# Patient Record
Sex: Male | Born: 1990 | Race: White | Hispanic: No | Marital: Married | Smoking: Former smoker
Health system: Southern US, Community
[De-identification: ages and names within clinical notes are randomized; demographics above are authoritative.]

## PROBLEM LIST (undated history)

## (undated) ENCOUNTER — Inpatient Hospital Stay: Admission: RE | Payer: No Typology Code available for payment source | Source: Intra-hospital | Admitting: Psychiatry

## (undated) DIAGNOSIS — F1990 Other psychoactive substance use, unspecified, uncomplicated: Secondary | ICD-10-CM

## (undated) DIAGNOSIS — T7840XA Allergy, unspecified, initial encounter: Secondary | ICD-10-CM

## (undated) DIAGNOSIS — F909 Attention-deficit hyperactivity disorder, unspecified type: Secondary | ICD-10-CM

## (undated) DIAGNOSIS — F419 Anxiety disorder, unspecified: Secondary | ICD-10-CM

## (undated) HISTORY — DX: Allergy, unspecified, initial encounter: T78.40XA

---
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
## 2010-07-11 ENCOUNTER — Emergency Department: Payer: Self-pay | Admitting: Emergency Medicine

## 2010-12-22 ENCOUNTER — Emergency Department: Payer: Self-pay

## 2011-12-06 ENCOUNTER — Emergency Department: Payer: Self-pay | Admitting: Emergency Medicine

## 2012-02-28 ENCOUNTER — Emergency Department: Payer: Self-pay | Admitting: Emergency Medicine

## 2012-04-02 ENCOUNTER — Emergency Department (HOSPITAL_COMMUNITY)
Admission: EM | Admit: 2012-04-02 | Discharge: 2012-04-03 | Disposition: A | Discharge status: Home / Self Care | Payer: Self-pay | Attending: Emergency Medicine | Admitting: Emergency Medicine

## 2012-04-02 ENCOUNTER — Encounter (HOSPITAL_COMMUNITY): Payer: Self-pay | Admitting: *Deleted

## 2012-04-02 DIAGNOSIS — F411 Generalized anxiety disorder: Secondary | ICD-10-CM | POA: Insufficient documentation

## 2012-04-02 DIAGNOSIS — F1910 Other psychoactive substance abuse, uncomplicated: Secondary | ICD-10-CM

## 2012-04-02 DIAGNOSIS — F17200 Nicotine dependence, unspecified, uncomplicated: Secondary | ICD-10-CM | POA: Insufficient documentation

## 2012-04-02 DIAGNOSIS — F419 Anxiety disorder, unspecified: Secondary | ICD-10-CM

## 2012-04-02 DIAGNOSIS — F1210 Cannabis abuse, uncomplicated: Secondary | ICD-10-CM | POA: Insufficient documentation

## 2012-04-02 DIAGNOSIS — Z8659 Personal history of other mental and behavioral disorders: Secondary | ICD-10-CM | POA: Insufficient documentation

## 2012-04-02 DIAGNOSIS — F1410 Cocaine abuse, uncomplicated: Secondary | ICD-10-CM | POA: Insufficient documentation

## 2012-04-02 DIAGNOSIS — R443 Hallucinations, unspecified: Secondary | ICD-10-CM | POA: Insufficient documentation

## 2012-04-02 HISTORY — DX: Attention-deficit hyperactivity disorder, unspecified type: F90.9

## 2012-04-02 HISTORY — DX: Anxiety disorder, unspecified: F41.9

## 2012-04-02 LAB — URINALYSIS, ROUTINE W REFLEX MICROSCOPIC
Bilirubin Urine: NEGATIVE
Glucose, UA: 250 mg/dL — AB
Hgb urine dipstick: NEGATIVE
Ketones, ur: NEGATIVE mg/dL
Leukocytes, UA: NEGATIVE
Nitrite: NEGATIVE
Protein, ur: NEGATIVE mg/dL
Specific Gravity, Urine: 1.024 (ref 1.005–1.030)
Urobilinogen, UA: 0.2 mg/dL (ref 0.0–1.0)
pH: 6 (ref 5.0–8.0)

## 2012-04-02 LAB — RAPID URINE DRUG SCREEN, HOSP PERFORMED
Amphetamines: NOT DETECTED
Barbiturates: NOT DETECTED
Benzodiazepines: NOT DETECTED
Cocaine: POSITIVE — AB
Opiates: NOT DETECTED
Tetrahydrocannabinol: POSITIVE — AB

## 2012-04-02 LAB — COMPREHENSIVE METABOLIC PANEL
Albumin: 4.2 g/dL (ref 3.5–5.2)
BUN: 14 mg/dL (ref 6–23)
Calcium: 9.7 mg/dL (ref 8.4–10.5)
GFR calc Af Amer: >90 mL/min (ref >90)
Glucose, Bld: 91 mg/dL (ref 70–99)
Sodium: 132 mEq/L — ABNORMAL LOW (ref 135–145)
Total Protein: 7.5 g/dL (ref 6.0–8.3)

## 2012-04-02 LAB — CBC WITH DIFFERENTIAL/PLATELET
Basophils Absolute: 0.1 K/uL (ref 0.0–0.1)
Basophils Relative: 0 % (ref 0–1)
Eosinophils Absolute: 0.1 10*3/uL (ref 0.0–0.7)
Eosinophils Relative: 1 % (ref 0–5)
HCT: 45.7 % (ref 39.0–52.0)
Hemoglobin: 16.2 g/dL (ref 13.0–17.0)
Lymphocytes Relative: 6 % — ABNORMAL LOW (ref 12–46)
Lymphs Abs: 0.9 10*3/uL (ref 0.7–4.0)
MCH: 32.5 pg (ref 26.0–34.0)
MCHC: 35.4 g/dL (ref 30.0–36.0)
MCV: 91.8 fL (ref 78.0–100.0)
Monocytes Absolute: 0.8 K/uL (ref 0.1–1.0)
Monocytes Relative: 6 % (ref 3–12)
Neutro Abs: 12.4 K/uL — ABNORMAL HIGH (ref 1.7–7.7)
Neutrophils Relative %: 87 % — ABNORMAL HIGH (ref 43–77)
Platelets: 292 10*3/uL (ref 150–400)
RBC: 4.98 MIL/uL (ref 4.22–5.81)
RDW: 12.1 % (ref 11.5–15.5)
WBC: 14.4 K/uL — ABNORMAL HIGH (ref 4.0–10.5)

## 2012-04-02 LAB — COMPREHENSIVE METABOLIC PANEL WITH GFR
ALT: 9 U/L (ref 0–53)
AST: 17 U/L (ref 0–37)
Alkaline Phosphatase: 85 U/L (ref 39–117)
CO2: 28 meq/L (ref 19–32)
Chloride: 96 meq/L (ref 96–112)
Creatinine, Ser: 1.14 mg/dL (ref 0.50–1.35)
GFR calc non Af Amer: >90 mL/min (ref >90)
Potassium: 3.8 meq/L (ref 3.5–5.1)
Total Bilirubin: 0.3 mg/dL (ref 0.3–1.2)

## 2012-04-02 LAB — SALICYLATE LEVEL: Salicylate Lvl: <2 mg/dL — ABNORMAL LOW (ref 2.8–20.0)

## 2012-04-02 LAB — ACETAMINOPHEN LEVEL: Acetaminophen (Tylenol), Serum: <15 ug/mL (ref 10–30)

## 2012-04-02 LAB — ETHANOL: Alcohol, Ethyl (B): <11 mg/dL (ref 0–11)

## 2012-04-02 MED ORDER — NICOTINE 21 MG/24HR TD PT24: 21.0000 mg | MEDICATED_PATCH | Freq: Every day | TRANSDERMAL | Status: DC

## 2012-04-02 MED ORDER — ONDANSETRON HCL 8 MG PO TABS: 4.0000 mg | ORAL_TABLET | Freq: Three times a day (TID) | ORAL | Status: DC | PRN

## 2012-04-02 MED ORDER — LORAZEPAM 1 MG PO TABS
2.0000 mg | ORAL_TABLET | Freq: Once | ORAL | Status: AC
  Administered 2012-04-02: 2 mg via ORAL
  Filled 2012-04-02 (×2): qty 2

## 2012-04-02 MED ORDER — LORAZEPAM 1 MG PO TABS: 1.0000 mg | ORAL_TABLET | Freq: Three times a day (TID) | ORAL | Status: DC | PRN

## 2012-04-02 MED ORDER — IBUPROFEN 200 MG PO TABS: 600.0000 mg | ORAL_TABLET | Freq: Three times a day (TID) | ORAL | Status: DC | PRN

## 2012-04-02 MED ORDER — ALUM & MAG HYDROXIDE-SIMETH 200-200-20 MG/5ML PO SUSP: 30.0000 mL | ORAL | Status: DC | PRN

## 2012-04-02 NOTE — ED Notes (Signed)
Pt. wanded again per security.

## 2012-04-02 NOTE — ED Notes (Signed)
The pt wants to be detoxed from street drugs.  Last use of drugs today he smoked pot.  Last cocaine and others  Xanax vicodin and seroquel 2 days ago. No alcohol

## 2012-04-02 NOTE — ED Notes (Signed)
Pt mother - Faizan Geraci [phone].

## 2012-04-02 NOTE — ED Provider Notes (Addendum)
History     CSN: [id number]  Arrival date & time 04/02/12  1605   First MD Initiated Contact with Patient 04/02/12 1805      Chief Complaint  Patient presents with  . Psychiatric Evaluation    (Consider location/radiation/quality/duration/timing/severity/associated sxs/prior treatment) HPI Comments: Patient with history of bad anxiety as well as ADHD presents with family wishing help from drug abuse. Patient had been binging on cocaine and marijuana. He also smokes cigarettes and drinks occasionally. He reports he is quite high last night and this morning and that he had been having visual hallucinations for the first time over the past couple of days verified by the patient's mother. Patient awoke this morning and reports that he felt like he wished to stop using drugs.  Not having hallucinations now, no SI, HI.  Marland Kitchen  He feels anxious and shaky.  No HA, fevers, chills, coughing.    The history is provided by the patient and a relative.    Past Medical History  Diagnosis Date  . Anxiety   . ADHD (attention deficit hyperactivity disorder)     History reviewed. No pertinent past surgical history.  History reviewed. No pertinent family history.  History  Substance Use Topics  . Smoking status: Current Every Day Smoker  . Smokeless tobacco: Not on file  . Alcohol Use: Yes      Review of Systems  Constitutional: Negative for fever and chills.  HENT: Negative for congestion.   Respiratory: Negative for cough and shortness of breath.   Cardiovascular: Negative for chest pain.  Gastrointestinal: Negative for nausea, vomiting and diarrhea.  Musculoskeletal: Negative for myalgias and back pain.  Psychiatric/Behavioral: Positive for hallucinations. Negative for suicidal ideas. The patient is nervous/anxious.   All other systems reviewed and are negative.    Allergies  Penicillins  Home Medications  No current outpatient prescriptions on file.  BP 115/69  Pulse 92   Temp(Src) 98.4 F (36.9 C) (Oral)  Resp 16  SpO2 99%  Physical Exam  Nursing note and vitals reviewed. Constitutional: He is oriented to person, place, and time. He appears well-developed and well-nourished.  HENT:  Head: Normocephalic and atraumatic.  Eyes: EOM are normal. Pupils are equal, round, and reactive to light.  Neck: Normal range of motion. Neck supple.  Cardiovascular: Normal rate and regular rhythm.   No murmur heard. Pulmonary/Chest: Effort normal. No respiratory distress.  Abdominal: Soft. He exhibits no distension. There is no tenderness.  Musculoskeletal: He exhibits no edema and no tenderness.  Neurological: He is alert and oriented to person, place, and time.  Skin: Skin is warm.  Psychiatric: He has a normal mood and affect.    ED Course  Procedures (including critical care time)  Labs Reviewed  URINALYSIS, ROUTINE W REFLEX MICROSCOPIC - Abnormal; Notable for the following:    Glucose, UA 250 (*)    All other components within normal limits  URINE RAPID DRUG SCREEN (HOSP PERFORMED) - Abnormal; Notable for the following:    Cocaine POSITIVE (*)    Tetrahydrocannabinol POSITIVE (*)    All other components within normal limits  CBC WITH DIFFERENTIAL - Abnormal; Notable for the following:    WBC 14.4 (*)    Neutrophils Relative 87 (*)    Neutro Abs 12.4 (*)    Lymphocytes Relative 6 (*)    All other components within normal limits  COMPREHENSIVE METABOLIC PANEL - Abnormal; Notable for the following:    Sodium 132 (*)    All other  components within normal limits  SALICYLATE LEVEL - Abnormal; Notable for the following:    Salicylate Lvl <2.0 (*)    All other components within normal limits  ETHANOL  ACETAMINOPHEN LEVEL   No results found.   1. Polysubstance abuse   2. Anxiety     Room air saturation is 98% and I interpret this to be normal.  Impression:  polysubstance abuse. Anxiety   MDM   Patient is not suicidal, no longer actively  hallucinating and I do not feel that there is an emergent psychiatric need for admission. His minor withdrawal symptoms I do not feel are a significant medical danger. Initially he was hypertensive and tachycardic I think was more do to his cocaine use and anxiety. I suspect his vital signs were normalized. I will offer him some by mouth Ativan here in emergency department. I spoken to [place] with the ACT team to see if the patient may qualify for inpatient programs versus being discharged for outpatient.  Pt is otherwise medically cleared.          Gavin Pound. Oletta Lamas, MD 04/03/12 Burna Mortimer  Gavin Pound. Oletta Lamas, MD 04/03/12 1610

## 2012-04-02 NOTE — ED Notes (Signed)
Patient must be laying down in order to draw blood.  Draw was attempted but due to patient present with shock, was stopped.  Patient prefers an IV blood draw if an IV is needed.

## 2012-04-02 NOTE — ED Notes (Addendum)
Pt request something for anxiety that is not ativan.  Pt refused to take ativan at this time.

## 2012-04-02 NOTE — ED Notes (Signed)
The pt reports that he was tripping earlier today

## 2012-04-03 NOTE — ED Notes (Signed)
Pt states understanding of discharge instructions 

## 2012-04-03 NOTE — BH Assessment (Signed)
Assessment Note   Manuel Richards is an 22 y.o. male.  Patient reports that he wanted detox from "everything."  Patient has been abusing ETOH, pain pills (xanax, vicodin, percocet, soma), marijuana, cocaine.  Patient says that amounts of the pain pills can vary according to money and availability.  Patient reports that on Thursday, 02/13 he used a lot of cocaine and to help him come off that high he took pain medications.  Patient reports that he does not remember much of the next two days after that.  He said that this was his sign that he needed to get help.  Patient has been using a variety of substances for many years.  He does not qualify at the present time for detox because he has been right at three days from using anything (with exception of ETOH) and he reports no withdrawal symptoms.  Patient has no HI, SI or A/V hallucinations.  Patient does want to apply for a rehabilitation bed at [facility] or [facility].  It was explained to him that those places only do rehab bed applications during business hours on weekdays.  Patient said that he would follow up on referral information given to him.  Patient care was discussed with Dr. Patria Mane and he agreed patient could be discharged. Axis I: Anxiety Disorder NOS and 304.80 Polysubstance dependence Axis II: Deferred Axis III:  Past Medical History  Diagnosis Date  . Anxiety   . ADHD (attention deficit hyperactivity disorder)    Axis IV: economic problems, occupational problems, problems related to legal system/crime and problems related to social environment Axis V: 41-50 serious symptoms  Past Medical History:  Past Medical History  Diagnosis Date  . Anxiety   . ADHD (attention deficit hyperactivity disorder)     History reviewed. No pertinent past surgical history.  Family History: History reviewed. No pertinent family history.  Social History:  reports that he has been smoking.  He does not have any smokeless tobacco history on file. He reports that   drinks alcohol. He reports that he uses illicit drugs (Cocaine and Marijuana).  Additional Social History:  Alcohol / Drug Use Pain Medications: Pt abusing pain medications such as vicodin, percocets, soma, xanax. Prescriptions: Pt is not currently prescribed anything. Over the Counter: Patient denies. History of alcohol / drug use?: Yes Longest period of sobriety (when/how long): When in jail for a few months. Negative Consequences of Use: Personal relationships Substance #1 Name of Substance 1: ETOH 1 - Age of First Use: 22 years of age 51 - Amount (size/oz): One 40 oz per day 1 - Frequency: Daily use 1 - Duration: Last two months at that rate. 1 - Last Use / Amount: 02/15 drank one beer Substance #2 Name of Substance 2: Pain pills which include: soma, vicodine, xanax, percocets 2 - Age of First Use: 22 years old 2 - Amount (size/oz): Amount varies according to funds and availability 2 - Frequency: Will use daily if available 2 - Duration: Last 5 years 2 - Last Use / Amount: 02/13 Substance #3 Name of Substance 3: Cocaine 3 - Age of First Use: 22 years of age 7 - Amount (size/oz): One half to one gram every 1-2 days 3 - Frequency: Every 1-2 days 3 - Duration: Last two months 3 - Last Use / Amount: 02/13 Substance #4 Name of Substance 4: Marijuana 4 - Age of First Use: 22 years of age 70 - Amount (size/oz): 2 grams per day 4 - Frequency: Daily use 4 -  Duration: 3 years 4 - Last Use / Amount: 02/13  CIWA: CIWA-Ar BP: 115/69 mmHg Pulse Rate: 92 COWS:    Allergies:  Allergies  Allergen Reactions  . Penicillins Other (See Comments)    Unknown- reaction many years ago    Home Medications:  (Not in a hospital admission)  OB/GYN Status:  No LMP for male patient.  General Assessment Data Location of Assessment: [facility] ED ACT Assessment: Yes Living Arrangements: Parent (Lives with mother) Can pt return to current living arrangement?: Yes Admission Status: Voluntary Is  patient capable of signing voluntary admission?: Yes Transfer from: Acute Hospital Referral Source: Self/Family/Friend     Risk to self Suicidal Ideation: No Suicidal Intent: No Is patient at risk for suicide?: No Suicidal Plan?: No Access to Means: No What has been your use of drugs/alcohol within the last 12 months?: Daily use of substances Previous Attempts/Gestures: No How many times?: 0 Other Self Harm Risks: SA issues Triggers for Past Attempts: None known Intentional Self Injurious Behavior: None Family Suicide History: No Recent stressful life event(s): Other (Comment) (Pt could not identify a single stressor) Persecutory voices/beliefs?: Yes Depression: Yes Depression Symptoms: Despondent;Insomnia;Isolating;Loss of interest in usual pleasures;Feeling worthless/self pity Substance abuse history and/or treatment for substance abuse?: Yes Suicide prevention information given to non-admitted patients: Not applicable  Risk to Others Homicidal Ideation: No Thoughts of Harm to Others: No Current Homicidal Intent: No Current Homicidal Plan: No Access to Homicidal Means: No Identified Victim: No one History of harm to others?: No Assessment of Violence: None Noted Violent Behavior Description: None Does patient have access to weapons?: No Criminal Charges Pending?: Yes Describe Pending Criminal Charges:  Possession of THC Does patient have a court date: Yes Court Date: 04/07/12  Psychosis Hallucinations: None noted Delusions: None noted  Mental Status Report Appear/Hygiene: Disheveled Eye Contact: Fair Motor Activity: Freedom of movement;Restlessness Speech: Logical/coherent Level of Consciousness: Quiet/awake Mood: Anxious;Sad Affect: Anxious;Depressed Anxiety Level: Severe Thought Processes: Coherent;Relevant Judgement: Impaired Orientation: Person;Place;Time;Situation Obsessive Compulsive Thoughts/Behaviors: Minimal  Cognitive Functioning Concentration:  Decreased Memory: Recent Impaired;Remote Intact IQ: Average Insight: Fair Impulse Control: Poor Appetite: Poor Weight Loss: 0 Weight Gain: 0 Sleep: Decreased Total Hours of Sleep:  (<4H/D) Vegetative Symptoms: None  ADLScreening [facility] Assessment Services) Patient's cognitive ability adequate to safely complete daily activities?: Yes Patient able to express need for assistance with ADLs?: Yes Independently performs ADLs?: Yes (appropriate for developmental age)  Abuse/Neglect [facility]) Physical Abuse: Denies Verbal Abuse: Denies Sexual Abuse: Denies  Prior Inpatient Therapy Prior Inpatient Therapy: No Prior Therapy Dates: None Prior Therapy Facilty/Provider(s): None Reason for Treatment: None  Prior Outpatient Therapy Prior Outpatient Therapy: No Prior Therapy Dates: N/A Prior Therapy Facilty/Provider(s): N/A Reason for Treatment: N/A  ADL Screening (condition at time of admission) Patient's cognitive ability adequate to safely complete daily activities?: Yes Patient able to express need for assistance with ADLs?: Yes Independently performs ADLs?: Yes (appropriate for developmental age) Weakness of Legs: None Weakness of Arms/Hands: None  Home Assistive Devices/Equipment Home Assistive Devices/Equipment: None    Abuse/Neglect Assessment (Assessment to be complete while patient is alone) Physical Abuse: Denies Verbal Abuse: Denies Sexual Abuse: Denies Exploitation of patient/patient's resources: Denies Self-Neglect: Denies     Merchant navy officer (For Healthcare) Advance Directive: Patient does not have advance directive;Patient would not like information    Additional Information 1:1 In Past 12 Months?: No CIRT Risk: No Elopement Risk: No Does patient have medical clearance?: Yes     Disposition:  Disposition Disposition of Patient:  Outpatient treatment;Referred to Type of outpatient treatment: Adult Patient referred to: [facility] (Pt to contact on Monday  for rehab bed availability.)  On Site Evaluation by:   Reviewed with Physician:  Dr. Stanford Breed, Berna Spare Ray 04/03/2012 4:48 AM

## 2012-06-28 ENCOUNTER — Emergency Department: Payer: Self-pay | Admitting: Emergency Medicine

## 2012-09-29 ENCOUNTER — Emergency Department: Payer: Self-pay | Admitting: Emergency Medicine

## 2013-01-06 ENCOUNTER — Emergency Department: Payer: Self-pay | Admitting: Emergency Medicine

## 2013-01-06 LAB — CBC WITH DIFFERENTIAL/PLATELET
Eosinophil #: 0.1 10*3/uL (ref 0.0–0.7)
Eosinophil %: 0.7 %
HCT: 44.5 % (ref 40.0–52.0)
Lymphocyte %: 8.3 %
Monocyte #: 1.4 x10 3/mm — ABNORMAL HIGH (ref 0.2–1.0)
Monocyte %: 8.1 %
Neutrophil #: 14.2 10*3/uL — ABNORMAL HIGH (ref 1.4–6.5)
RBC: 4.84 10*6/uL (ref 4.40–5.90)
RDW: 12.9 % (ref 11.5–14.5)
WBC: 17.3 10*3/uL — ABNORMAL HIGH (ref 3.8–10.6)

## 2013-01-06 LAB — URINALYSIS, COMPLETE
Bacteria: NONE SEEN
Bilirubin,UR: NEGATIVE
Blood: NEGATIVE
Glucose,UR: NEGATIVE mg/dL (ref 0–75)
Ketone: NEGATIVE
Nitrite: NEGATIVE
Ph: 6 (ref 4.5–8.0)

## 2013-01-06 LAB — COMPREHENSIVE METABOLIC PANEL
Alkaline Phosphatase: 103 U/L
Anion Gap: 8 (ref 7–16)
Chloride: 104 mmol/L (ref 98–107)
Co2: 27 mmol/L (ref 21–32)
Creatinine: 0.95 mg/dL (ref 0.60–1.30)
EGFR (Non-African Amer.): >60
Glucose: 101 mg/dL — ABNORMAL HIGH (ref 65–99)
SGOT(AST): 18 U/L (ref 15–37)
SGPT (ALT): 22 U/L (ref 12–78)

## 2013-01-06 LAB — DRUG SCREEN, URINE
Amphetamines, Ur Screen: NEGATIVE (ref ?–1000)
Benzodiazepine, Ur Scrn: NEGATIVE (ref ?–200)
Cannabinoid 50 Ng, Ur ~~LOC~~: POSITIVE (ref ?–50)
Cocaine Metabolite,Ur ~~LOC~~: NEGATIVE (ref ?–300)
Methadone, Ur Screen: NEGATIVE (ref ?–300)
Opiate, Ur Screen: NEGATIVE (ref ?–300)

## 2013-01-06 LAB — MONONUCLEOSIS SCREEN: Mono Test: NEGATIVE

## 2013-01-09 LAB — BETA STREP CULTURE(ARMC)

## 2013-02-19 ENCOUNTER — Emergency Department: Payer: Self-pay | Admitting: Internal Medicine

## 2013-02-19 LAB — RAPID INFLUENZA A&B ANTIGENS (ARMC ONLY)

## 2013-03-16 ENCOUNTER — Emergency Department: Payer: Self-pay | Admitting: Internal Medicine

## 2013-04-30 ENCOUNTER — Emergency Department: Payer: Self-pay | Admitting: Emergency Medicine

## 2013-05-01 ENCOUNTER — Emergency Department: Payer: Self-pay | Admitting: Emergency Medicine

## 2014-01-29 ENCOUNTER — Emergency Department: Payer: Self-pay | Admitting: Emergency Medicine

## 2014-12-18 ENCOUNTER — Emergency Department
Admission: EM | Admit: 2014-12-18 | Discharge: 2014-12-19 | Disposition: A | Discharge status: Other Acute Inpatient Hospital | Payer: No Typology Code available for payment source | Attending: Emergency Medicine | Admitting: Emergency Medicine

## 2014-12-18 ENCOUNTER — Emergency Department: Payer: Self-pay

## 2014-12-18 ENCOUNTER — Encounter: Payer: Self-pay | Admitting: Emergency Medicine

## 2014-12-18 DIAGNOSIS — F1110 Opioid abuse, uncomplicated: Secondary | ICD-10-CM | POA: Insufficient documentation

## 2014-12-18 DIAGNOSIS — F1210 Cannabis abuse, uncomplicated: Secondary | ICD-10-CM | POA: Insufficient documentation

## 2014-12-18 DIAGNOSIS — F1220 Cannabis dependence, uncomplicated: Secondary | ICD-10-CM

## 2014-12-18 DIAGNOSIS — F1120 Opioid dependence, uncomplicated: Secondary | ICD-10-CM

## 2014-12-18 DIAGNOSIS — R4182 Altered mental status, unspecified: Secondary | ICD-10-CM | POA: Insufficient documentation

## 2014-12-18 DIAGNOSIS — F19951 Other psychoactive substance use, unspecified with psychoactive substance-induced psychotic disorder with hallucinations: Secondary | ICD-10-CM

## 2014-12-18 DIAGNOSIS — R61 Generalized hyperhidrosis: Secondary | ICD-10-CM | POA: Insufficient documentation

## 2014-12-18 DIAGNOSIS — Z880 Allergy status to penicillin: Secondary | ICD-10-CM | POA: Insufficient documentation

## 2014-12-18 DIAGNOSIS — Z720 Tobacco use: Secondary | ICD-10-CM | POA: Insufficient documentation

## 2014-12-18 DIAGNOSIS — F1910 Other psychoactive substance abuse, uncomplicated: Secondary | ICD-10-CM

## 2014-12-18 DIAGNOSIS — F29 Unspecified psychosis not due to a substance or known physiological condition: Secondary | ICD-10-CM | POA: Insufficient documentation

## 2014-12-18 DIAGNOSIS — F23 Brief psychotic disorder: Secondary | ICD-10-CM

## 2014-12-18 DIAGNOSIS — F1121 Opioid dependence, in remission: Secondary | ICD-10-CM

## 2014-12-18 DIAGNOSIS — R000 Tachycardia, unspecified: Secondary | ICD-10-CM | POA: Insufficient documentation

## 2014-12-18 LAB — URINALYSIS COMPLETE WITH MICROSCOPIC (ARMC ONLY)
Bacteria, UA: NONE SEEN
Bilirubin Urine: NEGATIVE
GLUCOSE, UA: NEGATIVE mg/dL
Leukocytes, UA: NEGATIVE
Nitrite: NEGATIVE
PROTEIN: 100 mg/dL — AB
Specific Gravity, Urine: 1.03 (ref 1.005–1.030)
pH: 6 (ref 5.0–8.0)

## 2014-12-18 LAB — CBC
HCT: 48.1 % (ref 40.0–52.0)
Hemoglobin: 16.3 g/dL (ref 13.0–18.0)
MCH: 31.1 pg (ref 26.0–34.0)
MCHC: 34 g/dL (ref 32.0–36.0)
MCV: 91.5 fL (ref 80.0–100.0)
PLATELETS: 382 10*3/uL (ref 150–440)
RBC: 5.25 MIL/uL (ref 4.40–5.90)
RDW: 12.6 % (ref 11.5–14.5)
WBC: 13.4 10*3/uL — ABNORMAL HIGH (ref 3.8–10.6)

## 2014-12-18 LAB — ACETAMINOPHEN LEVEL

## 2014-12-18 LAB — URINE DRUG SCREEN, QUALITATIVE (ARMC ONLY)
AMPHETAMINES, UR SCREEN: NOT DETECTED
BENZODIAZEPINE, UR SCRN: NOT DETECTED
Barbiturates, Ur Screen: NOT DETECTED
CANNABINOID 50 NG, UR ~~LOC~~: POSITIVE — AB
Cocaine Metabolite,Ur ~~LOC~~: NOT DETECTED
MDMA (ECSTASY) UR SCREEN: NOT DETECTED
Methadone Scn, Ur: NOT DETECTED
Opiate, Ur Screen: POSITIVE — AB
PHENCYCLIDINE (PCP) UR S: NOT DETECTED
TRICYCLIC, UR SCREEN: NOT DETECTED

## 2014-12-18 LAB — COMPREHENSIVE METABOLIC PANEL
ALBUMIN: 5.3 g/dL — AB (ref 3.5–5.0)
ALK PHOS: 71 U/L (ref 38–126)
ALT: 17 U/L (ref 17–63)
AST: 22 U/L (ref 15–41)
Anion gap: 11 (ref 5–15)
BUN: 21 mg/dL — ABNORMAL HIGH (ref 6–20)
CALCIUM: 9.7 mg/dL (ref 8.9–10.3)
CO2: 24 mmol/L (ref 22–32)
CREATININE: 1 mg/dL (ref 0.61–1.24)
Chloride: 102 mmol/L (ref 101–111)
GFR calc Af Amer: >60 mL/min (ref >60)
GFR calc non Af Amer: >60 mL/min (ref >60)
GLUCOSE: 147 mg/dL — AB (ref 65–99)
Potassium: 3.4 mmol/L — ABNORMAL LOW (ref 3.5–5.1)
SODIUM: 137 mmol/L (ref 135–145)
Total Bilirubin: 1 mg/dL (ref 0.3–1.2)
Total Protein: 8.4 g/dL — ABNORMAL HIGH (ref 6.5–8.1)

## 2014-12-18 LAB — SALICYLATE LEVEL

## 2014-12-18 LAB — ETHANOL

## 2014-12-18 MED ORDER — DIPHENHYDRAMINE HCL 50 MG/ML IJ SOLN
INTRAMUSCULAR | Status: AC
  Administered 2014-12-18: 50 mg
  Filled 2014-12-18: qty 1

## 2014-12-18 MED ORDER — SODIUM CHLORIDE 0.9 % IV SOLN
Freq: Once | INTRAVENOUS | Status: DC
Start: 2014-12-18 — End: 2014-12-18

## 2014-12-18 MED ORDER — LORAZEPAM 2 MG/ML IJ SOLN
INTRAMUSCULAR | Status: AC
  Administered 2014-12-18: 2 mg
  Filled 2014-12-18: qty 1

## 2014-12-18 MED ORDER — SODIUM CHLORIDE 0.9 % IV SOLN
1000.0000 mL | Freq: Once | INTRAVENOUS | Status: AC
  Administered 2014-12-18: 1000 mL via INTRAVENOUS

## 2014-12-18 MED ORDER — HALOPERIDOL LACTATE 5 MG/ML IJ SOLN
INTRAMUSCULAR | Status: AC
  Administered 2014-12-18: 5 mg
  Filled 2014-12-18: qty 1

## 2014-12-18 NOTE — ED Provider Notes (Signed)
[facility] Emergency Department Provider Note     Time seen: ----------------------------------------- 6:00 PM on 12/18/2014 -----------------------------------------  L5 caveat: Review of systems and history is difficult to obtain due to altered mental status  I have reviewed the triage vital signs and the nursing notes.   HISTORY  Chief Complaint Altered Mental Status    HPI Manuel Richards is a 24 y.o. male who presents ER for altered mental status. According to the girlfriend he's been acting where for days, reports he slept all day Friday and Saturday he woke up not making much sense. Girlfriend reports she's been getting worse every day, he is nonverbal on arrival here with twitching.   Past Medical History  Diagnosis Date  . Anxiety   . ADHD (attention deficit hyperactivity disorder)     There are no active problems to display for this patient.   History reviewed. No pertinent past surgical history.  Allergies Penicillins  Social History Social History  Substance Use Topics  . Smoking status: Current Every Day Smoker  . Smokeless tobacco: None  . Alcohol Use: Yes    Review of Systems Unknown at this time   ____________________________________________   PHYSICAL EXAM:  VITAL SIGNS: ED Triage Vitals  Enc Vitals Group     BP 12/18/14 1739 161/112 mmHg     Pulse Rate 12/18/14 1739 109     Resp 12/18/14 1739 16     Temp 12/18/14 1739 98.1 F (36.7 C)     Temp Source 12/18/14 1739 Oral     SpO2 12/18/14 1739 97 %     Weight 12/18/14 1739 120 lb (54.432 kg)     Height 12/18/14 1739 5\' 8"  (1.727 m)     Head Cir --      Peak Flow --      Pain Score --      Pain Loc --      Pain Edu? --      Excl. in GC? --     Constitutional: Alert but disoriented. Patient is nonverbal Eyes: Conjunctivae are normal. PERRL. Normal extraocular movements. ENT   Head: Normocephalic and atraumatic.   Nose: No  congestion/rhinnorhea.   Mouth/Throat: Mucous membranes are moist. Generalized poor dentition   Neck: No stridor. Cardiovascular: Rapid rate, regular rhythm. Normal and symmetric distal pulses are present in all extremities. No murmurs, rubs, or gallops. Respiratory: Normal respiratory effort without tachypnea nor retractions. Breath sounds are clear and equal bilaterally. No wheezes/rales/rhonchi. Gastrointestinal: Soft and nontender. No distention. No abdominal bruits.  Musculoskeletal: Nontender with normal range of motion in all extremities. No joint effusions.  No lower extremity tenderness nor edema. Neurologic:  Patient follows commands, but has resting tremor, eye fluttering, is nonverbal at this time. Skin:  Skin is warm, diaphoresis is noted. Psychiatric: Mood and behavior are abnormal. Patient remains nonverbal.  ____________________________________________  ED COURSE:  Pertinent labs & imaging results that were available during my care of the patient were reviewed by me and considered in my medical decision making (see chart for details). Altered mental status of unclear etiology. This likely drug-induced. ____________________________________________    LABS (pertinent positives/negatives)  Labs Reviewed  COMPREHENSIVE METABOLIC PANEL - Abnormal; Notable for the following:    Potassium 3.4 (*)    Glucose, Bld 147 (*)    BUN 21 (*)    Total Protein 8.4 (*)    Albumin 5.3 (*)    All other components within normal limits  CBC - Abnormal; Notable for  the following:    WBC 13.4 (*)    All other components within normal limits  URINALYSIS COMPLETEWITH MICROSCOPIC ([facility] ONLY) - Abnormal; Notable for the following:    Color, Urine YELLOW (*)    APPearance CLOUDY (*)    Ketones, ur 1+ (*)    Hgb urine dipstick 1+ (*)    Protein, ur 100 (*)    Squamous Epithelial / LPF 0-5 (*)    All other components within normal limits  URINE DRUG SCREEN, QUALITATIVE ([facility] ONLY) -  Abnormal; Notable for the following:    Opiate, Ur Screen POSITIVE (*)    Cannabinoid 50 Ng, Ur [place] POSITIVE (*)    All other components within normal limits  ACETAMINOPHEN LEVEL - Abnormal; Notable for the following:    Acetaminophen (Tylenol), Serum <10 (*)    All other components within normal limits  ETHANOL  SALICYLATE LEVEL    RADIOLOGY Images were viewed by me  CT head IMPRESSION: Diffuse mastoid opacification on the left. Mastoids on the right are clear. There is no intracranial mass, hemorrhage, or focal gray -white compartment lesions/acute appearing infarct. ____________________________________________  FINAL ASSESSMENT AND PLAN  Altered mental status, acute psychosis, polysubstance abuse  Plan: Patient with labs and imaging as dictated above. Patient appears to be acutely psychotic, patient thinks she is pregnant currently. He has not gotten back to his baseline according to his girlfriend. He also uses synthetic marijuana which may be the cause of his symptoms. He has been involuntarily committed, psychiatry consult was pending.   Emily Filbert, MD   Emily Filbert, MD 12/18/14 [phone]

## 2014-12-18 NOTE — ED Notes (Signed)
Patient assigned to appropriate care area. Patient oriented to unit/care area: Informed that, for their safety, care areas are designed for safety and monitored by security cameras at all times; and visiting hours explained to patient. Patient verbalizes understanding, and verbal contract for safety obtained, however the Pt is very psychotic and RN believes that he is unable to understand.

## 2014-12-18 NOTE — ED Notes (Signed)
Pt's girlfriend reports pt has been acting weird for a few days, reports pt slept all day Friday and then Saturday woke up not making much sense. Reports pt has been getting worse every day. Pt nonverbal in triage, eventually responds to pain. Pt with eyes twitching fast, quick hand movements. Girlfriend denies any drug use besides marijuana, denies alcohol, denies any psych hx.

## 2014-12-18 NOTE — ED Notes (Signed)
Pt transported to the BHU without any difficulty.  

## 2014-12-18 NOTE — ED Notes (Signed)
Mike at bedside to change pt out to Marketing executivepsych attire.

## 2014-12-18 NOTE — ED Notes (Signed)
Pt had a psychotic episode where he was speaking in incomplete sentences, word salad and flight of ideas. Pt was getting agitated therefore Roberts GaudyHenry RN called ED MD to request medication to prevent the Pt from hurting himself. Medication was administered and Roberts GaudyHenry RN stayed with the Pt in his room until the Pt started to calm down.

## 2014-12-19 ENCOUNTER — Inpatient Hospital Stay
Admission: EM | Admit: 2014-12-19 | Discharge: 2014-12-24 | DRG: 897 | Disposition: A | Discharge status: Home / Self Care | Payer: No Typology Code available for payment source | Source: Intra-hospital | Attending: Psychiatry | Admitting: Psychiatry

## 2014-12-19 DIAGNOSIS — F19951 Other psychoactive substance use, unspecified with psychoactive substance-induced psychotic disorder with hallucinations: Secondary | ICD-10-CM | POA: Diagnosis present

## 2014-12-19 DIAGNOSIS — F17201 Nicotine dependence, unspecified, in remission: Secondary | ICD-10-CM | POA: Diagnosis present

## 2014-12-19 DIAGNOSIS — F1121 Opioid dependence, in remission: Secondary | ICD-10-CM | POA: Diagnosis present

## 2014-12-19 DIAGNOSIS — F19959 Other psychoactive substance use, unspecified with psychoactive substance-induced psychotic disorder, unspecified: Principal | ICD-10-CM | POA: Diagnosis present

## 2014-12-19 DIAGNOSIS — F909 Attention-deficit hyperactivity disorder, unspecified type: Secondary | ICD-10-CM | POA: Diagnosis present

## 2014-12-19 DIAGNOSIS — N390 Urinary tract infection, site not specified: Secondary | ICD-10-CM | POA: Diagnosis present

## 2014-12-19 DIAGNOSIS — F1120 Opioid dependence, uncomplicated: Secondary | ICD-10-CM | POA: Diagnosis not present

## 2014-12-19 DIAGNOSIS — R410 Disorientation, unspecified: Secondary | ICD-10-CM | POA: Diagnosis present

## 2014-12-19 DIAGNOSIS — Z7289 Other problems related to lifestyle: Secondary | ICD-10-CM | POA: Diagnosis not present

## 2014-12-19 DIAGNOSIS — F17200 Nicotine dependence, unspecified, uncomplicated: Secondary | ICD-10-CM | POA: Diagnosis present

## 2014-12-19 DIAGNOSIS — Z880 Allergy status to penicillin: Secondary | ICD-10-CM

## 2014-12-19 DIAGNOSIS — R441 Visual hallucinations: Secondary | ICD-10-CM | POA: Diagnosis present

## 2014-12-19 DIAGNOSIS — F1220 Cannabis dependence, uncomplicated: Secondary | ICD-10-CM | POA: Diagnosis present

## 2014-12-19 DIAGNOSIS — F202 Catatonic schizophrenia: Secondary | ICD-10-CM | POA: Diagnosis present

## 2014-12-19 DIAGNOSIS — G4700 Insomnia, unspecified: Secondary | ICD-10-CM | POA: Diagnosis present

## 2014-12-19 LAB — LIPID PANEL
CHOLESTEROL: 182 mg/dL (ref 0–200)
HDL: 41 mg/dL (ref >40)
LDL CALC: 129 mg/dL — AB (ref 0–99)
TRIGLYCERIDES: 62 mg/dL (ref <150)
Total CHOL/HDL Ratio: 4.4 RATIO
VLDL: 12 mg/dL (ref 0–40)

## 2014-12-19 LAB — TSH: TSH: 0.922 u[IU]/mL (ref 0.350–4.500)

## 2014-12-19 MED ORDER — LORAZEPAM 2 MG/ML IJ SOLN
2.0000 mg | INTRAMUSCULAR | Status: DC | PRN
  Filled 2014-12-19: qty 1

## 2014-12-19 MED ORDER — ALUM & MAG HYDROXIDE-SIMETH 200-200-20 MG/5ML PO SUSP: 30.0000 mL | ORAL | Status: DC | PRN

## 2014-12-19 MED ORDER — HALOPERIDOL 0.5 MG PO TABS
1.0000 mg | ORAL_TABLET | Freq: Four times a day (QID) | ORAL | Status: DC
  Administered 2014-12-19: 1 mg via ORAL
  Filled 2014-12-19: qty 2

## 2014-12-19 MED ORDER — DIPHENHYDRAMINE HCL 50 MG/ML IJ SOLN
INTRAMUSCULAR | Status: AC
  Administered 2014-12-19: 50 mg
  Filled 2014-12-19: qty 1

## 2014-12-19 MED ORDER — LOPERAMIDE HCL 2 MG PO CAPS: 2.0000 mg | ORAL_CAPSULE | ORAL | Status: DC | PRN

## 2014-12-19 MED ORDER — LOPERAMIDE HCL 2 MG PO CAPS
ORAL_CAPSULE | ORAL | Status: AC
  Administered 2014-12-19: 2 mg via ORAL
  Filled 2014-12-19: qty 1

## 2014-12-19 MED ORDER — LORAZEPAM 2 MG/ML IJ SOLN: 2.0000 mg | INTRAMUSCULAR | Status: DC | PRN

## 2014-12-19 MED ORDER — LOPERAMIDE HCL 2 MG PO CAPS
2.0000 mg | ORAL_CAPSULE | Freq: Once | ORAL | Status: AC
  Administered 2014-12-19: 2 mg via ORAL
  Filled 2014-12-19: qty 1

## 2014-12-19 MED ORDER — LOPERAMIDE HCL 2 MG PO CAPS
2.0000 mg | ORAL_CAPSULE | Freq: Once | ORAL | Status: DC
Start: 2014-12-19 — End: 2014-12-19
  Administered 2014-12-19: 2 mg via ORAL

## 2014-12-19 MED ORDER — ACETAMINOPHEN 325 MG PO TABS: 650.0000 mg | ORAL_TABLET | Freq: Four times a day (QID) | ORAL | Status: DC | PRN

## 2014-12-19 MED ORDER — ZIPRASIDONE MESYLATE 20 MG IM SOLR
INTRAMUSCULAR | Status: AC
  Filled 2014-12-19: qty 20

## 2014-12-19 MED ORDER — HALOPERIDOL 0.5 MG PO TABS
1.0000 mg | ORAL_TABLET | Freq: Four times a day (QID) | ORAL | Status: DC
  Administered 2014-12-19 – 2014-12-20 (×3): 1 mg via ORAL
  Filled 2014-12-19 (×4): qty 2

## 2014-12-19 MED ORDER — ZIPRASIDONE MESYLATE 20 MG IM SOLR
20.0000 mg | Freq: Once | INTRAMUSCULAR | Status: AC
  Administered 2014-12-19: 20 mg via INTRAMUSCULAR

## 2014-12-19 MED ORDER — LORAZEPAM 1 MG PO TABS
1.0000 mg | ORAL_TABLET | Freq: Four times a day (QID) | ORAL | Status: DC
  Administered 2014-12-19 – 2014-12-20 (×3): 1 mg via ORAL
  Filled 2014-12-19 (×3): qty 1

## 2014-12-19 MED ORDER — DIPHENHYDRAMINE HCL 50 MG/ML IJ SOLN
50.0000 mg | INTRAMUSCULAR | Status: DC | PRN
  Administered 2014-12-20 (×2): 50 mg via INTRAMUSCULAR
  Filled 2014-12-19 (×2): qty 1

## 2014-12-19 MED ORDER — LORAZEPAM 1 MG PO TABS
1.0000 mg | ORAL_TABLET | Freq: Four times a day (QID) | ORAL | Status: DC
  Administered 2014-12-19: 1 mg via ORAL
  Filled 2014-12-19: qty 1

## 2014-12-19 MED ORDER — MAGNESIUM HYDROXIDE 400 MG/5ML PO SUSP: 30.0000 mL | Freq: Every day | ORAL | Status: DC | PRN

## 2014-12-19 NOTE — ED Notes (Signed)
ED BHU PLACEMENT JUSTIFICATION Is the patient under IVC or is there intent for IVC: Yes.   Is the patient medically cleared: Yes.   Is there vacancy in the ED BHU: Yes.   Is the population mix appropriate for patient: Yes.   Is the patient awaiting placement in inpatient or outpatient setting: No. Has the patient had a psychiatric consult: No. Survey of unit performed for contraband, proper placement and condition of furniture, tampering with fixtures in bathroom, shower, and each patient room: Yes.  ; Findings:  APPEARANCE/BEHAVIOR Pt presents as actively psychotic with A/V hallucinations. Pt makes odd gestures to no one in particular. Pt noted to be talking to himself. Pt ambulates around his room and ventures into other patient's rooms. Pt intermittently can answer staff with questions with both appropriate and inappropriate answers.  NEURO ASSESSMENT Orientation: Pt unable to answer questions, states that is is 2012.  Hallucinations: Yes.  Auditory Hallucinations, Visual Hallucinations and Tactile Hallucinations Speech: Normal Gait: normal RESPIRATORY ASSESSMENT Normal expansion.  Clear to auscultation.  No rales, rhonchi, or wheezing. CARDIOVASCULAR ASSESSMENT regular rate and rhythm, S1, S2 normal, no murmur, click, rub or gallop GASTROINTESTINAL ASSESSMENT soft, nontender, BS WNL, no r/g EXTREMITIES normal strength, tone, and muscle mass PLAN OF CARE Provide calm/safe environment. Vital signs assessed twice daily. ED BHU Assessment once each 12-hour shift. Collaborate with intake RN daily or as condition indicates. Assure the ED provider has rounded once each shift. Provide and encourage hygiene. Provide redirection as needed. Assess for escalating behavior; address immediately and inform ED provider.  Assess family dynamic and appropriateness for visitation as needed: Yes.  ; If necessary, describe findings:  Educate the patient/family about BHU procedures/visitation: Yes.  ; If  necessary, describe findings:

## 2014-12-19 NOTE — ED Notes (Signed)
Patient assigned to appropriate care area. Patient oriented to unit/care area: Informed that, for their safety, care areas are designed for safety and monitored by security cameras at all times; and visiting hours explained to patient. Patient verbalizes understanding, and verbal contract for safety obtained. 

## 2014-12-19 NOTE — Progress Notes (Signed)
Dystonia, Rigid, akathisia, stiffness of muscle, shaking and tense in muscles; assisted up to sitting position, Diphenhydramine 50 mg IM, Left gluteal given for EPS. Will continue to monitor responsiveness to medication.

## 2014-12-19 NOTE — ED Notes (Signed)
NAD Noted at this time. Pt pacing around his room. Pt cooperative and easily re-directed by staff. Pt continues to hallucinate. Will continue with 15 min safety checks.

## 2014-12-19 NOTE — Progress Notes (Addendum)
Admission note:  D; patient admitted to the unit for schizophreniform catatonia.  Patient was brought in by girlfriend.  Patient is having visual and auditory hallucinations at this time.  Patient is unable to express any information needed for the admission.  Patient has brief moments of clarity.  Patient has a hx of substance abuse.  Patient has a hx of smoking.  Patient is disheveled and presenting with a flat affect.  Patient has no history of SI or HI.  Patient is compliant with medications prescribed at this time. Patient wondering the milieu.  Patient takes several times to redirect.  Patient in no distress at this time. A: skin and belongings search completed with no contraband found.  Patient shown around the unit.  Medications given as prescribed.  q 15 min checks done.  R: patient no receptive of information and shows no evidence of learning.

## 2014-12-19 NOTE — ED Notes (Signed)
Pt urinated on a conner of the room. Sherilyn CooterHenry RN and ED tech cleaned the room and changed the Pt's clothing. Pt was place on bed.

## 2014-12-19 NOTE — ED Notes (Signed)
NO physical distress noted at this time. Pt sitting up on side of bed, continues to have conversations with himself. Will continue to monitor with 14 min safety rounds.

## 2014-12-19 NOTE — ED Notes (Signed)
Pt currently sitting on side of bed and talking to himself. A/V hallucinations noted at this time. Will continue to monitor with 15 minute safety checks.

## 2014-12-19 NOTE — ED Notes (Signed)
Pt noted to be sitting on edge of bed at this time. Visualized in no physical distress. Pt noted to be rubbing his pants as if to get something off, sometimes able to answer staff questions and sometimes unable to. Pt calm and at this time.

## 2014-12-19 NOTE — Progress Notes (Signed)
Untwined now, ambulating freely around in his room, looking at himself in the mirror and spitting at his own image, talking to himself, nonsensical, disorganized, confused and disoriented; Clinical research associatewriter with patient for safety.

## 2014-12-19 NOTE — BH Assessment (Signed)
Assessment Note  Manuel Richards is an 24 y.o. male. who presented voluntarily to [facility] ED for bizarre behavior for the past couple of days . Pt appears to be responding to internal stimuli both auditory and visual.  Pt stated his recent for admission to the hospital, "I thought my wife was going to have these babies, but now I have to have them. They are in the front and the back."  When asked if he was married, Pt stated, "No, I've got a girlfriend."  During the assessment, the Pt appeared to be looking for something under the covers and on his hands.  Pt would not respond to several of the questions, he would just give a blank stare.  Pt made several comments that were unable to be understood by the writer, his voice was low and the words were not recognizable.   Pt denies any mental health history. Pt denies suicidal thoughts.  Pt denies any self injurious behaviors. Pt denies homicidal ideation or history of violence. Pt denies any history of auditory or visual hallucinations, however it  Appears he is responding to A and V hallucinations. Pt denies any current substance use and/or abuse, however his labs showed positive for Cannabinoids and Opiates.   Pt is dressed in hospital scrubs, not oriented to self, place, time or situation. Pt thought the year was 2012.  Pt's exhibited tremors in his eye lids and hands during assessment and was restless. Eye contact is poor, Pt would just blankly stare past the Clinical research associate. Pt's mood is unable to be assessed and anxious and affect preoccupied. Thought process is incoherent and irrelevant.   Diagnosis: Psychosis   Past Medical History:  Past Medical History  Diagnosis Date  . Anxiety   . ADHD (attention deficit hyperactivity disorder)     History reviewed. No pertinent past surgical history.  Family History: No family history on file.  Social History:  reports that he has been smoking.  He does not have any smokeless tobacco history on file.  He reports that he drinks alcohol. He reports that he uses illicit drugs (Cocaine and Marijuana).  Additional Social History:  Alcohol / Drug Use Pain Medications: Unable to Assess Prescriptions: Unable to Assess Over the Counter: Unable to Assess Longest period of sobriety (when/how long): Unable to Assess  CIWA: CIWA-Ar BP: 139/85 mmHg Pulse Rate: 96 COWS:    Allergies:  Allergies  Allergen Reactions  . Penicillins Other (See Comments)    Reaction:  Unknown; childhood reaction     Home Medications:  (Not in a hospital admission)  OB/GYN Status:  No LMP for male patient.  General Assessment Data Location of Assessment: [facility] ED TTS Assessment: In system Is this a Tele or Face-to-Face Assessment?: Face-to-Face Is this an Initial Assessment or a Re-assessment for this encounter?: Initial Assessment Marital status: Single Maiden name: N/a Is patient pregnant?: No Pregnancy Status: No Living Arrangements: Spouse/significant other Can pt return to current living arrangement?: Yes Admission Status: Voluntary Referral Source: Self/Family/Friend Insurance type: None     Crisis Care Plan Living Arrangements: Spouse/significant other Name of Psychiatrist: None reported Name of Therapist: None reported  Education Status Is patient currently in school?: No Current Grade: N/a Highest grade of school patient has completed: 102 Name of school: N/a  Risk to self with the past 6 months Suicidal Ideation: No Has patient been a risk to self within the past 6 months prior to admission? : No Suicidal Intent: No Has patient had  any suicidal intent within the past 6 months prior to admission? : No Is patient at risk for suicide?: No Suicidal Plan?: No Has patient had any suicidal plan within the past 6 months prior to admission? : No Access to Means: No What has been your use of drugs/alcohol within the last 12 months?: Pt would not respond Previous Attempts/Gestures: No How  many times?:  (None reported) Other Self Harm Risks: None reported Triggers for Past Attempts: Unknown Intentional Self Injurious Behavior: None Family Suicide History: Unknown Recent stressful life event(s): Other (Comment) (Unknown) Persecutory voices/beliefs?: No Depression: No Substance abuse history and/or treatment for substance abuse?: No (Unable to assess ) Suicide prevention information given to non-admitted patients: Not applicable  Risk to Others within the past 6 months Homicidal Ideation: No Does patient have any lifetime risk of violence toward others beyond the six months prior to admission? : Unknown Thoughts of Harm to Others: No Current Homicidal Intent: No Current Homicidal Plan: No Access to Homicidal Means: No Identified Victim: None reported History of harm to others?: No Assessment of Violence: None Noted Violent Behavior Description: None reported Does patient have access to weapons?: No Criminal Charges Pending?: No Does patient have a court date: No Is patient on probation?: No  Psychosis Hallucinations: Auditory, Visual Delusions: Somatic  Mental Status Report Appearance/Hygiene: Bizarre, Disheveled Eye Contact: Poor Motor Activity: Tremors, Restlessness Speech: Word salad Level of Consciousness: Restless, Quiet/awake Mood: Other (Comment) (Unable to assess) Affect: Preoccupied Anxiety Level: None Thought Processes: Flight of Ideas Judgement: Impaired Orientation: Not oriented Obsessive Compulsive Thoughts/Behaviors: Unable to Assess  Cognitive Functioning Concentration: Poor Memory: Unable to Assess IQ: Average Insight: Poor Impulse Control: Unable to Assess Appetite: Poor Weight Loss: 0 Weight Gain: 0 Sleep: Unable to Assess Total Hours of Sleep: 0 Vegetative Symptoms: None  ADLScreening [facility] Assessment Services) Patient's cognitive ability adequate to safely complete daily activities?: No Patient able to express need for  assistance with ADLs?: No Independently performs ADLs?: Yes (appropriate for developmental age)  Prior Inpatient Therapy Prior Inpatient Therapy: No  Prior Outpatient Therapy Prior Outpatient Therapy: No Does patient have an ACCT team?: Unknown Does patient have Intensive In-House Services?  : Unknown Does patient have [facility] services? : Unknown Does patient have P4CC services?: Unknown  ADL Screening (condition at time of admission) Patient's cognitive ability adequate to safely complete daily activities?: No Patient able to express need for assistance with ADLs?: No Independently performs ADLs?: Yes (appropriate for developmental age)       Abuse/Neglect Assessment (Assessment to be complete while patient is alone) Physical Abuse:  (Unable to Assess) Verbal Abuse:  (Unable to Assess) Sexual Abuse:  (Unable to Assess) Exploitation of patient/patient's resources:  (Unable to Assess) Self-Neglect:  (Unable to Assess) Possible abuse reported to::  (Unable to Assess) Values / Beliefs Cultural Requests During Hospitalization: None Spiritual Requests During Hospitalization: None Consults Spiritual Care Consult Needed: No Social Work Consult Needed: No Merchant navy officerAdvance Directives (For Healthcare) Does patient have an advance directive?: No Would patient like information on creating an advanced directive?: No - patient declined information    Additional Information 1:1 In Past 12 Months?: No CIRT Risk: No Elopement Risk: No Does patient have medical clearance?: Yes     Disposition:  Disposition Initial Assessment Completed for this Encounter: Yes Disposition of Patient: Referred to Patient referred to: Other (Comment) (Psych MD Consult)  On Site Evaluation by:   Reviewed with Physician:    Ramon DredgeHannah S Amit Leece 12/19/2014 9:23 AM

## 2014-12-19 NOTE — Consult Note (Signed)
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 Marijuana abuse [F12.10] 12/19/2014    Total Time spent with patient: 45 minutes  Subjective:   Manuel Richards is a 24 y.o. male patient admitted with patient himself is really unable to give any meaningful information at this time.Marland Kitchen  HPI:  Information from the patient to a limited degree, from the chart including the old chart and from speaking with his mother who has observed the current clinical situation. Patient has had behavioral health evaluations in years past and those were reviewed. Mother tells me that patient has been in his current clinical condition for about 3 days. Prior to that reportedly he was in a normal state of health and mental health. Reportedly he just stopped speaking and started acting bizarrely about 3 days ago. Mother and girlfriend both report this. Girlfriend who lives with him claims that he has not been abusing drugs as far she knows. There is no reported information so far about any kind of trauma or illness. The patient himself really doesn't give any reliable information. He seemed to indicate that he was not using drugs but I couldn't really be sure. Reportedly he has told staff here that he is having hallucinations and hearing voices. He sort of vaguely knotted about it when I ask him but couldn't give me any more data.  Social history: Patient lives with his girlfriend. Apparently he does have a job and usually works regularly. His mother is his closest  biological relatives as far as we can tell and also has seen him in the last couple days and been in to visit him here.  Medical history: Patient has no known medical problems no history known of high blood pressure diabetes seizures.  Substance abuse history: About 2 years ago he was evaluated for "detox from everything". The mother and girlfriend report that they don't think that he's been abusing any drugs recently. His drug screen is positive for opiates and marijuana. Patient isn't able to give any kind of information. He does indicate that he has not been drinking.  Current medications: None  Past Psychiatric History: No known past psychiatric admissions or psychiatric hospitalization. He had been treated for ADHD in the past but that doesn't appear to be an active treatment problem. He was evaluated in the past by counselors for what he called detox from everything. According to the family he has not to their knowledge been using substances recently. Mother reports that she does not know of him ever having an episode similar to what he is having now.  Risk to Self: Suicidal Ideation: No Suicidal Intent: No Is patient at risk for suicide?: No Suicidal Plan?: No Access to Means: No What has been your use of drugs/alcohol within the last 12 months?: Pt would not respond How many times?:  (None reported) Other Self Harm Risks: None reported Triggers for Past Attempts: Unknown Intentional Self Injurious Behavior: None Risk to Others: Homicidal Ideation: No Thoughts of Harm to Others: No Current Homicidal Intent: No Current Homicidal Plan: No Access to Homicidal Means: No  Identified Victim: None reported History of harm to others?: No Assessment of Violence: None Noted Violent Behavior Description: None reported Does patient have access to weapons?: No Criminal Charges Pending?: No Does patient have a court date: No Prior Inpatient Therapy: Prior Inpatient Therapy: No Prior  Outpatient Therapy: Prior Outpatient Therapy: No Does patient have an ACCT team?: Unknown Does patient have Intensive In-House Services?  : Unknown Does patient have [facility] services? : Unknown Does patient have P4CC services?: Unknown  Past Medical History:  Past Medical History  Diagnosis Date  . Anxiety   . ADHD (attention deficit hyperactivity disorder)    History reviewed. No pertinent past surgical history. Family History: No family history on file. Family Psychiatric  History: Mother tells me that there is a family history of bipolar disorder but denies any other family history including substance abuse Social History:  History  Alcohol Use  . Yes     History  Drug Use  . Yes  . Special: Cocaine, Marijuana    Social History   Social History  . Marital Status: Single    Spouse Name: N/A  . Number of Children: N/A  . Years of Education: N/A   Social History Main Topics  . Smoking status: Current Every Day Smoker  . Smokeless tobacco: None  . Alcohol Use: Yes  . Drug Use: Yes    Special: Cocaine, Marijuana  . Sexual Activity: Not Asked   Other Topics Concern  . None   Social History Narrative   Additional Social History:    Pain Medications: Unable to Assess Prescriptions: Unable to Assess Over the Counter: Unable to Assess Longest period of sobriety (when/how long): Unable to Assess                     Allergies:   Allergies  Allergen Reactions  . Penicillins Other (See Comments)    Reaction:  Unknown; childhood reaction     Labs:  Results for orders placed or performed during the hospital encounter of 12/18/14 (from the past 48 hour(s))  Comprehensive metabolic panel     Status: Abnormal   Collection Time: 12/18/14  5:41 PM  Result Value Ref Range   Sodium 137 135 - 145 mmol/L   Potassium 3.4 (L) 3.5 - 5.1 mmol/L   Chloride 102 101 - 111 mmol/L   CO2 24 22 - 32 mmol/L   Glucose, Bld 147 (H) 65 - 99 mg/dL   BUN 21 (H) 6 - 20 mg/dL    Creatinine, Ser 1.00 0.61 - 1.24 mg/dL   Calcium 9.7 8.9 - 10.3 mg/dL   Total Protein 8.4 (H) 6.5 - 8.1 g/dL   Albumin 5.3 (H) 3.5 - 5.0 g/dL   AST 22 15 - 41 U/L   ALT 17 17 - 63 U/L   Alkaline Phosphatase 71 38 - 126 U/L   Total Bilirubin 1.0 0.3 - 1.2 mg/dL   GFR calc non Af Amer >60 >60 mL/min   GFR calc Af Amer >60 >60 mL/min    Comment: (NOTE) The eGFR has been calculated using the CKD EPI equation. This calculation has not been validated in all clinical situations. eGFR's persistently <60 mL/min signify possible Chronic Kidney Disease.    Anion gap 11 5 - 15  CBC     Status: Abnormal   Collection Time: 12/18/14  5:41 PM  Result Value Ref Range   WBC 13.4 (H) 3.8 - 10.6 K/uL   RBC 5.25 4.40 - 5.90 MIL/uL  Hemoglobin 16.3 13.0 - 18.0 g/dL   HCT 48.1 40.0 - 52.0 %   MCV 91.5 80.0 - 100.0 fL   MCH 31.1 26.0 - 34.0 pg   MCHC 34.0 32.0 - 36.0 g/dL   RDW 12.6 11.5 - 14.5 %   Platelets 382 150 - 440 K/uL  Ethanol     Status: None   Collection Time: 12/18/14  5:41 PM  Result Value Ref Range   Alcohol, Ethyl (B) <5 <5 mg/dL    Comment:        LOWEST DETECTABLE LIMIT FOR SERUM ALCOHOL IS 5 mg/dL FOR MEDICAL PURPOSES ONLY   Acetaminophen level     Status: Abnormal   Collection Time: 12/18/14  5:41 PM  Result Value Ref Range   Acetaminophen (Tylenol), Serum <10 (L) 10 - 30 ug/mL    Comment:        THERAPEUTIC CONCENTRATIONS VARY SIGNIFICANTLY. A RANGE OF 10-30 ug/mL MAY BE AN EFFECTIVE CONCENTRATION FOR MANY PATIENTS. HOWEVER, SOME ARE BEST TREATED AT CONCENTRATIONS OUTSIDE THIS RANGE. ACETAMINOPHEN CONCENTRATIONS >150 ug/mL AT 4 HOURS AFTER INGESTION AND >50 ug/mL AT 12 HOURS AFTER INGESTION ARE OFTEN ASSOCIATED WITH TOXIC REACTIONS.   Salicylate level     Status: None   Collection Time: 12/18/14  5:41 PM  Result Value Ref Range   Salicylate Lvl <4.6 2.8 - 30.0 mg/dL  Urinalysis complete, with microscopic     Status: Abnormal   Collection Time: 12/18/14  7:33  PM  Result Value Ref Range   Color, Urine YELLOW (A) YELLOW   APPearance CLOUDY (A) CLEAR   Glucose, UA NEGATIVE NEGATIVE mg/dL   Bilirubin Urine NEGATIVE NEGATIVE   Ketones, ur 1+ (A) NEGATIVE mg/dL   Specific Gravity, Urine 1.030 1.005 - 1.030   Hgb urine dipstick 1+ (A) NEGATIVE   pH 6.0 5.0 - 8.0   Protein, ur 100 (A) NEGATIVE mg/dL   Nitrite NEGATIVE NEGATIVE   Leukocytes, UA NEGATIVE NEGATIVE   RBC / HPF 6-30 0 - 5 RBC/hpf   WBC, UA 6-30 0 - 5 WBC/hpf   Bacteria, UA NONE SEEN NONE SEEN   Squamous Epithelial / LPF 0-5 (A) NONE SEEN   Mucous PRESENT   Urine Drug Screen, Qualitative ([facility] only)     Status: Abnormal   Collection Time: 12/18/14  7:33 PM  Result Value Ref Range   Tricyclic, Ur Screen NONE DETECTED NONE DETECTED   Amphetamines, Ur Screen NONE DETECTED NONE DETECTED   MDMA (Ecstasy)Ur Screen NONE DETECTED NONE DETECTED   Cocaine Metabolite,Ur [place] NONE DETECTED NONE DETECTED   Opiate, Ur Screen POSITIVE (A) NONE DETECTED   Phencyclidine (PCP) Ur S NONE DETECTED NONE DETECTED   Cannabinoid 50 Ng, Ur [place] POSITIVE (A) NONE DETECTED   Barbiturates, Ur Screen NONE DETECTED NONE DETECTED   Benzodiazepine, Ur Scrn NONE DETECTED NONE DETECTED   Methadone Scn, Ur NONE DETECTED NONE DETECTED    Comment: (NOTE) 803  Tricyclics, urine               Cutoff 1000 ng/mL 200  Amphetamines, urine             Cutoff 1000 ng/mL 300  MDMA (Ecstasy), urine           Cutoff 500 ng/mL 400  Cocaine Metabolite, urine       Cutoff 300 ng/mL 500  Opiate, urine                   Cutoff 300 ng/mL 600  Phencyclidine (PCP), urine      Cutoff 25 ng/mL 700  Cannabinoid, urine              Cutoff 50 ng/mL 800  Barbiturates, urine             Cutoff 200 ng/mL 900  Benzodiazepine, urine           Cutoff 200 ng/mL 1000 Methadone, urine                Cutoff 300 ng/mL 1100 1200 The urine drug screen provides only a preliminary, unconfirmed 1300 analytical test result and should not be used for  non-medical 1400 purposes. Clinical consideration and professional judgment should 1500 be applied to any positive drug screen result due to possible 1600 interfering substances. A more specific alternate chemical method 1700 must be used in order to obtain a confirmed analytical result.  1800 Gas chromato graphy / mass spectrometry (GC/MS) is the preferred 1900 confirmatory method.     Current Facility-Administered Medications  Medication Dose Route Frequency Provider Last Rate Last Dose  . haloperidol (HALDOL) tablet 1 mg  1 mg Oral Q6H John T Clapacs, MD      . LORazepam (ATIVAN) tablet 1 mg  1 mg Oral Q6H Gonzella Lex, MD       No current outpatient prescriptions on file.    Musculoskeletal: Strength & Muscle Tone: within normal limits Gait & Station: shuffle Patient leans: N/A  Psychiatric Specialty Exam: Review of Systems  Unable to perform ROS   Blood pressure 125/89, pulse 108, temperature 98.7 F (37.1 C), temperature source Oral, resp. rate 18, height '5\' 8"'  (1.727 m), weight 54.432 kg (120 lb), SpO2 98 %.Body mass index is 18.25 kg/(m^2).  General Appearance: Disheveled  Eye Contact::  Minimal  Speech:  Garbled and Slow  Volume:  Decreased  Mood:  Unable to state  Affect:  Flat  Thought Process:  Very disorganized and almost absent during my admission.  Orientation:  Negative  Thought Content:  Hallucinations: Auditory  Suicidal Thoughts:  No  Homicidal Thoughts:  For both suicidal and homicidal ideation the patient seemed to shake his head to indicate no but again I don't know that this is really reliable. We don't have any evidence of any suicidal or homicidal behavior  Memory:  Negative Could not do any testing  Judgement:  Negative  Insight:  Negative  Psychomotor Activity:  Restlessness  Concentration:  Poor  Recall:  Poor  Fund of Knowledge:Poor  Language: Poor  Akathisia:  It's possible. He is a little bit restless and he did get some Haldol  earlier but I don't think we can clearly say this is akathisia  Handed:  Right  AIMS (if indicated):     Assets:  Physical Health Social Support  ADL's:  Intact  Cognition: Impaired,  Mild  Sleep:      Treatment Plan Summary: Daily contact with patient to assess and evaluate symptoms and progress in treatment, Medication management and Plan 24 year old man presents with what appears to be a slightly agitated catatonia. He is not communicative and appears to be very blank in his thought and behavior. A lot of his movements show repeated mannerisms. Apparently this is been going on only for a few days and there is no reported prior history of mental illness except for substance abuse. Drug screen positive for marijuana and opiates. Patient not able to give any more history. 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 Could be substance abuse could be early schizophrenia could be mood disorder. No evidence of medical problem. He will be admitted to the psychiatry ward downstairs. 15 minute checks in place. Check labs including hemoglobin A1c lipid panel and TSH. I am starting him on standing modest doses of Haldol and Ativan for his current condition. Team downstairs can work on further treatment. I spoke with his mother on the phone and advised her that he was likely to be admitted to the hospital.  Disposition: Recommend psychiatric Inpatient admission when medically cleared.  John Clapacs 12/19/2014 2:53 PM

## 2014-12-19 NOTE — Plan of Care (Signed)
Problem: Ineffective individual coping Goal: STG: Patient will remain free from self harm Outcome: Not Progressing Medications administered as ordered by the physician, x1 IM 50 mg Benadryl given for EPS, patient responded well to medication, no PRN given, unable to understand education provided regarding medications Therapeutic Effects, SEs and Adverse effects as discussed, questions encouraged, responses nonsensical and irrelevant due to Psychosis; 1:1 observations maintained for safety; clinical and moral support provided, patient encouraged to continue to express feelings and demonstrate safe care. Patient remain free from harm, will continue to monitor.

## 2014-12-19 NOTE — ED Notes (Signed)
Pt had to be redirected and oriented to his room on multiple ocasions.

## 2014-12-19 NOTE — BH Specialist Note (Signed)
TTS unable to complete assessment. Patient is agitated and responding to internal stimuli.  He is very confused and is having a hard time acclimating himself to his surroundings.

## 2014-12-19 NOTE — Progress Notes (Signed)
Intrusive, difficult to redirect confused, Responding to I/S, Vivid Auditory Hallucinating, Wandering to other patient's room, risk of harm from others imminent. Medication given now per nurses' clinical judgement. Patient has not had any medication except for imodium. Room closer to the nurses' station, will monitor closely to prevent this patient from Harm/Injury. VSS, medicated, will monitor responsiveness to medication.

## 2014-12-19 NOTE — [facility] Counselor (Signed)
Writer spoke to Bank of New York CompanyPt's nurse, Aundra MilletMegan, RN about assessment. Pt is not in a place to talk at this moment. TTS will attempt to assess later on today.  Anne ShutterHannah Alyn Riedinger, LPCA Therapeutic Triage Specialist

## 2014-12-19 NOTE — ED Notes (Signed)
Despite being heavily medicated, Pt still up with visual and auditory hallucinations. Pt has intervals of 3-15 min of sleep then wakes up.

## 2014-12-19 NOTE — Tx Team (Signed)
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  The patient and family have been given the opportunity to ask questions and make suggestions.  Crissie ReeseHeather L Cornell Bourbon 12/19/2014, 6:01 PM

## 2014-12-19 NOTE — ED Notes (Signed)
Pt continues to lay in bed with active A/V hallucinations at this time. Respirations are noted to be even and unlabored at this time. Pt is calm with staff. Will continue with 15 min safety checks.

## 2014-12-19 NOTE — ED Notes (Signed)
Pt presents alert and oriented at this time. Pt able to answer questions correctly and appropriately with staff. Pt states the month and the year and that he is at a hospital. Pt able to tell staff that he is no longer having visual hallucinations but states that he is having auditory hallucinations with upwards of 15 voices in his head that are "just talking" but are not commanding him to do anything.

## 2014-12-19 NOTE — ED Notes (Signed)
NAD noted at this time. Pt sitting up on side of bed talking to the wall. Respirations even and unlabored at this time. Will continue with 15 min safety rounds.

## 2014-12-19 NOTE — ED Notes (Signed)
Pt resting in bed at this time. Pt continues to have A/V hallucinations at this time. Pt calm and cooperative with staff. Will continue 15 min safety rounds.

## 2014-12-19 NOTE — ED Notes (Signed)

## 2014-12-19 NOTE — Progress Notes (Signed)
Pulling down window blinds in his room and attempting to climb the window, Clinical research associatewriter with this patient now to prevent injury!

## 2014-12-20 DIAGNOSIS — F202 Catatonic schizophrenia: Secondary | ICD-10-CM

## 2014-12-20 LAB — HEMOGLOBIN A1C: Hgb A1c MFr Bld: 5.6 % (ref 4.0–6.0)

## 2014-12-20 MED ORDER — HALOPERIDOL LACTATE 2 MG/ML PO CONC
1.0000 mg | Freq: Four times a day (QID) | ORAL | Status: DC
  Administered 2014-12-20: 1 mg via ORAL
  Filled 2014-12-20 (×8): qty 0.5

## 2014-12-20 MED ORDER — LORAZEPAM 2 MG/ML IJ SOLN
2.0000 mg | INTRAMUSCULAR | Status: DC | PRN
  Administered 2014-12-20: 2 mg via INTRAMUSCULAR
  Filled 2014-12-20: qty 1

## 2014-12-20 MED ORDER — LORAZEPAM 2 MG/ML IJ SOLN
2.0000 mg | INTRAMUSCULAR | Status: DC | PRN
  Administered 2014-12-20 (×2): 2 mg via INTRAMUSCULAR
  Filled 2014-12-20: qty 1

## 2014-12-20 MED ORDER — LORAZEPAM 2 MG PO TABS: 2.0000 mg | ORAL_TABLET | ORAL | Status: DC | PRN

## 2014-12-20 NOTE — Progress Notes (Signed)
AV/H continues, roaming around freely in room, pretending to be smoking, pretending to be talking on the phone, talking to self, laughs in appropriately, he has not slept.

## 2014-12-20 NOTE — Progress Notes (Signed)
Recreation Therapy Notes  Date: 11.03.16 Time: 3:00 pm Location: Craft Room  Group Topic: Leisure Education  Goal Area(s) Addresses:  Patient will identify things they are grateful for. Patient will identify how being grateful can influence decision making.  Behavioral Response: Did not attend  Intervention: Grateful Wheel  Activity: Patients were given an I am Grateful For worksheet and instructed to think of 2-3 things they were grateful for under each category.  Education: LRT educated patients on why it is important to be grateful.  Education Outcome: Patient did not attend group.   Clinical Observations/Feedback: Patient did not attend group.  Jacquelynn CreeGreene,Maisee Vollman M, LRT/CTRS 12/20/2014 3:52 PM

## 2014-12-20 NOTE — Progress Notes (Signed)
Patient's mother, Manuel Richards: [phone], called; password on the account stated correctly; she asked how Manuel Richards is doing, patient care and current condition with limited information provided based on "a need to know." Discussion focused back on collateral information and aftercare/community re-entry. "I was the one who told his girlfriend that Manuel Richards needs to go to the hospital now, he lives in the [address] with his GF.", Manuel Richards said. Disposition: question asked of her, "Will Manuel Richards be going back to the same environment that exposed him to illicit drugs or will he be going to a more structured environment?" Manuel Richards will work with the SW to determine placement in alternative living arrangements.

## 2014-12-20 NOTE — [facility] Group Notes (Signed)
[facility] Group Notes:  (Nursing/MHT/Case Management/Adjunct)  Date:  12/20/2014  Time:  6:28 AM  Type of Therapy:  Group Therapy  Participation Level:  Did Not Attend   Summary of Progress/Problems:  Manuel Richards 12/20/2014, 6:28 AM

## 2014-12-20 NOTE — Progress Notes (Signed)
Recreation Therapy Notes  At approximately 8:45 am, LRT spoke with patient's nurse regarding patient's assessment. Patient's nurse reported patient was not appropriate for assessment at this time.  Jacquelynn CreeGreene,Angeleah Labrake M, LRT/CTRS 12/20/2014 4:24 PM

## 2014-12-20 NOTE — H&P (Signed)
Psychiatric Admission Assessment Adult  Patient Identification: DAVIS AMBROSINI MRN:  [id number] Date of Evaluation:  12/20/2014 Chief Complaint:  schizophremia form catatonia Principal Diagnosis: Schizophreniform catatonia (HCC) Diagnosis:   Patient Active Problem List   Diagnosis Date Noted  . 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 Opioid use disorder, moderate, dependence (HCC) [F11.20] 12/19/2014  . Cannabis use disorder, moderate, dependence (HCC) [F12.20] 12/19/2014  . Tobacco use disorder [F17.200] 12/19/2014   History of Present Illness:  Identifying data. Mr. Fails is a 24 year old male with history of substance use.  Chief complaint. The patient is unable to state.  History of present illness. Information is obtained exclusively from the chart as the patient is unable to participate in the interview. Reportedly Mr. Milke has no psychiatrist past except for substance use. He was brought to the hospital by his girlfriend for bizarre behavior for the past 2 days. The girlfriend denies any recent substance use by the patient his mother however is convinced that he's been using acid and mushrooms. His urine tox screen on admission is positive for cannabinoids and opiates. The patient was slightly agitated in the emergency room. By the time he was admitted to psychiatry ward he was no longer agitated behavior strangely, hiding behind curtains on the window seal, trying to escape, but mostly confused. He was not able to answer simple questions. She is unable to swallow medication food or water. He is catatonic. He answers no questions, he is restless, he started posturing in the corner of the hallway. He had to be transferred to the "" back hall", he has security guard for safety.   Past psychiatric history. He reportedly was evaluated for substance use once. There were no prior hospitalizations for mental illness or substance use. There were no suicide attempts.   Family  psychiatric history none reported.   Social history. He lives with a girlfriend. His mother is involved. He is employed. The family has never seen him acting like that before.  Associated Signs/Symptoms: Total Time spent with patient: 1 hour  Past Psychiatric History: History of substance abuse. Risk to Self: Is patient at risk for suicide?: No Risk to Others:   Prior Inpatient Therapy:   Prior Outpatient Therapy:    Alcohol Screening: Patient refused Alcohol Screening Tool: Yes 1. How often do you have a drink containing alcohol?: Never (unable to assess ) 9. Have you or someone else been injured as a result of your drinking?: No (unable to assess) 10. Has a relative or friend or a doctor or another health worker been concerned about your drinking or suggested you cut down?: No (unable to assess) Alcohol Use Disorder Identification Test Final Score (AUDIT): 0 Brief Intervention: Patient declined brief intervention Substance Abuse History in the last 12 months:  Yes.   Consequences of Substance Abuse: Negative Previous Psychotropic Medications: No  Psychological Evaluations: No  Past Medical History:  Past Medical History  Diagnosis Date  . Anxiety   . ADHD (attention deficit hyperactivity disorder)    History reviewed. No pertinent past surgical history. Family History: History reviewed. No pertinent family history. Family Psychiatric  History: None reported.  Social History:  History  Alcohol Use  . Yes     History  Drug Use  . Yes  . Special: Cocaine, Marijuana    Social History   Social History  . Marital Status: Single    Spouse Name: N/A  . Number of Children: N/A  . Years of Education: N/A  Social History Main Topics  . Smoking status: Current Every Day Smoker  . Smokeless tobacco: None  . Alcohol Use: Yes  . Drug Use: Yes    Special: Cocaine, Marijuana  . Sexual Activity: Not Asked   Other Topics Concern  . None   Social History Narrative    Additional Social History:                         Allergies:   Allergies  Allergen Reactions  . Penicillins Other (See Comments)    Reaction:  Unknown; childhood reaction    Lab Results:  Results for orders placed or performed during the hospital encounter of 12/19/14 (from the past 48 hour(s))  Lipid panel, fasting     Status: Abnormal   Collection Time: 12/19/14  6:50 PM  Result Value Ref Range   Cholesterol 182 0 - 200 mg/dL   Triglycerides 62 <161 mg/dL   HDL 41 >09 mg/dL   Total CHOL/HDL Ratio 4.4 RATIO   VLDL 12 0 - 40 mg/dL   LDL Cholesterol 604 (H) 0 - 99 mg/dL    Comment:        Total Cholesterol/HDL:CHD Risk Coronary Heart Disease Risk Table                     Men   Women  1/2 Average Risk   3.4   3.3  Average Risk       5.0   4.4  2 X Average Risk   9.6   7.1  3 X Average Risk  23.4   11.0        Use the calculated Patient Ratio above and the CHD Risk Table to determine the patient's CHD Risk.        ATP III CLASSIFICATION (LDL):  <100     mg/dL   Optimal  540-981  mg/dL   Near or Above                    Optimal  130-159  mg/dL   Borderline  191-478  mg/dL   High  >295     mg/dL   Very High   TSH     Status: None   Collection Time: 12/19/14  6:50 PM  Result Value Ref Range   TSH 0.922 0.350 - 4.500 uIU/mL    Metabolic Disorder Labs:  No results found for: HGBA1C, MPG No results found for: PROLACTIN Lab Results  Component Value Date   CHOL 182 12/19/2014   TRIG 62 12/19/2014   HDL 41 12/19/2014   CHOLHDL 4.4 12/19/2014   VLDL 12 12/19/2014   LDLCALC 129* 12/19/2014    Current Medications: Current Facility-Administered Medications  Medication Dose Route Frequency Provider Last Rate Last Dose  . acetaminophen (TYLENOL) tablet 650 mg  650 mg Oral Q6H PRN Audery Amel, MD      . alum & mag hydroxide-simeth (MAALOX/MYLANTA) 200-200-20 MG/5ML suspension 30 mL  30 mL Oral Q4H PRN Audery Amel, MD      . diphenhydrAMINE  (BENADRYL) injection 50 mg  50 mg Intramuscular Q2H PRN Audery Amel, MD   50 mg at 12/20/14 1041  . haloperidol (HALDOL) tablet 1 mg  1 mg Oral Q6H Audery Amel, MD   1 mg at 12/20/14 0429  . loperamide (IMODIUM) capsule 2 mg  2 mg Oral PRN Audery Amel, MD      . LORazepam (  ATIVAN) injection 2 mg  2 mg Intramuscular Q4H PRN Shari ProwsJolanta B Rayvon Dakin, MD   2 mg at 12/20/14 1041  . LORazepam (ATIVAN) tablet 1 mg  1 mg Oral Q6H Audery AmelJohn T Clapacs, MD   1 mg at 12/20/14 1008  . magnesium hydroxide (MILK OF MAGNESIA) suspension 30 mL  30 mL Oral Daily PRN Audery AmelJohn T Clapacs, MD       PTA Medications: No prescriptions prior to admission    Musculoskeletal: Strength & Muscle Tone: spastic Gait & Station: unsteady Patient leans: N/A  Psychiatric Specialty Exam: Physical Exam  Nursing note and vitals reviewed.   Review of Systems  Unable to perform ROS: acuity of condition    Blood pressure 117/80, pulse 123, temperature 98.8 F (37.1 C), temperature source Oral, resp. rate 20, height 5\' 8"  (1.727 m), weight 46.267 kg (102 lb), SpO2 98 %.Body mass index is 15.51 kg/(m^2).  See SRA.                                                  Sleep:        Treatment Plan Summary: Daily contact with patient to assess and evaluate symptoms and progress in treatment and Medication management   [name] is a 24 year old male with no past psychiatric history except for substance use was admitted for bizarre, psychotic, catatonic behavior most likely in the context of substance use. He was positive for cannabis and opiates on admission but his mother believes that he was using acid and mushrooms.  1. Catatonia. We'll continue injections of Ativan every 4 hours. The patient is unable to swallow and has to be given medications by injection.  2. Poor oral oral intake the patient is unable to swallow medicines food or water. He may need to be transferred to medical floor.  3. Substance  use. He's positive for cannabinoids and opiates. There are no symptoms of opioid withdrawal so far. We'll continue to monitor.  4. Smoking. Will continue nicotine patch.   5. Disposition. He will likely return to home with his girlfriend. He will follow up with [facility].   Observation Level/Precautions:  Continuous Observation Fall Seizure  Laboratory:  CBC Chemistry Profile HCG UDS  Psychotherapy:    Medications:    Consultations:    Discharge Concerns:    Estimated LOS:  Other:     I certify that inpatient services furnished can reasonably be expected to improve the patient's condition.   Kendrea Cerritos 11/3/201610:46 AM

## 2014-12-20 NOTE — Tx Team (Signed)
Interdisciplinary Treatment Plan Update (Adult)  Date:  12/20/2014 Time Reviewed:  3:42 PM  Progress in Treatment: Attending groups: No. Participating in groups:  No. Taking medication as prescribed:  Yes. Tolerating medication:  Yes. Family/Significant othe contact made:  No, will contact:  Mother  Patient understands diagnosis:  No. and As evidenced by:  Limited insight  Discussing patient identified problems/goals with staff:  Yes. Medical problems stabilized or resolved:  Yes. Denies suicidal/homicidal ideation: Yes. Issues/concerns per patient self-inventory:  Yes. Other:  New problem(s) identified: No, Describe:  NA  Discharge Plan or Barriers: Pt plans to return home and follow up with outpatient.    Reason for Continuation of Hospitalization: Delusions  Hallucinations Medication stabilization Withdrawal symptoms  Comments:[name] has no psychiatrist past except for substance use. He was brought to the hospital by his girlfriend for bizarre behavior for the past 2 days. The girlfriend denies any recent substance use by the patient his mother however is convinced that he's been using acid and mushrooms. His urine tox screen on admission is positive for cannabinoids and opiates. The patient was slightly agitated in the emergency room. By the time he was admitted to psychiatry ward he was no longer agitated behavior strangely, hiding behind curtains on the window seal, trying to escape, but mostly confused. He was not able to answer simple questions. She is unable to swallow medication food or water. He is catatonic. He answers no questions, he is restless, he started posturing in the corner of the hallway. He had to be transferred to the "" back hall", he has security guard for safety.    Estimated length of stay: 7 days   New goal(s):  Review of initial/current patient goals per problem list:   1.  Goal(s): Patient will participate in aftercare plan * Met:  * Target  date: at discharge * As evidenced by: Patient will participate within aftercare plan AEB aftercare provider and housing plan at discharge being identified.   2.  Goal(s): Patient will demonstrate decreased signs of withdrawal due to substance abuse * Met:  * Target date: at discharge * As evidenced by: Patient will produce a CIWA/COWS score of 0, have stable vitals signs, and no symptoms of withdrawal.  3.  Goal (s): Patient will demonstrate decreased symptoms of psychosis. * Met: No  *  Target date: at discharge * As evidenced by: Patient will not endorse signs of psychosis or be deemed stable for discharge by MD.   Attendees: Patient:  Manuel Richards 11/3/20163:42 PM  Family:   11/3/20163:42 PM  Physician:   Dr. Bary Leriche  11/3/20163:42 PM  Nursing:   Meredith Mody, RN  11/3/20163:42 PM  Case Manager:   11/3/20163:42 PM  Counselor:   11/3/20163:42 PM  Other:  Wray Kearns, Lyons Switch  11/3/20163:42 PM  Other:  Everitt Amber, [place]  11/3/20163:42 PM  Other:   11/3/20163:42 PM  Other:  11/3/20163:42 PM  Other:  11/3/20163:42 PM  Other:  11/3/20163:42 PM  Other:  11/3/20163:42 PM  Other:  11/3/20163:42 PM  Other:  11/3/20163:42 PM  Other:   11/3/20163:42 PM   Scribe for Treatment Team:   Wray Kearns, MSW, LCSWA  12/20/2014, 3:42 PM

## 2014-12-20 NOTE — Progress Notes (Signed)
Patient is not responding to Haldol & Ativan (???? A Rebounding Effects), he is more restless, wandering about, AV/H more intense

## 2014-12-20 NOTE — Progress Notes (Signed)
Ranting, talkative, hyper verbal, thoughts are disconnected, disorganized and nonsensical, spitting, allowed to roam free in his room while he is closely monitored, redirected from walking on his bed, he talked back to his reflection from the window actively, re-arranged empty garbage containers, these behaviors are not consider to be unsafe, he is not processing information correctly at this time. Will continue to monitor for safety.

## 2014-12-20 NOTE — [facility] Suicide Risk Assessment (Signed)
[facility]   Nursing information obtained from:    Demographic factors:    Current Mental Status:    Loss Factors:    Historical Factors:    Risk Reduction Factors:    Total Time spent with patient: 1 hour Principal Problem: Schizophreniform catatonia (HCC) Diagnosis:   Patient Active Problem List   Diagnosis Date Noted  . 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 Opioid use disorder, moderate, dependence (HCC) [F11.20] 12/19/2014  . Cannabis use disorder, moderate, dependence (HCC) [F12.20] 12/19/2014  . Tobacco use disorder [F17.200] 12/19/2014     Continued Clinical Symptoms:  Alcohol Use Disorder Identification Test Final Score (AUDIT): 0 The "Alcohol Use Disorders Identification Test", Guidelines for Use in Primary Care, Second Edition.  World Science writerHealth Organization [facility](WHO). Score between 0-7:  no or low risk or alcohol related problems. Score between 8-15:  moderate risk of alcohol related problems. Score between 16-19:  high risk of alcohol related problems. Score 20 or above:  warrants further diagnostic evaluation for alcohol dependence and treatment.   CLINICAL FACTORS:   Alcohol/Substance Abuse/Dependencies Currently Psychotic   Musculoskeletal: Strength & Muscle Tone: spastic Gait & Station: unsteady Patient leans: N/A  Psychiatric Specialty Exam: Physical Exam  Nursing note and vitals reviewed. Constitutional: He appears well-developed and well-nourished.  HENT:  Head: Normocephalic and atraumatic.  Eyes: Conjunctivae and EOM are normal. Pupils are equal, round, and reactive to light.  Neck: Normal range of motion. Neck supple.  Cardiovascular: Normal rate, regular rhythm and normal heart sounds.   Respiratory: Effort normal and breath sounds normal.  GI: Soft. Bowel sounds are normal.  Musculoskeletal: Normal range of motion.  Neurological: He is alert.  Skin: Skin is warm and dry.    Review of Systems  Unable to  perform ROS: acuity of condition    Blood pressure 117/80, pulse 123, temperature 98.8 F (37.1 C), temperature source Oral, resp. rate 20, height 5\' 8"  (1.727 m), weight 46.267 kg (102 lb), SpO2 98 %.Body mass index is 15.51 kg/(m^2).  General Appearance: Bizarre  Eye Contact::  Absent  Speech:  Blocked  Volume:  Decreased  Mood:  Unable to assess  Affect:  Constricted  Thought Process:  Unable to assess  Orientation:  Other:  Unable to assess  Thought Content:  Delusions, Hallucinations: Auditory Visual and Paranoid Ideation  Suicidal Thoughts:  Unable to assess  Homicidal Thoughts:  Unable to assess  Memory:  Unable to assess  Judgement:  Poor  Insight:  Lacking  Psychomotor Activity:  Catatonia.  Concentration:  Poor  Recall:  Poor  Fund of Knowledge:Poor  Language: Poor  Akathisia:  No  Handed:  Right  AIMS (if indicated):     Assets:  Physical Health  Sleep:     Cognition: Impaired,  Severe  ADL's:  Impaired     COGNITIVE FEATURES THAT CONTRIBUTE TO RISK:  None    SUICIDE RISK:   Moderate:  Frequent suicidal ideation with limited intensity, and duration, some specificity in terms of plans, no associated intent, good self-control, limited dysphoria/symptomatology, some risk factors present, and identifiable protective factors, including available and accessible social support.  PLAN OF CARE: Hospital admission, medication management, substance abuse counseling, discharge planning.   Medical Decision Making:  New problem, with additional work up planned, Review of Psycho-Social Stressors (1), Review or order clinical lab tests (1), Review of Medication Regimen & Side Effects (2) and Review of New Medication or Change in Dosage (2)   Mr.  Richards is a 24 year old male with no past psychiatric history except for substance use was admitted for bizarre, psychotic, catatonic behavior most likely in the context of substance use. He was positive for cannabis and opiates on  admission but his mother believes that he was using acid and mushrooms.  1. Catatonia. We'll continue injections of Ativan every 4 hours. The patient is unable to swallow and has to be given medications by injection.  2. Poor oral oral intake the patient is unable to swallow medicines food or water. He may need to be transferred to medical floor.  3. Substance use. He's positive for cannabinoids and opiates. There are no symptoms of opioid withdrawal so far. We'll continue to monitor.  4. Smoking. Will continue nicotine patch.   5. Disposition. He will likely return to home with his girlfriend. He will follow up with [facility].  I certify that inpatient services furnished can reasonably be expected to improve the patient's condition.   Wilma Michaelson 12/20/2014, 10:37 AM

## 2014-12-20 NOTE — [facility] Group Notes (Signed)
[facility] Group Notes:  (Nursing/MHT/Case Management/Adjunct)  Date:  12/20/2014  Time:  2:23 PM  Type of Therapy:  Group Therapy  Participation Level:  Did Not Attend  Manuel Richards Manuel Richards 12/20/2014, 2:23 PM

## 2014-12-20 NOTE — Progress Notes (Signed)
D: Patient remains in scrubs , normal gait  Non participatory with unit programing . Unaware of surrounding . Auditory and visual hallucinations . Patient observed talking on the phone  And smoking pot frequently  As though those's items were present mocking the actions . Patient served finger foods but did not eat. Constantly pacing floor  A: Encourage patient to come to staff for any concerns or issues needing to be addressed . Instructions given on medication , unable to verbalize understanding. Writer addressing issues.  Patient remains to have 1:1 present. R: Voice no other concerns , staff continue tor monitor .

## 2014-12-21 MED ORDER — LORAZEPAM 2 MG/ML IJ SOLN: 1.0000 mg | INTRAMUSCULAR | Status: DC | PRN

## 2014-12-21 MED ORDER — RISPERIDONE 0.5 MG PO TBDP: 0.5000 mg | ORAL_TABLET | Freq: Two times a day (BID) | ORAL | Status: DC

## 2014-12-21 MED ORDER — LORAZEPAM 1 MG PO TABS
1.0000 mg | ORAL_TABLET | Freq: Four times a day (QID) | ORAL | Status: DC | PRN
  Administered 2014-12-21 (×2): 1 mg via ORAL
  Filled 2014-12-21 (×2): qty 1

## 2014-12-21 MED ORDER — TRAZODONE HCL 100 MG PO TABS
100.0000 mg | ORAL_TABLET | Freq: Every day | ORAL | Status: DC
  Administered 2014-12-21 – 2014-12-23 (×3): 100 mg via ORAL
  Filled 2014-12-21 (×3): qty 1

## 2014-12-21 MED ORDER — RISPERIDONE 0.5 MG PO TBDP
0.5000 mg | ORAL_TABLET | Freq: Two times a day (BID) | ORAL | Status: DC | PRN
  Administered 2014-12-23: 0.5 mg via ORAL
  Filled 2014-12-21: qty 1

## 2014-12-21 MED ORDER — LEVOFLOXACIN 500 MG PO TABS
500.0000 mg | ORAL_TABLET | Freq: Every day | ORAL | Status: DC
  Administered 2014-12-21 – 2014-12-24 (×4): 500 mg via ORAL
  Filled 2014-12-21 (×4): qty 1

## 2014-12-21 NOTE — [facility] Group Notes (Signed)
[facility] Group Notes:  (Nursing/MHT/Case Management/Adjunct)  Date:  12/21/2014  Time:  12:41 AM  Type of Therapy:  Psychoeducational Skills  Participation Level:  Did Not Attend  Summary of Progress/Problems:  [name] 12/21/2014, 12:41 AM

## 2014-12-21 NOTE — [facility] Group Notes (Signed)
[facility] Group Notes:  (Nursing/MHT/Case Management/Adjunct)  Date:  12/21/2014  Time:  11:56 AM  Type of Therapy:  Group Therapy  Participation Level:  Did Not Attend  Sherry Rogus De'Chelle Darryl Willner 12/21/2014, 11:56 AM

## 2014-12-21 NOTE — Plan of Care (Signed)
Problem: Ineffective individual coping Goal: LTG: Patient will report a decrease in negative feelings Outcome: Progressing Patient notes understanding of being in a "program". States he feels good but relates this to his belief that he is actively smoking marijuana.  Goal: STG: Pt will be able to identify effective and ineffective STG: Pt will be able to identify effective and ineffective coping patterns  Outcome: Not Progressing Patient continues to be disoriented. Acting out the process of smoking.  Goal: STG: Patient will remain free from self harm Outcome: Progressing No self harm.

## 2014-12-21 NOTE — Progress Notes (Addendum)
[facility] MD Progress Note  12/21/2014 11:25 AM Manuel Richards  MRN:  [id number]  Subjective:  Manuel Richards is a 24 year old male with history of substance use admitted for a psychotic break with catatonia and altered mental status in the context of substance use.  Yesterday the patient was delirious. Sometimes he was not oriented even to person. At other times having comprehensive conversation. His mother believes that he was using acid and mushrooms. He was positive for opiates and cannabis. I spoke with her mother extensively. She would like substance abuse treatment for the patient. The patient had periods of agitation. He has auditory and visual hallucinations peaking stuff from the floor pretending to take pills or snorting cocaine. He has been unkind to our staff. He is a racist.  Today Manuel Richards is asleep. He wakes up briefly and seems better oriented to day. He has been given injections of Ativan for catatonia. He has not been able to swallow medications due to altered mental status.  12/21/2014 12:42 The patient is awake and well. He is fully oriented. He denies any symptoms of depression, anxiety, or psychosis. He does not understand how he ended up in the hospital and does not remember much. He does admit to using cannabis and narcotic painkillers along with other substances. It is not impossible, he says, that he ingested substances to make him psychotic. He ate his lunch with no problems. His fluid intake is adequate now. He is interested in residential substance abuse treatment program I would discontinue labs. We will offer Ativan and Risperdal as needed only for catatonia or psychosis.  Principal Problem: Schizophreniform catatonia (HCC) Diagnosis:   Patient Active Problem List   Diagnosis Date Noted  . 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 Opioid use disorder, moderate, dependence (HCC) [F11.20] 12/19/2014  . Cannabis use disorder, moderate, dependence (HCC) [F12.20]  12/19/2014  . Tobacco use disorder [F17.200] 12/19/2014   Total Time spent with patient: 20 minutes  Past Psychiatric History: Substance use.  Past Medical History:  Past Medical History  Diagnosis Date  . Anxiety   . ADHD (attention deficit hyperactivity disorder)    History reviewed. No pertinent past surgical history. Family History: History reviewed. No pertinent family history. Family Psychiatric  History: None reported. Social History:  History  Alcohol Use  . Yes     History  Drug Use  . Yes  . Special: Cocaine, Marijuana    Social History   Social History  . Marital Status: Single    Spouse Name: N/A  . Number of Children: N/A  . Years of Education: N/A   Social History Main Topics  . Smoking status: Current Every Day Smoker  . Smokeless tobacco: None  . Alcohol Use: Yes  . Drug Use: Yes    Special: Cocaine, Marijuana  . Sexual Activity: Not Asked   Other Topics Concern  . None   Social History Narrative   Additional Social History:                         Sleep: Poor  Appetite:  Poor  Current Medications: Current Facility-Administered Medications  Medication Dose Route Frequency Provider Last Rate Last Dose  . acetaminophen (TYLENOL) tablet 650 mg  650 mg Oral Q6H PRN Audery Amel, MD      . alum & mag hydroxide-simeth (MAALOX/MYLANTA) 200-200-20 MG/5ML suspension 30 mL  30 mL Oral Q4H PRN Audery Amel, MD      .  diphenhydrAMINE (BENADRYL) injection 50 mg  50 mg Intramuscular Q2H PRN Audery Amel, MD   50 mg at 12/20/14 2356  . haloperidol (HALDOL) 2 MG/ML solution 1 mg  1 mg Oral Q6H Kerin Salen, MD   1 mg at 12/20/14 2355  . loperamide (IMODIUM) capsule 2 mg  2 mg Oral PRN Audery Amel, MD      . LORazepam (ATIVAN) tablet 2 mg  2 mg Oral Q4H PRN Kerin Salen, MD       Or  . LORazepam (ATIVAN) injection 2 mg  2 mg Intramuscular Q4H PRN Kerin Salen, MD   2 mg at 12/20/14 2356  . LORazepam (ATIVAN) tablet 1 mg  1  mg Oral Q6H Audery Amel, MD   1 mg at 12/20/14 1008  . magnesium hydroxide (MILK OF MAGNESIA) suspension 30 mL  30 mL Oral Daily PRN Audery Amel, MD        Lab Results:  Results for orders placed or performed during the hospital encounter of 12/19/14 (from the past 48 hour(s))  Hemoglobin A1c     Status: None   Collection Time: 12/19/14  6:50 PM  Result Value Ref Range   Hgb A1c MFr Bld 5.6 4.0 - 6.0 %  Lipid panel, fasting     Status: Abnormal   Collection Time: 12/19/14  6:50 PM  Result Value Ref Range   Cholesterol 182 0 - 200 mg/dL   Triglycerides 62 <161 mg/dL   HDL 41 >09 mg/dL   Total CHOL/HDL Ratio 4.4 RATIO   VLDL 12 0 - 40 mg/dL   LDL Cholesterol 604 (H) 0 - 99 mg/dL    Comment:        Total Cholesterol/HDL:CHD Risk Coronary Heart Disease Risk Table                     Men   Women  1/2 Average Risk   3.4   3.3  Average Risk       5.0   4.4  2 X Average Risk   9.6   7.1  3 X Average Risk  23.4   11.0        Use the calculated Patient Ratio above and the CHD Risk Table to determine the patient's CHD Risk.        ATP III CLASSIFICATION (LDL):  <100     mg/dL   Optimal  540-981  mg/dL   Near or Above                    Optimal  130-159  mg/dL   Borderline  191-478  mg/dL   High  >295     mg/dL   Very High   TSH     Status: None   Collection Time: 12/19/14  6:50 PM  Result Value Ref Range   TSH 0.922 0.350 - 4.500 uIU/mL    Physical Findings: AIMS: Facial and Oral Movements Muscles of Facial Expression: None, normal Lips and Perioral Area: None, normal Jaw: None, normal Tongue: None, normal,Extremity Movements Upper (arms, wrists, hands, fingers): None, normal Lower (legs, knees, ankles, toes): None, normal, Trunk Movements Neck, shoulders, hips: None, normal, Overall Severity Severity of abnormal movements (highest score from questions above): None, normal Incapacitation due to abnormal movements: None, normal Patient's awareness of abnormal  movements (rate only patient's report): No Awareness, Dental Status Current problems with teeth and/or dentures?: No Does patient usually wear dentures?: No  CIWA:  CIWA-Ar Total: 6 COWS:  COWS Total Score: 10  Musculoskeletal: Strength & Muscle Tone: within normal limits Gait & Station: normal Patient leans: N/A  Psychiatric Specialty Exam: Review of Systems  Unable to perform ROS: acuity of condition    Blood pressure 117/80, pulse 123, temperature 98.8 F (37.1 C), temperature source Oral, resp. rate 20, height 5\' 8"  (1.727 m), weight 46.267 kg (102 lb), SpO2 98 %.Body mass index is 15.51 kg/(m^2).  General Appearance: Disheveled  Eye SolicitorContact::  None  Speech:  Slow and Slurred  Volume:  Decreased  Mood:  Angry, Dysphoric and Irritable  Affect:  Inappropriate and Labile  Thought Process:  Loose  Orientation:  Other:  Oriented to person only  Thought Content:  Hallucinations: Auditory Visual  Suicidal Thoughts:  No  Homicidal Thoughts:  No  Memory:  Immediate;   Poor Recent;   Poor Remote;   Poor  Judgement:  Poor  Insight:  Lacking  Psychomotor Activity:  Decreased  Concentration:  Poor  Recall:  Poor  Fund of Knowledge:Poor  Language: Poor  Akathisia:  No  Handed:  Right  AIMS (if indicated):     Assets:  Physical Health  ADL's:  Intact  Cognition: WNL  Sleep:  Number of Hours: 0.75   Treatment Plan Summary: Daily contact with patient to assess and evaluate symptoms and progress in treatment and Medication management   Manuel Richards is a 24 year old male with no past psychiatric history except for substance use was admitted for bizarre, psychotic, catatonic behavior most likely in the context of substance use. He was positive for cannabis and opiates on admission but his mother believes that he was using acid and mushrooms.  1. Catatonia. We'll continue injections of Ativan every 4 hours. The patient is unable to swallow and has to be given medications by  injection. We will try to stop it as soon as the patient improves. Will start low dose Risperdal for psychosis.   2. Poor oral oral intake the patient is unable to swallow medicines food or water. He may need to be transferred to medical floor. We will no longer order labs.   3. Substance use. He's positive for cannabinoids and opiates. There are no symptoms of opioid withdrawal so far. We'll continue to monitor. We will refer him to [facility] rehab program.   4. Smoking. Will continue nicotine patch.   5. UTI. Will get culture and start Levaquin.  6. Insomnia. He slept extremely poorly last night in spite of medications. We will offer trazodone.   7. Disposition. He will likely return to home with his girlfriend. He will follow up with [facility].  Jolanta Pucilowska 12/21/2014, 11:25 AM

## 2014-12-21 NOTE — Plan of Care (Signed)
Problem: Ineffective individual coping Goal: STG: Patient will remain free from self harm Outcome: Progressing Pt is progressing on this goal, has remained free from self harm

## 2014-12-21 NOTE — Progress Notes (Signed)
Recreation Therapy Notes  INPATIENT RECREATION THERAPY ASSESSMENT  Patient Details Name: Shari ProwsJoshua D Grismer MRN: [id number] DOB: 12/25/1990 Today's Date: 12/21/2014  Patient Stressors: Other (Comment) (About to be a father)  Coping Skills:   Isolate, Arguments, Substance Abuse, Avoidance, Exercise, Art/Dance, Sports, Music  Personal Challenges: Anger, Communication, Concentration, Decision-Making, Expressing Yourself, Self-Esteem/Confidence, Social Interaction, Stress Management, Substance Abuse, Trusting Others  Leisure Interests (2+):  Individual - Other (Comment) (Play soccer, skateboard)  Awareness of Community Resources:  No  Community Resources:     Current Use:    If no, Barriers?:    Patient Strengths:  Requested to leave this one blank  Patient Identified Areas of Improvement:  Mental Health, being able to remember  Current Recreation Participation:  Nothing  Patient Goal for Hospitalization:  To figure out what put him in the hospital and ways to revent coming back  [place]idence[place]  County of Residence:  [place]   Current SI (including self-harm):  No  Current HI:  No  Consent to Intern Participation: N/A   Jacquelynn CreeGreene,Zykeriah Mathia M, LRT/CTRS 12/21/2014, 2:12 PM

## 2014-12-21 NOTE — Progress Notes (Signed)
He was sleeping till lunch time.He is oriented & denies suicidal ideation & hallucination.Attended groups,compliant with medications.Still with sitter for safety.

## 2014-12-21 NOTE — Progress Notes (Signed)
Pt is in a pleasant this PM, calm, cooperative and compliant. Deniesi any SI/HI/VAH, visited by his fiancee and visit went well. No concerns voiced, will continue to monitor for safety. Pt still with a sitter for safety.

## 2014-12-21 NOTE — Progress Notes (Signed)
Recreation Therapy Notes  At approximately 4:15 pm, LRT attempted treatment session. Patient sleeping. LRT will attempt again on Monday.  Jacquelynn CreeGreene,Lavana Huckeba M, LRT/CTRS 12/21/2014 4:17 PM

## 2014-12-21 NOTE — Progress Notes (Signed)
Recreation Therapy Notes  Date: 11.04.16 Time: 3:00 pm Location: Craft Room  Group Topic: Coping Skills  Goal Area(s) Addresses:  Patient will participate in coping skill. Patient will verbalize benefit of using art as a coping skill.  Behavioral Response: Did not attend  Intervention: Coloring  Activity: Patients were given coloring sheets and instructed to color and focus on what they were feeling while they were coloring.  Education: LRT educated patients on healthy coping skills.  Education Outcome: Patient did not attend group.  Clinical Observations/Feedback: Patient did not attend group.   Yelina Sarratt M, LRT/CTRS 12/21/2014 3:56 PM 

## 2014-12-21 NOTE — Plan of Care (Signed)
Problem: Alteration in thought process Goal: LTG-Patient behavior demonstrates decreased signs psychosis (Patient behavior demonstrates decreased signs of psychosis to the point the patient is safe to return home and continue treatment in an outpatient setting.)  Outcome: Progressing Denies delusion & AV hallucination.

## 2014-12-22 DIAGNOSIS — F1120 Opioid dependence, uncomplicated: Secondary | ICD-10-CM

## 2014-12-22 MED ORDER — CITALOPRAM HYDROBROMIDE 20 MG PO TABS
20.0000 mg | ORAL_TABLET | Freq: Every day | ORAL | Status: DC
  Administered 2014-12-22 – 2014-12-24 (×3): 20 mg via ORAL
  Filled 2014-12-22 (×3): qty 1

## 2014-12-22 MED ORDER — LORAZEPAM 0.5 MG PO TABS
0.5000 mg | ORAL_TABLET | Freq: Two times a day (BID) | ORAL | Status: AC
  Administered 2014-12-22 – 2014-12-23 (×4): 0.5 mg via ORAL
  Filled 2014-12-22 (×5): qty 1

## 2014-12-22 NOTE — [facility] Group Notes (Signed)
[facility] LCSW Group Therapy  12/22/2014 10:50 AM (late entry for 12/21/2014)   Type of Therapy:  Group Therapy  Participation Level:  Active  Participation Quality:  Attentive  Affect:  Flat  Cognitive:  Disorganized  Insight:  Improving  Engagement in Therapy:  Improving  Modes of Intervention:  Discussion, Education, Socialization and Support  Summary of Progress/Problems: Feelings around Relapse. Group members discussed the meaning of relapse and shared personal stories of relapse, how it affected them and others, and how they perceived themselves during this time. Group members were encouraged to identify triggers, warning signs and coping skills used when facing the possibility of relapse. Social supports were discussed and explored in detail. Manuel Richards attended group and stayed the entire time. He discussed triggers such as relationships that have caused him to relapse. During group he was slightly disorganized. He would say something on topic but when asked to elaborate he would have difficulties.   Manuel Richards L Manuel Richards MSW, LCSWA  12/22/2014, 10:50 AM

## 2014-12-22 NOTE — Progress Notes (Signed)
D: Patient had 1:1 sitter this morning but order expired and was not renewed due to improvement in patient's mood and behavior. He has remained calm and pleasant today. Denies SI/HI or hallucinations. Appears anxious. A: On q 15 minute checks. Given meds. R: Isolative to room this evening. Pleasant.

## 2014-12-22 NOTE — [facility] Counselor (Signed)
Adult Comprehensive Assessment  Patient ID: Manuel Richards, male   DOB: 1990-09-19, 24 y.o.   MRN: [id number]  Information Source: Information source: Patient  Current Stressors:  Educational / Learning stressors: Did not finish high school  Employment / Job issues: Unemployed.  Family Relationships: None reported  Financial / Lack of resources (include bankruptcy): No income.  Housing / Lack of housing: Pt lives with mother.  Physical health (include injuries & life threatening diseases): None reported  Social relationships: None reported  Substance abuse: Pt abuses opiates and marijuana.  Bereavement / Loss: None reported.   Living/Environment/Situation:  Living Arrangements: Parent Living conditions (as described by patient or guardian): Pt lives with mother.  How long has patient lived in current situation?: "few years"  What is atmosphere in current home: Supportive, Comfortable  Family History:  Marital status: Long term relationship Long term relationship, how long?: 3-4 years What types of issues is patient dealing with in the relationship?: "nothing."  Does patient have children?: Yes How many children?: 1 How is patient's relationship with their children?: Son that will be born in Nov.   Childhood History:  By whom was/is the patient raised?: Mother Additional childhood history information: Father is in and out of jail. No relationship with him.  Description of patient's relationship with caregiver when they were a child: Close relationship with mother.  Patient's description of current relationship with people who raised him/her: Close relationship with mother.  Does patient have siblings?: Yes Number of Siblings: 2 Description of patient's current relationship with siblings: 2 sisters; close relationship.  Did patient suffer any verbal/emotional/physical/sexual abuse as a child?: No Did patient suffer from severe childhood neglect?: No Has patient ever been  sexually abused/assaulted/raped as an adolescent or adult?: No Was the patient ever a victim of a crime or a disaster?: No Witnessed domestic violence?: No Has patient been effected by domestic violence as an adult?: No  Education:  Highest grade of school patient has completed: 11 Currently a Consulting civil engineer?: No Learning disability?: No  Employment/Work Situation:   Employment situation: Unemployed Patient's job has been impacted by current illness: No What is the longest time patient has a held a job?: 7 months Where was the patient employed at that time?: [employer]  Has patient ever been in the [employer]?: No  Financial Resources:   Surveyor, quantity resources: Support from parents / caregiver, No income Does patient have a Lawyer or guardian?: No  Alcohol/Substance Abuse:   What has been your use of drugs/alcohol within the last 12 months?: Pt reports snorting "a few pain pills" for the last week.  Alcohol/Substance Abuse Treatment Hx: Denies past history Has alcohol/substance abuse ever caused legal problems?: No  Social Support System:   Patient's Community Support System: Fair Development worker, community Support System: Mother, girlfriend  Type of faith/religion: Christianity  How does patient's faith help to cope with current illness?: Comfort   Leisure/Recreation:   Leisure and Hobbies: reading, writing   Strengths/Needs:   What things does the patient do well?: Reading, writing.  In what areas does patient struggle / problems for patient: "Nothing, I guess."   Discharge Plan:   Does patient have access to transportation?: Yes Will patient be returning to same living situation after discharge?: Yes Currently receiving community mental health services: No If no, would patient like referral for services when discharged?: Yes (What [place] Does patient have financial barriers related to discharge medications?: Yes Patient description of barriers related to  discharge medications:  No income, no insurance   Summary/Recommendations:   Manuel Richards is a [age] year old male who [facility] with substance induced catatonia. He reports snorting "a few pain pills" daily for the last week and smoking marijuana. He denies using alcohol or other drugs. Currently, he is unemployed and lives with his mother in [place]. During the assessment, he was able to provide limited information. He reports he has a psychiatrist in [place] but does not know why he sees them. He would like a referral to an outpatient substance abuse program. Pt plans to return home and follow up with outpatient. Recommendations include; crisis stabilization, medication management, therapeutic milieu and encourage group attendance and participation.    Jef Futch L Eleonora Peeler MSW, [place]LCSWA . 12/22/2014

## 2014-12-22 NOTE — [facility] Group Notes (Signed)
[facility] LCSW Group Therapy  12/22/2014 2:18 PM  Type of Therapy: Group Therapy  Participation Level: Did Not Attend  Modes of Intervention: Discussion, Education, Socialization and Support  Summary of Progress/Problems: Todays topic: Grudges Patients will be encouraged to discuss their thoughts, feelings, and behaviors as to why one holds on to grudges and reasons why people have grudges. Patients will process the impact of grudges on their daily lives and identify thoughts and feelings related to holding grudges. Patients will identify feelings and thoughts related to what life would look like without grudges.   [name] [name] MSW, LCSWA  12/22/2014, 2:18 PM 

## 2014-12-22 NOTE — Progress Notes (Signed)
[facility]  12/22/2014 11:07 AM Manuel ProwsJoshua D Richards  MRN:  [id number]  Subjective:  Manuel Richards is a 24 year old male with history of substance use admitted for a psychotic break with catatonia and altered mental status in the context of substance use.  Patient was seen for follow-up. He reported that he does not remember the events which led to his admission. He reported that he might be using drugs but it made him delirious and catatonic and he does not remember the events. He stated that he is doing much better and is not experiencing any auditory or visual hallucinations at this time. He also mentioned that he is not experiencing any withdrawal symptoms from opioids at this time. Staff reported that he is doing better and was complaining of some body aches yesterday. He was initially placed on one-to-one but now he is not monitored closely by the staff. Patient reported that his girlfriend is expecting a baby in the end of November and he is excited about the same. He reported that he has history of anxiety in the past. He is not taking medications for the same. He is only on when necessary medications for the withdrawal symptoms.   Principal Problem: Schizophreniform catatonia (HCC)                                   Opioid-induced mood disorder Diagnosis:   Patient Active Problem List   Diagnosis Date Noted  . 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 Opioid use disorder, moderate, dependence (HCC) [F11.20] 12/19/2014  . Cannabis use disorder, moderate, dependence (HCC) [F12.20] 12/19/2014  . Tobacco use disorder [F17.200] 12/19/2014   Total Time spent with patient: 20 minutes  Past Psychiatric History: Substance use.  Past Medical History:  Past Medical History  Diagnosis Date  . Anxiety   . ADHD (attention deficit hyperactivity disorder)    History reviewed. No pertinent past surgical history. Family History: History reviewed. No pertinent family history. Family  Psychiatric  History: None reported. Social History:  History  Alcohol Use  . Yes     History  Drug Use  . Yes  . Special: Cocaine, Marijuana    Social History   Social History  . Marital Status: Single    Spouse Name: N/A  . Number of Children: N/A  . Years of Education: N/A   Social History Main Topics  . Smoking status: Current Every Day Smoker  . Smokeless tobacco: None  . Alcohol Use: Yes  . Drug Use: Yes    Special: Cocaine, Marijuana  . Sexual Activity: Not Asked   Other Topics Concern  . None   Social History Narrative   Additional Social History:                         Sleep: Poor  Appetite:  Poor  Current Medications: Current Facility-Administered Medications  Medication Dose Route Frequency Provider Last Rate Last Dose  . acetaminophen (TYLENOL) tablet 650 mg  650 mg Oral Q6H PRN Audery AmelJohn T Clapacs, MD      . alum & mag hydroxide-simeth (MAALOX/MYLANTA) 200-200-20 MG/5ML suspension 30 mL  30 mL Oral Q4H PRN Audery AmelJohn T Clapacs, MD      . diphenhydrAMINE (BENADRYL) injection 50 mg  50 mg Intramuscular Q2H PRN Audery AmelJohn T Clapacs, MD   50 mg at 12/20/14 2356  . levofloxacin (LEVAQUIN) tablet 500 mg  500 mg Oral Daily Jolanta B Pucilowska, MD   500 mg at 12/22/14 1023  . loperamide (IMODIUM) capsule 2 mg  2 mg Oral PRN Audery Amel, MD      . LORazepam (ATIVAN) injection 1 mg  1 mg Intramuscular Q4H PRN Jolanta B Pucilowska, MD      . LORazepam (ATIVAN) tablet 1 mg  1 mg Oral Q6H PRN Manuel Prows, MD   1 mg at 12/21/14 2137  . magnesium hydroxide (MILK OF MAGNESIA) suspension 30 mL  30 mL Oral Daily PRN Audery Amel, MD      . risperiDONE (RISPERDAL M-TABS) disintegrating tablet 0.5 mg  0.5 mg Oral BID PRN Jolanta B Pucilowska, MD      . traZODone (DESYREL) tablet 100 mg  100 mg Oral QHS Manuel Prows, MD   100 mg at 12/21/14 2138    Lab Results:  No results found for this or any previous visit (from the past 48 hour(s)).  Physical  Findings: AIMS: Facial and Oral Movements Muscles of Facial Expression: None, normal Lips and Perioral Area: None, normal Jaw: None, normal Tongue: None, normal,Extremity Movements Upper (arms, wrists, hands, fingers): None, normal Lower (legs, knees, ankles, toes): None, normal, Trunk Movements Neck, shoulders, hips: None, normal, Overall Severity Severity of abnormal movements (highest score from questions above): None, normal Incapacitation due to abnormal movements: None, normal Patient's awareness of abnormal movements (rate only patient's report): No Awareness, Dental Status Current problems with teeth and/or dentures?: No Does patient usually wear dentures?: No  CIWA:  CIWA-Ar Total: 6 COWS:  COWS Total Score: 10  Musculoskeletal: Strength & Muscle Tone: within normal limits Gait & Station: normal Patient leans: N/A  Psychiatric Specialty Exam: Review of Systems  Unable to perform ROS: acuity of condition  Psychiatric/Behavioral: Positive for depression. The patient is nervous/anxious.   All other systems reviewed and are negative.   Blood pressure 121/88, pulse 139, temperature 97.9 F (36.6 C), temperature source Oral, resp. rate 20, height  (1.727 m), weight 102 lb (46.267 kg), SpO2 98 %.Body mass index is 15.51 kg/(m^2).  General Appearance: Casual  Eye Contact::  None  Speech:  Normal Rate  Volume:  Decreased  Mood:  Anxious and Depressed  Affect:  Appropriate and Congruent  Thought Process:  Coherent  Orientation:  Other:  Oriented to person only  Thought Content:  WDL  Suicidal Thoughts:  No  Homicidal Thoughts:  No  Memory:  Immediate;   Poor Recent;   Poor Remote;   Poor  Judgement:  Poor  Insight:  Lacking  Psychomotor Activity:  Normal  Concentration:  Poor  Recall:  Poor  Fund of Knowledge:Poor  Language: Poor  Akathisia:  No  Handed:  Right  AIMS (if indicated):     Assets:  Physical Health  ADL's:  Intact  Cognition: WNL  Sleep:   Number of Hours: 8   Treatment Plan Summary: Daily contact with patient to assess and evaluate symptoms and progress in treatment and Medication management   Manuel Richards is a 24 year old male with no past psychiatric history except for substance use was admitted for bizarre, psychotic, catatonic behavior most likely in the context of substance use. He was positive for cannabis and opiates on admission but his mother believes that he was using acid and mushrooms. Patient currently denied having any withdrawal symptoms at this time.  1. Catatonia. No evidence of catatonia noted at this time.  2. Poor oral oral intake- resolved  3. Substance use. He's positive for cannabinoids and opiates. There are no symptoms of opioid withdrawal so far. We'll continue to monitor. We will refer him to [facility] rehab program.   4. Smoking. Will continue nicotine patch.   5. UTI. Will get culture and start Levaquin.  6. Insomnia. He slept extremely poorly last night in spite of medications. We will offer trazodone.   7. Disposition. He will likely return to home with his girlfriend. He will follow up with [facility].  Brandy Hale 12/22/2014, 11:07 AM

## 2014-12-22 NOTE — [facility] Group Notes (Signed)
[facility] Group Notes:  (Nursing/MHT/Case Management/Adjunct)  Date:  12/22/2014  Time:  10:47 PM  Type of Therapy:  Evening Wrap-up Group  Participation Level:  Active  Participation Quality:  Appropriate  Affect:  Appropriate  Cognitive:  Alert  Insight:  Appropriate  Engagement in Group:  Engaged  Modes of Intervention:  Discussion  Summary of Progress/Problems:  Manuel MorrowChelsea Nanta Gianno Richards 12/22/2014, 10:47 PM

## 2014-12-22 NOTE — [facility] Group Notes (Signed)
[facility]   12/22/2014 11:31 AM (late entry for 12/21/2014)   Participation Quality:  Did not attend.   Manuel Richards L Rosy Estabrook MSW, [address]CSWA

## 2014-12-22 NOTE — Plan of Care (Signed)
Problem: Alteration in thought process Goal: LTG-Patient behavior demonstrates decreased signs psychosis (Patient behavior demonstrates decreased signs of psychosis to the point the patient is safe to return home and continue treatment in an outpatient setting.)  Outcome: Progressing Patient calm and cooperative. Denies sx of psychosis.

## 2014-12-23 LAB — URINE CULTURE

## 2014-12-23 NOTE — Plan of Care (Signed)
Problem: Ineffective individual coping Goal: LTG: Patient will report a decrease in negative feelings Outcome: Progressing Pt reports mood improving, bright during interaction, denies SI/HI/AVH.

## 2014-12-23 NOTE — Plan of Care (Signed)
Problem: Alteration in thought process Goal: STG-Patient does not respond to command hallucinations Outcome: Progressing Denies suicidal ideation & AV hallucination.

## 2014-12-23 NOTE — Progress Notes (Signed)
[facility] MD Progress Note  12/23/2014 4:08 PM Manuel Richards  MRN:  [id number]  Subjective:  Manuel Richards is a 24 year old male with history of substance use admitted for a psychotic break with catatonia and altered mental status in the context of substance use.  Patient was seen for follow-up. He  appeared much calmer and alert and does not have any withdrawal symptoms. He was sleeping with other peers in the courtyard outside. He reported that he sleeping well. He currently denied having any depression anxiety or paranoia. He denied having any suicidal homicidal ideations or plans.  Principal Problem: Schizophreniform catatonia (HCC)                                   Opioid-induced mood disorder Diagnosis:   Patient Active Problem List   Diagnosis Date Noted  . 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 Opioid use disorder, moderate, dependence (HCC) [F11.20] 12/19/2014  . Cannabis use disorder, moderate, dependence (HCC) [F12.20] 12/19/2014  . Tobacco use disorder [F17.200] 12/19/2014   Total Time spent with patient: 20 minutes  Past Psychiatric History: Substance use.  Past Medical History:  Past Medical History  Diagnosis Date  . Anxiety   . ADHD (attention deficit hyperactivity disorder)    History reviewed. No pertinent past surgical history. Family History: History reviewed. No pertinent family history. Family Psychiatric  History: None reported. Social History:  History  Alcohol Use  . Yes     History  Drug Use  . Yes  . Special: Cocaine, Marijuana    Social History   Social History  . Marital Status: Single    Spouse Name: N/A  . Number of Children: N/A  . Years of Education: N/A   Social History Main Topics  . Smoking status: Current Every Day Smoker  . Smokeless tobacco: None  . Alcohol Use: Yes  . Drug Use: Yes    Special: Cocaine, Marijuana  . Sexual Activity: Not Asked   Other Topics Concern  . None   Social History Narrative    Additional Social History:                         Sleep: Poor  Appetite:  Poor  Current Medications: Current Facility-Administered Medications  Medication Dose Route Frequency Provider Last Rate Last Dose  . acetaminophen (TYLENOL) tablet 650 mg  650 mg Oral Q6H PRN Audery Amel, MD      . alum & mag hydroxide-simeth (MAALOX/MYLANTA) 200-200-20 MG/5ML suspension 30 mL  30 mL Oral Q4H PRN Audery Amel, MD      . citalopram (CELEXA) tablet 20 mg  20 mg Oral Daily Brandy Hale, MD   20 mg at 12/23/14 0903  . diphenhydrAMINE (BENADRYL) injection 50 mg  50 mg Intramuscular Q2H PRN Audery Amel, MD   50 mg at 12/20/14 2356  . levofloxacin (LEVAQUIN) tablet 500 mg  500 mg Oral Daily Shari Prows, MD   500 mg at 12/23/14 0903  . loperamide (IMODIUM) capsule 2 mg  2 mg Oral PRN Audery Amel, MD      . LORazepam (ATIVAN) tablet 0.5 mg  0.5 mg Oral BID Brandy Hale, MD   0.5 mg at 12/23/14 0903  . magnesium hydroxide (MILK OF MAGNESIA) suspension 30 mL  30 mL Oral Daily PRN Audery Amel, MD      . risperiDONE (RISPERDAL  M-TABS) disintegrating tablet 0.5 mg  0.5 mg Oral BID PRN Shari ProwsJolanta B Pucilowska, MD   0.5 mg at 12/23/14 1443  . traZODone (DESYREL) tablet 100 mg  100 mg Oral QHS Shari ProwsJolanta B Pucilowska, MD   100 mg at 12/22/14 2106    Lab Results:  No results found for this or any previous visit (from the past 48 hour(s)).  Physical Findings: AIMS: Facial and Oral Movements Muscles of Facial Expression: None, normal Lips and Perioral Area: None, normal Jaw: None, normal Tongue: None, normal,Extremity Movements Upper (arms, wrists, hands, fingers): None, normal Lower (legs, knees, ankles, toes): None, normal, Trunk Movements Neck, shoulders, hips: None, normal, Overall Severity Severity of abnormal movements (highest score from questions above): None, normal Incapacitation due to abnormal movements: None, normal Patient's awareness of abnormal movements (rate only  patient's report): No Awareness, Dental Status Current problems with teeth and/or dentures?: No Does patient usually wear dentures?: No  CIWA:  CIWA-Ar Total: 6 COWS:  COWS Total Score: 10  Musculoskeletal: Strength & Muscle Tone: within normal limits Gait & Station: normal Patient leans: N/A  Psychiatric Specialty Exam: Review of Systems  Unable to perform ROS: acuity of condition  Psychiatric/Behavioral: Positive for depression. The patient is nervous/anxious.   All other systems reviewed and are negative.   Blood pressure 143/93, pulse 104, temperature 98.4 F (36.9 C), temperature source Oral, resp. rate 20, height 5\' 8"  (1.727 m), weight 102 lb (46.267 kg), SpO2 98 %.Body mass index is 15.51 kg/(m^2).  General Appearance: Casual  Eye Contact::  None  Speech:  Normal Rate  Volume:  Decreased  Mood:  Anxious and Depressed  Affect:  Appropriate and Congruent  Thought Process:  Coherent  Orientation:  Other:  Oriented to person only  Thought Content:  WDL  Suicidal Thoughts:  No  Homicidal Thoughts:  No  Memory:  Immediate;   Poor Recent;   Poor Remote;   Poor  Judgement:  Poor  Insight:  Lacking  Psychomotor Activity:  Normal  Concentration:  Poor  Recall:  Poor  Fund of Knowledge:Poor  Language: Poor  Akathisia:  No  Handed:  Right  AIMS (if indicated):     Assets:  Physical Health  ADL's:  Intact  Cognition: WNL  Sleep:  Number of Hours: 5.75   Treatment Plan Summary: Daily contact with patient to assess and evaluate symptoms and progress in treatment and Medication management   Manuel Richards is a 24 year old male with no past psychiatric history except for substance use was admitted for bizarre, psychotic, catatonic behavior most likely in the context of substance use. He was positive for cannabis and opiates on admission but his mother believes that he was using acid and mushrooms. Patient currently denied having any withdrawal symptoms at this time.  1.  Catatonia. Resolved   2. Poor oral oral intake- resolved  3. Substance use. He's positive for cannabinoids and opiates. There are no symptoms of opioid withdrawal so far. We'll continue to monitor. We will refer him to [facility] rehab program.   4. Smoking. Will continue nicotine patch.   5. UTI. Will get culture and start Levaquin.  6. Insomnia.We will offer trazodone.   7. Disposition. He will likely return to home with his girlfriend. He will follow up with [facility].  Brandy HaleUzma Tkai Large 12/23/2014, 4:08 PM

## 2014-12-23 NOTE — Progress Notes (Signed)
D:  Pt reports improvement in his mood, bright during interaction, looking forward to discharging, states that his fiance is pregnant and due at the end of November, fiance came to visit this evening and pt states that it was a good visit.  A:  Emotional support provided, Encouraged pt to continue with treatment plan and attend all group activities, q15 min checks maintained for safety.  R:  Pt is receptive, pleasant and cooperative with staff and other patients on the unit, going to groups.

## 2014-12-23 NOTE — Progress Notes (Signed)
He is pleasant & cooperative.Denies suicidal or homicidal ideation.Appropriate with staff & peers.Compliant with medications.Attending groups.Looking forward for discharge tomorrow.

## 2014-12-23 NOTE — [facility] Group Notes (Signed)
[facility] LCSW Group Therapy  12/23/2014 2:15 PM  Type of Therapy:  Group Therapy  Participation Level:  Active  Participation Quality:  Attentive  Affect:  Appropriate  Cognitive:  Alert  Insight:  Improving  Engagement in Therapy:  Improving  Modes of Intervention:  Discussion, Education, Socialization and Support  Summary of Progress/Problems: Balance in life: Patients will discuss the concept of balance and how it looks and feels to be unbalanced. Pt will identify areas in their life that is unbalanced and ways to become more balanced.  Manuel Richards attended group and stayed the entire time. He discussed how relationships and substance abuse caused him to be off balance.   Manuel Richards L Manuel Richards MSW, LCSWA  12/23/2014, 2:15 PM

## 2014-12-24 DIAGNOSIS — F202 Catatonic schizophrenia: Secondary | ICD-10-CM | POA: Insufficient documentation

## 2014-12-24 MED ORDER — CITALOPRAM HYDROBROMIDE 20 MG PO TABS: 20.0000 mg | ORAL_TABLET | Freq: Every day | ORAL | Status: DC

## 2014-12-24 MED ORDER — TRAZODONE HCL 100 MG PO TABS: 100.0000 mg | ORAL_TABLET | Freq: Every day | ORAL | Status: DC

## 2014-12-24 MED ORDER — LEVOFLOXACIN 500 MG PO TABS: 500.0000 mg | ORAL_TABLET | Freq: Every day | ORAL | Status: DC

## 2014-12-24 NOTE — Progress Notes (Signed)
D: Patient denies SI/HI/AVH.  Patient affect is anxious and his mood is pleasant.  Patient apologized for his behavior during the initial days of his admission. Patient is hoping to discharge today.  Patient did attend evening group. Patient visible on the milieu. No distress noted. A: Support and encouragement offered. Scheduled medications given to pt. Q 15 min checks continued for patient safety. R: Patient receptive. Patient remains safe on the unit.

## 2014-12-24 NOTE — Progress Notes (Signed)
Recreation Therapy Notes  INPATIENT RECREATION TR PLAN  Patient Details Name: TRAESON DUSZA MRN: [id number] DOB: 15-Jun-1990 Today's Date: 12/24/2014  Rec Therapy Plan Is patient appropriate for Therapeutic Recreation?: Yes Treatment times per week: At least once a week TR Treatment/Interventions: 1:1 session, Group participation (Comment) (Appropriate participation in daily recreation therapy tx)  Discharge Criteria Pt will be discharged from therapy if:: Treatment goals are met, Discharged Treatment plan/goals/alternatives discussed and agreed upon by:: Patient/family  Discharge Summary Short term goals set: See Care Plan Short term goals met: Complete Progress toward goals comments: One-to-one attended One-to-one attended: Self-esteem, stress management, coping skills Reason goals not met: N/A Therapeutic equipment acquired: None Reason patient discharged from therapy: Discharge from hospital Pt/family agrees with progress & goals achieved: Yes Date patient discharged from therapy: 12/24/14   Leonette Monarch, LRT/CTRS 12/24/2014, 4:47 PM

## 2014-12-24 NOTE — Progress Notes (Signed)
  [facility]  Will you be returning to the same living situation after discharge:  Yes,  home with fiance Cyril Mourningebekah Cushman At discharge, do you have transportation home?: Yes,  patient's fiance will pick up Do you have the ability to pay for your medications: No. [facility] [phone]  Release of information consent forms completed and in the chart;  Patient's signature needed at discharge.  Patient to Follow up at: Follow-up Information    Follow up with [facility]. Go on 12/26/2014.   Why:  For follow-up care appt Wednesday 12/26/14 at 7:00 am (walk in hours MWF 8-3)  Lorella NimrodHarvey [phone]   Contact information:   [address] [place] Ph [phone] Fax [phone]      Next level of care provider [facility] Link:no  Patient denies SI/HI: Yes,  patient denies SI/HI    Safety Planning and Suicide Prevention discussed: Yes,  SPE discussed with patient and his fiance Cyril MourningRebekah Cushman [phone]  Have you used any form of tobacco in the last 30 days? (Cigarettes, Smokeless Tobacco, Cigars, and/or Pipes): Yes  Has patient been referred to the Quitline?: Patient refused referral  Lulu Ridingngle, Callee Rohrig T, MSW, LCSWA 12/24/2014, 11:43 AM

## 2014-12-24 NOTE — Plan of Care (Signed)
Problem: [facility] Participation in Recreation Therapeutic Interventions Goal: STG-Patient will demonstrate improved self esteem by identif STG: Self-Esteem - Within 3 treatment sessions, patient will verbalize at least 5 positive affirmation statements in one treatment session to increase self-esteem post d/c.  Outcome: Completed/Met Date Met:  12/24/14 Treatment Session 1; Completed 1 out of 1: At approximately 11:50 am, LRT met with patient in patient room. Patient verbalized 5 positive affirmation statements. Patient reported it felt "pretty good". LRT encouraged patient to continue saying positive affirmation statements. Intervention Used: I Am statements  Leonette Monarch, LRT/CTRS 11.07.16 4:42 pm Goal: STG-Patient will identify at least five coping skills for ** STG: Coping Skills - Within 3 treatment sessions, patient will verbalize at least 5 coping skills for substance abuse in one treatment session to decrease substance abuse post d/c.  Outcome: Completed/Met Date Met:  12/24/14 Treatment Session 1; Completed 1 out of 1: At approximately 11:50 am, LRT met with patient in patient room. Patient verbalized 5 coping skills for substance abuse. LRT educated patient on leisure and why it is important to implement into his schedule. LRT educated and provided patient with blank schedules to help him plan his day and try to avoid using substances. LRT educated patient on healthy support systems. Intervention Used: Coping skills worksheet  Leonette Monarch, LRT/CTRS 11.07.16 4:44 pm Goal: STG-Other Recreation Therapy Goal (Specify) STG: Stress Management - Within 3 treatment sessions, patient will verbalize understanding of the stress management techniques in one treatment session to increase stress management skills post d/c.  Outcome: Completed/Met Date Met:  12/24/14 Treatment Session 1; Completed 1 out of 1: At approximately 11:50 am, LRT met with patient in patient room. LRT educated and  provided patient with handouts on stress management techniques. Patient verbalized understanding. LRT encouraged patient to read over and practice the stress management techniques. Intervention Used: Mindfulness and Progressive Muscle Relaxation handouts  Leonette Monarch, LRT/CTRS 11.07.16 4:46 pm

## 2014-12-24 NOTE — [facility] Suicide Risk Assessment (Signed)
[facility] INPATIENT:  Family/Significant Other Suicide Prevention Education  Suicide Prevention Education:  Education Completed; Cyril MourningRebekah Cushman (fiance) [phone] has been identified by the patient as the family member/significant other with whom the patient will be residing, and identified as the person(s) who will aid the patient in the event of a mental health crisis (suicidal ideations/suicide attempt).  With written consent from the patient, the family member/significant other has been provided the following suicide prevention education, prior to the and/or following the discharge of the patient.  The suicide prevention education provided includes the following:  Suicide risk factors  Suicide prevention and interventions  National Suicide Hotline telephone number  [facility] assessment telephone number  [facility] Assistance 911  [facility] and/or Residential Mobile Crisis Unit telephone number  Request made of family/significant other to:  Remove weapons (e.g., guns, rifles, knives), all items previously/currently identified as safety concern.    Remove drugs/medications (over-the-counter, prescriptions, illicit drugs), all items previously/currently identified as a safety concern.  The family member/significant other verbalizes understanding of the suicide prevention education information provided.  The family member/significant other agrees to remove the items of safety concern listed above.  Lulu RidingIngle, Jarae Panas T, MSW, LCSWA 12/24/2014, 11:42 AM

## 2014-12-24 NOTE — [facility] Suicide Risk Assessment (Signed)
[facility]   Demographic Factors:  Male, Adolescent or young adult, Caucasian, Low socioeconomic status and Unemployed  Total Time spent with patient: 30 minutes  Musculoskeletal: Strength & Muscle Tone: within normal limits Gait & Station: normal Patient leans: N/A  Psychiatric Specialty Exam: Physical Exam  Nursing note and vitals reviewed.   Review of Systems  All other systems reviewed and are negative.   Blood pressure 135/92, pulse 99, temperature 97.8 F (36.6 C), temperature source Oral, resp. rate 20, height 5\' 8"  (1.727 m), weight 46.267 kg (102 lb), SpO2 98 %.Body mass index is 15.51 kg/(m^2).  General Appearance: Casual  Eye Contact::  Good  Speech:  Clear and Coherent409  Volume:  Normal  Mood:  Anxious  Affect:  Appropriate  Thought Process:  Goal Directed  Orientation:  Full (Time, Place, and Person)  Thought Content:  WDL  Suicidal Thoughts:  No  Homicidal Thoughts:  No  Memory:  Immediate;   Fair Recent;   Fair Remote;   Fair  Judgement:  Impaired  Insight:  Fair  Psychomotor Activity:  Normal  Concentration:  Fair  Recall:  F[employer]Fair  Fund of Knowledge:Fair  Language: Fair  Akathisia:  No  Handed:  Right  AIMS (if indicated):     Assets:  Communication Skills Desire for Improvement Housing Intimacy Physical Health Resilience Social Support  Sleep:  Number of Hours: 5.25  Cognition: WNL  ADL's:  Intact   Have you used any form of tobacco in the last 30 days? (Cigarettes, Smokeless Tobacco, Cigars, and/or Pipes): Yes  Has this patient used any form of tobacco in the last 30 days? (Cigarettes, Smokeless Tobacco, Cigars, and/or Pipes) Yes, A prescription for an FDA-approved tobacco cessation medication was offered at discharge and the patient refused  Mental Status Per Nursing Assessment::   On Admission:     Current Mental Status by Physician: NA  Loss Factors: Financial problems/change in socioeconomic  status  Historical Factors: Impulsivity  Risk Reduction Factors:   Responsible for children under 24 years of age, Sense of responsibility to family, Living with another person, especially a relative and Positive social support  Continued Clinical Symptoms:  Alcohol/Substance Abuse/Dependencies  Cognitive Features That Contribute To Risk:  None    Suicide Risk:  Minimal: No identifiable suicidal ideation.  Patients presenting with no risk factors but with morbid ruminations; may be classified as minimal risk based on the severity of the depressive symptoms  Principal Problem: Substance-induced psychotic disorder with hallucinations [facility](HCC) Discharge Diagnoses:  Patient Active Problem List   Diagnosis Date Noted  . 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 Opioid use disorder, moderate, dependence (HCC) [F11.20] 12/19/2014  . Cannabis use disorder, moderate, dependence (HCC) [F12.20] 12/19/2014  . Tobacco use disorder [F17.200] 12/19/2014      Plan Of Care/Follow-up recommendations:  Activity:  As tolerated. Diet:  Regular. Other:  Keep follow-up appointments.  Is patient on multiple antipsychotic therapies at discharge:  No   Has Patient had three or more failed trials of antipsychotic monotherapy by history:  No  Recommended Plan for Multiple Antipsychotic Therapies: NA    Amiir Heckard 12/24/2014, 10:31 AM

## 2014-12-24 NOTE — Progress Notes (Signed)
Pt. Remained cooperative with routine. His discharge medications and instructions discussed with pt. And he verbalized understanding. Pt. Kept apologizing for his behavior at admission and stated that he was not going to do drugs any more. He was noted pacing a bit while he was waiting for his ride. Pt. Did not have any belongings in the locker but took his belongings in the room. He was escorted to the lobby where his girlfriend was waiting for him.

## 2014-12-24 NOTE — Discharge Summary (Signed)
Physician Discharge Summary Note  Patient:  Manuel Richards is an 24 y.o., male MRN:  [id number] DOB:  12-Oct-1990 Patient phone:  [phone] (home)  Patient address:   [address] [place] [address],  Total Time spent with patient: 30 minutes  Date of Admission:  12/19/2014 Date of Discharge: 12/24/2014  Reason for Admission:  Psychosis.  Identifying data. Manuel Richards is a 24 year old male with history of substance use.  Chief complaint. The patient is unable to state.  History of present illness. Information is obtained exclusively from the chart as the patient is unable to participate in the interview. Reportedly Manuel Richards has no psychiatrist past except for substance use. He was brought to the hospital by his girlfriend for bizarre behavior for the past 2 days. The girlfriend denies any recent substance use by the patient his mother however is convinced that he's been using acid and mushrooms. His urine tox screen on admission is positive for cannabinoids and opiates. The patient was slightly agitated in the emergency room. By the time he was admitted to psychiatry ward he was no longer agitated behavior strangely, hiding behind curtains on the window seal, trying to escape, but mostly confused. He was not able to answer simple questions. She is unable to swallow medication food or water. He is catatonic. He answers no questions, he is restless, he started posturing in the corner of the hallway. He had to be transferred to the "" back hall", he has security guard for safety.   Past psychiatric history. He reportedly was evaluated for substance use once. There were no prior hospitalizations for mental illness or substance use. There were no suicide attempts.   Family psychiatric history none reported.   Social history. He lives with a girlfriend. His mother is involved. He is employed. The family has never seen him acting like that before.   Principal Problem: Substance-induced  psychotic disorder with hallucinations [facility]) Discharge Diagnoses: Patient Active Problem List   Diagnosis Date Noted  . Substance-induced psychotic disorder with hallucinations ([place]) [F19.951] 12/19/2014  . Opioid use disorder, moderate, dependence ([place]) [F11.20] 12/19/2014  . Cannabis use disorder, moderate, dependence ([place]) [F12.20] 12/19/2014  . Tobacco use disorder [F17.200] 12/19/2014    Musculoskeletal: Strength & Muscle Tone: within normal limits Gait & Station: normal Patient leans: N/A  Psychiatric Specialty Exam: Physical Exam  Nursing note and vitals reviewed.   Review of Systems  Psychiatric/Behavioral: The patient is nervous/anxious.   All other systems reviewed and are negative.   Blood pressure 135/92, pulse 99, temperature 97.8 F (36.6 C), temperature source Oral, resp. rate 20, height '5\' 8"'  (1.727 m), weight 46.267 kg (102 lb), SpO2 98 %.Body mass index is 15.51 kg/(m^2).  See SRA.                                                  Sleep:  Number of Hours: 5.25   Have you used any form of tobacco in the last 30 days? (Cigarettes, Smokeless Tobacco, Cigars, and/or Pipes): Yes  Has this patient used any form of tobacco in the last 30 days? (Cigarettes, Smokeless Tobacco, Cigars, and/or Pipes) Yes, A prescription for an FDA-approved tobacco cessation medication was offered at discharge and the patient refused  Past Medical History:  Past Medical History  Diagnosis Date  . Anxiety   .  ADHD (attention deficit hyperactivity disorder)    History reviewed. No pertinent past surgical history. Family History: History reviewed. No pertinent family history. Social History:  History  Alcohol Use  . Yes     History  Drug Use  . Yes  . Special: Cocaine, Marijuana    Social History   Social History  . Marital Status: Single    Spouse Name: N/A  . Number of Children: N/A  . Years of Education: N/A   Social History Main Topics  . Smoking  status: Current Every Day Smoker  . Smokeless tobacco: None  . Alcohol Use: Yes  . Drug Use: Yes    Special: Cocaine, Marijuana  . Sexual Activity: Not Asked   Other Topics Concern  . None   Social History Narrative    Past Psychiatric History: Hospitalizations:  Outpatient Care:  Substance Abuse Care:  Self-Mutilation:  Suicidal Attempts:  Violent Behaviors:   Risk to Self: Is patient at risk for suicide?: No What has been your use of drugs/alcohol within the last 12 months?: Pt reports snorting "a few pain pills" for the last week.  Risk to Others:   Prior Inpatient Therapy:   Prior Outpatient Therapy:    Level of Care:  OP  Hospital Course:    Manuel Richards is a 24 year old male with no past psychiatric history except for substance use was admitted for bizarre, psychotic, catatonic behavior most likely in the context of substance use. He was positive for cannabis and opiates on admission but his mother believes that he was using acid and mushrooms. Patient currently denied having any withdrawal symptoms at this time.  1. Psychosis and catatonia. The patient was treated with low-dose Risperdal with full resolution of symptoms.   2. Poor oral oral intake. Resolved  3. Substance use. The patient was positive for cannabinoids and opiates on admission but the mother believes that he was using acid and mushrooms.. There were no symptoms of opioid withdrawal. The patient was agreeable to residential treatment program. He was referred to [facility] rehab program but no beds were available. He met with Sherrian Divers to discuss [facility] IOP. Marland Kitchen   4. Smoking.  nicotine patch was available.    5. UTI. We started Levaquin. Urine culture showed insignificant growth.  6. Insomnia.We  offered trazodone.   7. Disposition. He was discharged to home with his girlfriend. He will follow up with [facility].  Consults:  None  Significant Diagnostic Studies:  None  Discharge Vitals:   Blood pressure 135/92,  pulse 99, temperature 97.8 F (36.6 C), temperature source Oral, resp. rate 20, height '5\' 8"'  (1.727 m), weight 46.267 kg (102 lb), SpO2 98 %. Body mass index is 15.51 kg/(m^2). Lab Results:   Results for orders placed or performed during the hospital encounter of 12/19/14 (from the past 72 hour(s))  Urine culture     Status: None   Collection Time: 12/21/14  2:47 PM  Result Value Ref Range   Specimen Description URINE, RANDOM    Special Requests NONE    Culture INSIGNIFICANT GROWTH    Report Status 12/23/2014 FINAL     Physical Findings: AIMS: Facial and Oral Movements Muscles of Facial Expression: None, normal Lips and Perioral Area: None, normal Jaw: None, normal Tongue: None, normal,Extremity Movements Upper (arms, wrists, hands, fingers): None, normal Lower (legs, knees, ankles, toes): None, normal, Trunk Movements Neck, shoulders, hips: None, normal, Overall Severity Severity of abnormal movements (highest score from questions above): None, normal Incapacitation due to  abnormal movements: None, normal Patient's awareness of abnormal movements (rate only patient's report): No Awareness, Dental Status Current problems with teeth and/or dentures?: No Does patient usually wear dentures?: No  CIWA:  CIWA-Ar Total: 6 COWS:  COWS Total Score: 10   See Psychiatric Specialty Exam and Suicide Risk Assessment completed by Attending Physician prior to discharge.  Discharge destination:  Home  Is patient on multiple antipsychotic therapies at discharge:  No   Has Patient had three or more failed trials of antipsychotic monotherapy by history:  No    Recommended Plan for Multiple Antipsychotic Therapies: NA  Discharge Instructions    Diet - low sodium heart healthy    Complete by:  As directed      Increase activity slowly    Complete by:  As directed             Medication List    TAKE these medications      Indication   citalopram 20 MG tablet  Commonly known as:   CELEXA  Take 1 tablet (20 mg total) by mouth daily.   Indication:  Depression     levofloxacin 500 MG tablet  Commonly known as:  LEVAQUIN  Take 1 tablet (500 mg total) by mouth daily.   Indication:  Infection of Genitals and Urinary Tract     traZODone 100 MG tablet  Commonly known as:  DESYREL  Take 1 tablet (100 mg total) by mouth at bedtime.   Indication:  Trouble Sleeping         Follow-up recommendations:  Activity:  As tolerated. Diet:  Regular. Other:  Keep follow-up appointments.  Comments:    Total Discharge Time: 35 min.  Signed: Orson Slick 12/24/2014, 10:34 AM

## 2014-12-24 NOTE — BHH Group Notes (Signed)
B[facility] Group Notes:  (Nursing/MHT/Case Management/Adjunct)  Date:  12/24/2014  Time:  1:41 PM  Type of Therapy:  Psychoeducational Skills  Participation Level:  Active  Participation Quality:  Appropriate, Attentive and Supportive  Affect:  Appropriate  Cognitive:  Appropriate  Insight:  Appropriate  Engagement in Group:  Supportive  Modes of Intervention:  Clarification, Discussion and Education  Summary of Progress/Problems:  Manuel Richards 12/24/2014, 1:41 PM

## 2015-09-01 ENCOUNTER — Encounter: Payer: Self-pay | Admitting: Emergency Medicine

## 2015-09-01 ENCOUNTER — Emergency Department
Admission: EM | Admit: 2015-09-01 | Discharge: 2015-09-05 | Disposition: A | Discharge status: Other Acute Inpatient Hospital | Payer: Medicaid Other | Attending: Emergency Medicine | Admitting: Emergency Medicine

## 2015-09-01 DIAGNOSIS — F1121 Opioid dependence, in remission: Secondary | ICD-10-CM | POA: Diagnosis present

## 2015-09-01 DIAGNOSIS — F19151 Other psychoactive substance abuse with psychoactive substance-induced psychotic disorder with hallucinations: Secondary | ICD-10-CM | POA: Insufficient documentation

## 2015-09-01 DIAGNOSIS — F29 Unspecified psychosis not due to a substance or known physiological condition: Secondary | ICD-10-CM | POA: Insufficient documentation

## 2015-09-01 DIAGNOSIS — F202 Catatonic schizophrenia: Secondary | ICD-10-CM | POA: Diagnosis present

## 2015-09-01 DIAGNOSIS — F1220 Cannabis dependence, uncomplicated: Secondary | ICD-10-CM | POA: Diagnosis present

## 2015-09-01 DIAGNOSIS — F1120 Opioid dependence, uncomplicated: Secondary | ICD-10-CM | POA: Diagnosis present

## 2015-09-01 DIAGNOSIS — F17200 Nicotine dependence, unspecified, uncomplicated: Secondary | ICD-10-CM | POA: Insufficient documentation

## 2015-09-01 DIAGNOSIS — F19951 Other psychoactive substance use, unspecified with psychoactive substance-induced psychotic disorder with hallucinations: Secondary | ICD-10-CM | POA: Diagnosis present

## 2015-09-01 DIAGNOSIS — F909 Attention-deficit hyperactivity disorder, unspecified type: Secondary | ICD-10-CM | POA: Insufficient documentation

## 2015-09-01 DIAGNOSIS — Z5181 Encounter for therapeutic drug level monitoring: Secondary | ICD-10-CM | POA: Insufficient documentation

## 2015-09-01 DIAGNOSIS — F1290 Cannabis use, unspecified, uncomplicated: Secondary | ICD-10-CM | POA: Insufficient documentation

## 2015-09-01 LAB — COMPREHENSIVE METABOLIC PANEL
ALK PHOS: 80 U/L (ref 38–126)
ALT: 39 U/L (ref 17–63)
AST: 21 U/L (ref 15–41)
Albumin: 5 g/dL (ref 3.5–5.0)
Anion gap: 11 (ref 5–15)
BILIRUBIN TOTAL: 0.9 mg/dL (ref 0.3–1.2)
BUN: 11 mg/dL (ref 6–20)
CO2: 27 mmol/L (ref 22–32)
CREATININE: 0.95 mg/dL (ref 0.61–1.24)
Calcium: 9.8 mg/dL (ref 8.9–10.3)
Chloride: 99 mmol/L — ABNORMAL LOW (ref 101–111)
GFR calc Af Amer: >60 mL/min (ref >60)
GLUCOSE: 148 mg/dL — AB (ref 65–99)
Potassium: 3.5 mmol/L (ref 3.5–5.1)
Sodium: 137 mmol/L (ref 135–145)
TOTAL PROTEIN: 8.2 g/dL — AB (ref 6.5–8.1)

## 2015-09-01 LAB — URINE DRUG SCREEN, QUALITATIVE (ARMC ONLY)
Amphetamines, Ur Screen: NOT DETECTED
Barbiturates, Ur Screen: NOT DETECTED
Benzodiazepine, Ur Scrn: NOT DETECTED
CANNABINOID 50 NG, UR ~~LOC~~: POSITIVE — AB
COCAINE METABOLITE, UR ~~LOC~~: NOT DETECTED
MDMA (ECSTASY) UR SCREEN: NOT DETECTED
METHADONE SCREEN, URINE: NOT DETECTED
Opiate, Ur Screen: NOT DETECTED
Phencyclidine (PCP) Ur S: NOT DETECTED
TRICYCLIC, UR SCREEN: NOT DETECTED

## 2015-09-01 LAB — ACETAMINOPHEN LEVEL

## 2015-09-01 LAB — CBC
HCT: 47.9 % (ref 40.0–52.0)
HEMOGLOBIN: 16.2 g/dL (ref 13.0–18.0)
MCH: 30.7 pg (ref 26.0–34.0)
MCHC: 33.9 g/dL (ref 32.0–36.0)
MCV: 90.8 fL (ref 80.0–100.0)
PLATELETS: 393 10*3/uL (ref 150–440)
RBC: 5.28 MIL/uL (ref 4.40–5.90)
RDW: 12.6 % (ref 11.5–14.5)
WBC: 9.7 10*3/uL (ref 3.8–10.6)

## 2015-09-01 LAB — ETHANOL

## 2015-09-01 LAB — SALICYLATE LEVEL

## 2015-09-01 NOTE — ED Provider Notes (Signed)
[facility] Emergency Department Provider Note    ____________________________________________  Time seen: ~2230  I have reviewed the triage vital signs and the nursing notes.   HISTORY  Chief Complaint Psychiatric Evaluation   History limited by: Altered Mental Status   HPI Manuel Richards is a 25 y.o. male who presents to the emergency department today the request of the significant other because of bizarre behavior. He has been acting bizarre for the past few days. He tells me that he has been having hallucinations. He has been having thoughts of wanting to hurt himself and others. He denies acting on these thoughts. Per chart review he has had history of psychosis, it does appear that it is somewhat related to substance abuse. Fortunately it sounds like the patient is been noncompliant with any medications. He denies any medical complaints.   Past Medical History  Diagnosis Date  . Anxiety   . ADHD (attention deficit hyperactivity disorder)     Patient Active Problem List   Diagnosis Date Noted  . Schizophreniform catatonia (HCC)   . Substance-induced psychotic disorder with hallucinations (HCC) 12/19/2014  . Opioid use disorder, moderate, dependence (HCC) 12/19/2014  . Cannabis use disorder, moderate, dependence (HCC) 12/19/2014  . Tobacco use disorder 12/19/2014    History reviewed. No pertinent past surgical history.  Current Outpatient Rx  Name  Route  Sig  Dispense  Refill  . citalopram (CELEXA) 20 MG tablet   Oral   Take 1 tablet (20 mg total) by mouth daily.   30 tablet   0   . levofloxacin (LEVAQUIN) 500 MG tablet   Oral   Take 1 tablet (500 mg total) by mouth daily.   10 tablet   0   . traZODone (DESYREL) 100 MG tablet   Oral   Take 1 tablet (100 mg total) by mouth at bedtime.   30 tablet   0     Allergies Penicillins  History reviewed. No pertinent family history.  Social History Social History  Substance Use  Topics  . Smoking status: Current Every Day Smoker  . Smokeless tobacco: Never Used  . Alcohol Use: Yes    Review of Systems  Constitutional: Negative for fever. Cardiovascular: Negative for chest pain. Respiratory: Negative for shortness of breath. Gastrointestinal: Negative for abdominal pain, vomiting and diarrhea. Neurological: Negative for headaches, focal weakness or numbness.   10-point ROS otherwise negative.  ____________________________________________   PHYSICAL EXAM:  VITAL SIGNS: ED Triage Vitals  Enc Vitals Group     BP 09/01/15 2111 150/103 mmHg     Pulse Rate 09/01/15 2111 120     Resp 09/01/15 2111 18     Temp 09/01/15 2111 99 F (37.2 C)     Temp Source 09/01/15 2111 Oral     SpO2 09/01/15 2111 100 %     Weight 09/01/15 2111 115 lb (52.164 kg)     Height 09/01/15 2111  (1.626 m)   Constitutional: Alert and oriented. Well appearing and in no distress. Eyes: Conjunctivae are normal. PERRL. Normal extraocular movements. ENT   Head: Normocephalic and atraumatic.   Nose: No congestion/rhinnorhea.   Mouth/Throat: Mucous membranes are moist.   Neck: No stridor. Cardiovascular: Normal rate, regular rhythm.  No murmurs, rubs, or gallops. Respiratory: Normal respiratory effort without tachypnea nor retractions. Breath sounds are clear and equal bilaterally. No wheezes/rales/rhonchi. Gastrointestinal: Soft and nontender. No distention.  Genitourinary: Deferred Musculoskeletal: Normal range of motion in all extremities. No joint effusions.  Neurologic:  Normal speech and language. No gross focal neurologic deficits are appreciated.  Skin:  Skin is warm, dry and intact. No rash noted. Psychiatric: Endorses hallucinations. Appears to be responding to internal visual stimuli. Does endorse thoughts wanting to harm himself and others.  ____________________________________________    LABS (pertinent positives/negatives)  Labs Reviewed   COMPREHENSIVE METABOLIC PANEL - Abnormal; Notable for the following:    Chloride 99 (*)    Glucose, Bld 148 (*)    Total Protein 8.2 (*)    All other components within normal limits  ACETAMINOPHEN LEVEL - Abnormal; Notable for the following:    Acetaminophen (Tylenol), Serum <10 (*)    All other components within normal limits  URINE DRUG SCREEN, QUALITATIVE ([facility] ONLY) - Abnormal; Notable for the following:    Cannabinoid 50 Ng, Ur Isabel POSITIVE (*)    All other components within normal limits  ETHANOL  SALICYLATE LEVEL  CBC     ____________________________________________   EKG  None  ____________________________________________    RADIOLOGY  None  ____________________________________________   PROCEDURES  Procedure(s) performed: None  Critical Care performed: No  ____________________________________________   INITIAL IMPRESSION / ASSESSMENT AND PLAN / ED COURSE  Pertinent labs & imaging results that were available during my care of the patient were reviewed by me and considered in my medical decision making (see chart for details).  Age presents to the emergency department today with concerns for hallucinations. He does endorse thoughts of self-harm and harming others. Patient does have a psychiatric history. He is unfortunately not a great historian at this time. Will place patient under IVC given concerns for his own safety as well as the safety of those around him. Will have psychiatry evaluate.  ____________________________________________   FINAL CLINICAL IMPRESSION(S) / ED DIAGNOSES  Psychosis  Note: This dictation was prepared with Dragon dictation. Any transcriptional errors that result from this process are unintentional    Phineas SemenGraydon Ramanda Paules, MD 09/01/15 2344

## 2015-09-01 NOTE — ED Notes (Signed)
[facility]; pt's belongings placed in bag (1/1) and labeled.

## 2015-09-01 NOTE — ED Notes (Signed)
Pt states "i'm hearing voices". Pt denies si or hi but states "i feel like others are going to hurt me." pt states has had difficulty sleeping and has been under stress. Voice very soft in triage, pt with minimal eye contact in triage.

## 2015-09-01 NOTE — ED Notes (Signed)
Gave pt Mal[place]key sandwich snack tray and drink. AS

## 2015-09-02 ENCOUNTER — Encounter: Payer: Self-pay | Admitting: Emergency Medicine

## 2015-09-02 DIAGNOSIS — F19951 Other psychoactive substance use, unspecified with psychoactive substance-induced psychotic disorder with hallucinations: Secondary | ICD-10-CM

## 2015-09-02 MED ORDER — HYDROXYZINE HCL 25 MG PO TABS
50.0000 mg | ORAL_TABLET | Freq: Once | ORAL | Status: AC
  Administered 2015-09-02: 50 mg via ORAL
  Filled 2015-09-02: qty 2

## 2015-09-02 MED ORDER — LORAZEPAM 1 MG PO TABS
1.0000 mg | ORAL_TABLET | Freq: Once | ORAL | Status: AC
  Administered 2015-09-02: 1 mg via ORAL

## 2015-09-02 MED ORDER — LORAZEPAM 1 MG PO TABS
ORAL_TABLET | ORAL | Status: AC
  Administered 2015-09-02: 1 mg via ORAL
  Filled 2015-09-02: qty 1

## 2015-09-02 MED ORDER — LORAZEPAM 1 MG PO TABS
ORAL_TABLET | ORAL | Status: AC
  Filled 2015-09-02: qty 1

## 2015-09-02 MED ORDER — RISPERIDONE 1 MG PO TABS
1.0000 mg | ORAL_TABLET | Freq: Two times a day (BID) | ORAL | Status: DC
  Administered 2015-09-02 – 2015-09-03 (×3): 1 mg via ORAL
  Filled 2015-09-02 (×4): qty 1

## 2015-09-02 NOTE — ED Notes (Signed)
Patient continues to present with confusion, psychosis, bizarre behaviors. Very restless. Unable to eat lunch at present. Will continue to monitor, offer fluids and food as tolerated. Maintained on 15 minute checks and observation by security camera for safety.

## 2015-09-02 NOTE — ED Notes (Addendum)
BEHAVIORAL HEALTH ROUNDING  Patient sleeping: No.  Patient alert and oriented: alert but disoriented, restless at times pacing Behavior appropriate: Yes. ; If no, describe:  Nutrition and fluids offered: Yes  Toileting and hygiene offered: Yes  Sitter present: not applicable, Q 15 min safety rounds and observation.  Law enforcement present: Yes ODS  

## 2015-09-02 NOTE — BH Specialist Note (Signed)
TTS attempted to conduct assessment.  TTS was unable to complete the assessment as the patient was not coherent at the time.  He was unable to answer basic questions. He was unable to identify his name or birth date.  He spoke nonsensically  and was unable to convey his thoughts to the TTS.

## 2015-09-02 NOTE — ED Notes (Signed)
BEHAVIORAL HEALTH ROUNDING  Patient sleeping: No.  Patient alert and oriented: alert but disoriented, restless at times pacing Behavior appropriate: Yes. ; If no, describe:  Nutrition and fluids offered: Yes  Toileting and hygiene offered: Yes  Sitter present: not applicable, Q 15 min safety rounds and observation.  Law enforcement present: Yes ODS  

## 2015-09-02 NOTE — ED Notes (Addendum)
BEHAVIORAL HEALTH ROUNDING  Patient sleeping: No.  Patient alert and oriented: alert but disoriented, restless at times pacing Behavior appropriate: Yes. ; If no, describe:  Nutrition and fluids offered: Yes  Toileting and hygiene offered: Yes  Sitter present: not applicable, Q 15 min safety rounds and observation.  Law enforcement present: Yes ODS

## 2015-09-02 NOTE — ED Provider Notes (Signed)
-----------------------------------------   6:27 AM on 09/02/2015 -----------------------------------------   Blood pressure 150/103, pulse 120, temperature 99 F (37.2 C), temperature source Oral, resp. rate 18, height 5\' 4"  (1.626 m), weight 115 lb (52.164 kg), SpO2 100 %.  Approximately 45 minutes ago patient attempted to run off the units. He was restrained by OD is off this are. He is currently mildly agitated and pacing the floor. Will administer PO Ativan for gentle calming medicine.  Disposition is pending per Psychiatry/Behavioral Medicine team recommendations.     Irean HongJade J Maleea Camilo, MD 09/02/15 [phone]

## 2015-09-02 NOTE — ED Notes (Signed)
[facility] He is grossly psychotic and unable to answer assessment questions. He does respond to firm redirection. Patient is restless, pacing the unit, and picking at imaginary objects. He appears to be responding to internal stimuli.  Maintained on 15 minute checks and observation by security camera for safety.

## 2015-09-02 NOTE — BH Assessment (Addendum)
Assessment Note  Manuel Richards is an 25 y.o. male. Who presented to the ER voluntarily with bizarre behavior. Pt responded to voice prompts and seemed agreeable to complete assessment. Pt was difficult to understand much of the time. Pt unable to provide clear history. Pt presenting with impaired insight, judgement and impulse control, further evaluation is recommended.  Writer unable to conduct a thorough assessment, Information obtained from pt record and observation. Pt has a hx of Schizophreniform, ADHD, Anxiety and polysubstance. Upon presenting to the ER the pts girlfriend (mother of his child) reports pt has not been eating or sleeping for about 4 days. She reports similar episode when he was admitted to inpatient in Nov of 2016 for a week and put on medications. Pt did not follow up with [facility] or continue to take his medications after discharge per girlfriend even though she has encouraged him to. Girlfriend reports pt thinks if he admits he has a mental health issue he will lose his child. Pt unable to answer questions about many things but does deny SI/HI when asked and does report AV/HV when asked. While in the ER pt was grossly psychotic and unable to answer assessment questions. He does respond to firm redirection. Patient is restless, pacing the unit, and picking at imaginary objects. He appears to be responding to internal stimuliWriter is concerned for Pts safety and the safety of others.  Pt requires inpatient psychiatric hospitalization for safety common stabilization, and medication management.   Diagnosis: Schizophreniform   Past Medical History:  Past Medical History  Diagnosis Date  . Anxiety   . ADHD (attention deficit hyperactivity disorder)     History reviewed. No pertinent past surgical history.  Family History:  Family History  Problem Relation Age of Onset  . Bipolar disorder Mother   . Schizophrenia Maternal Grandmother   . Schizophrenia Maternal Uncle     Social  History:  reports that he has been smoking.  He has never used smokeless tobacco. He reports that he drinks alcohol. He reports that he uses illicit drugs (Cocaine and Marijuana).  Additional Social History:  Alcohol / Drug Use Pain Medications: Unable to Assess Prescriptions: Unable to Assess Over the Counter: Unable to Assess History of alcohol / drug use?: Yes (UTA/ UDS (+) THC  ) Longest period of sobriety (when/how long): Unable to Assess  CIWA: CIWA-Ar BP: (!) 142/100 mmHg Pulse Rate: 99 COWS:    Allergies:  Allergies  Allergen Reactions  . Penicillins Other (See Comments)    Reaction:  Unknown; childhood reaction     Home Medications:  (Not in a hospital admission)  OB/GYN Status:  No LMP for male patient.  General Assessment Data Location of Assessment: [facility] ED TTS Assessment: In system Is this a Tele or Face-to-Face Assessment?: Face-to-Face Is this an Initial Assessment or a Re-assessment for this encounter?: Initial Assessment Marital status: Other (comment) (UTA) Living Arrangements: Other (Comment) (UTA) Can pt return to current living arrangement?:  (UTA) Admission Status: Involuntary Is patient capable of signing voluntary admission?: No Referral Source: Self/Family/Friend Insurance type: None   Medical Screening Exam [facility] Walk-in ONLY) Medical Exam completed: Yes  Crisis Care Plan Living Arrangements: Other (Comment) (UTA) Legal Guardian: Other: (UTA) Name of Psychiatrist: UTA Name of Therapist: UTA  Education Status Is patient currently in school?: No Current Grade: UTA Highest grade of school patient has completed: UTA Name of school: UTA Contact person: UTA  Risk to self with the past 6 months Suicidal Ideation:  (  UTA) Has patient been a risk to self within the past 6 months prior to admission? : Other (comment) (UTA) Suicidal Intent:  (UTA) Has patient had any suicidal intent within the past 6 months prior to admission? : Other (comment)  (UTA) Is patient at risk for suicide?:  (UTA) Suicidal Plan?:  (UTA) Has patient had any suicidal plan within the past 6 months prior to admission? : Other (comment) (UTA) Access to Means:  (UTA) What has been your use of drugs/alcohol within the last 12 months?: UTA Previous Attempts/Gestures:  (UTA) How many times?:  (UTA) Other Self Harm Risks:  (UTA) Triggers for Past Attempts: Other (Comment) (UTA) Intentional Self Injurious Behavior: None Family Suicide History: Unable to assess Recent stressful life event(s): Other (Comment) (UTA) Persecutory voices/beliefs?:  (UTA) Depression:  (UTA) Depression Symptoms:  (UTA) Substance abuse history and/or treatment for substance abuse?: Yes Suicide prevention information given to non-admitted patients: Not applicable  Risk to Others within the past 6 months Homicidal Ideation:  (UTA) Does patient have any lifetime risk of violence toward others beyond the six months prior to admission? : Unknown (UTA) Thoughts of Harm to Others:  (UTA) Current Homicidal Intent:  (UTA) Current Homicidal Plan:  (UTA) Access to Homicidal Means:  (UTA) Identified Victim: UTA History of harm to others?:  (UTA) Assessment of Violence:  (UTA) Violent Behavior Description:  (UTA) Does patient have access to weapons?:  (UTA) Criminal Charges Pending?:  (UTA) Does patient have a court date:  (UTA) Is patient on probation?: Unknown  Psychosis Hallucinations: Auditory, Visual (Noted ) Delusions: Unspecified (Noted )  Mental Status Report Appearance/Hygiene: Bizarre Eye Contact: Poor Motor Activity: Restlessness, Agitation Speech: Incoherent Level of Consciousness: Restless Mood: Suspicious, Labile Affect: Unable to Assess Anxiety Level:  (UTA ) Thought Processes: Unable to Assess Judgement: Unable to Assess Orientation: Person Obsessive Compulsive Thoughts/Behaviors: Unable to Assess  Cognitive Functioning Concentration: Unable to Assess Memory:  Remote Impaired, Recent Impaired IQ: Average Insight: Unable to Assess Impulse Control: Unable to Assess Appetite: Poor (Per Report ) Weight Loss:  (UTA ) Weight Gain:  (UTA ) Sleep: Unable to Assess Total Hours of Sleep:  (UTA ) Vegetative Symptoms: Unable to Assess  ADLScreening [facility] Services) Patient's cognitive ability adequate to safely complete daily activities?: Yes Patient able to express need for assistance with ADLs?: Yes Independently performs ADLs?: Yes (appropriate for developmental age)  Prior Inpatient Therapy Prior Inpatient Therapy: Yes Prior Therapy Dates: 12/2014 Prior Therapy Facilty/Provider(s): [facility] Reason for Treatment: Bizarre behavior   Prior Outpatient Therapy Prior Outpatient Therapy:  (UTA) Prior Therapy Dates: UTA Prior Therapy Facilty/Provider(s): UTA Reason for Treatment: UTA Does patient have an ACCT team?: Unknown Does patient have Intensive In-House Services?  : Unknown Does patient have [facility] services? : Unknown Does patient have P4CC services?: Unknown  ADL Screening (condition at time of admission) Patient's cognitive ability adequate to safely complete daily activities?: Yes Patient able to express need for assistance with ADLs?: Yes Independently performs ADLs?: Yes (appropriate for developmental age)       Abuse/Neglect Assessment (Assessment to be complete while patient is alone) Physical Abuse:  (UTA) Verbal Abuse:  (UTA) Sexual Abuse:  (UTA) Exploitation of patient/patient's resources:  (UTA) Self-Neglect:  (UTA) Values / Beliefs Cultural Requests During Hospitalization: None Spiritual Requests During Hospitalization: None Consults Spiritual Care Consult Needed: No Social Work Consult Needed: No      Additional Information 1:1 In Past 12 Months?: No CIRT Risk: No Elopement Risk: No Does patient have  medical clearance?: Yes     Disposition:  Disposition Initial Assessment Completed for this  Encounter: Yes Disposition of Patient: Inpatient treatment program Type of inpatient treatment program: Adult  On Site Evaluation by:   Reviewed with Physician:    Asa SaunasShawanna N Josselyn Harkins 09/02/2015 11:49 AM

## 2015-09-02 NOTE — ED Notes (Signed)
Patient's mother called Santa LighterJanet Hooks [phone].  Patient is unable to give consent for sharing information at this time.  No patient information given.

## 2015-09-02 NOTE — ED Notes (Addendum)
BEHAVIORAL HEALTH ROUNDING  Patient sleeping: No. alert but disoriented, restless at times pacing Patient alert and oriented:   Behavior appropriate: Yes. ; If no, describe:  Nutrition and fluids offered: Yes  Toileting and hygiene offered: Yes  Sitter present: not applicable, Q 15 min safety rounds and observation.  Law enforcement present: Yes [employer]

## 2015-09-02 NOTE — ED Notes (Signed)
Patient meeting with psychiatrist. Maintained on 15 minute checks and observation by security camera for safety.

## 2015-09-02 NOTE — ED Notes (Signed)
No change in clinical status. Still bizarre behavior, picking up "worms" off the floor, restless. Speech/thoughts are illogical and disorganized. Still checking all doors and handles. Maintained on 15 minute checks and observation by security camera for safety.

## 2015-09-02 NOTE — ED Notes (Addendum)
ENVIRONMENTAL ASSESSMENT  Potentially harmful objects out of patient reach: Yes.  Personal belongings secured: Yes.  Patient dressed in hospital provided attire only: Yes.  Plastic bags out of patient reach: Yes.  Patient care equipment (cords, cables, call bells, lines, and drains) shortened, removed, or accounted for: Yes.  Equipment and supplies removed from bottom of stretcher: Yes.  Potentially toxic materials out of patient reach: Yes.  Sharps container removed or out of patient reach: Yes.  BEHAVIORAL HEALTH ROUNDING  Patient sleeping: No.  Patient alert and oriented: alert but disoriented, restless at times pacing Behavior appropriate: Yes. ; If no, describe:  Nutrition and fluids offered: Yes  Toileting and hygiene offered: Yes  Sitter present: not applicable, Q 15 min safety rounds and observation.  Law enforcement present: Yes ODS

## 2015-09-02 NOTE — ED Notes (Signed)
Patient wandering around the unit, touching doors and walls in a bizarre way. Appears to be picking at objects. He has also tried to open the nursing unit door several times. Continues to be disoriented except for name. Speech is mostly incoherent. Unable to respond to any reality orientation. Maintained on 15 minute checks and observation by security camera for safety.

## 2015-09-02 NOTE — ED Notes (Signed)
BEHAVIORAL HEALTH ROUNDING Patient sleeping: No. Patient alert and oriented: alert and oriented to self only Behavior appropriate: No.; If no, describe: agitated, pacing, taking off shirt Nutrition and fluids offered: Yes  Toileting and hygiene offered: Yes  Sitter present: yes Law enforcement present: Yes

## 2015-09-02 NOTE — ED Notes (Signed)
Since the start of shift patient's behavior has been bizarre.  Patient is intrusive and has board boundaries.  Patient witnessed entering other rooms, removing mattresses from bed, and standing on beds.  Patient refused his medication stating, "Relax.  You don't have to do all that.  It's ok."  Patient now standing in bathroom with is shift off.  Patient can be seen talking to himself as if responding to internal stimuli.

## 2015-09-02 NOTE — Progress Notes (Signed)
[facility] by Dr. Toni Amendlapacs.  Attending Physician will be Dr. Jennet MaduroPucilowska.   [facility] Victorino DikeJennifer.   Intake Paper Work has been signed and placed on patient chart.  ER staff is aware of the admission [facility]( [facility] ER Sect.; Dr Scotty CourtStafford ER MD; Amy Patient's Nurse & Lowanda FosterBrittany Patient Access).  09/02/2015 Cheryl FlashNicole Yuvan Medinger, MS, NCC, LPCA Therapeutic Triage Specialist

## 2015-09-02 NOTE — ED Notes (Signed)
Patient confused, disoriented, psychotic. Needing constant redirection from staff. Maintained on 15 minute checks and observation by security camera for safety.

## 2015-09-02 NOTE — ED Notes (Signed)
Girlfriend (mother of his child) reports pt has not been eating or sleeping for about 4 days. She reports similar episode when he was admitted to inpatient in Nov of 2016 for a week and put on medications. Pt did not follow up with [facility] or continue to take his medications after discharge per girlfriend even though she has encouraged him to. Girlfriend reports pt thinks if he admits he has a mental health issue he will lose his child. Pt unable to answer questions about many things but does deny SI/HI when asked and does report AV/HV when asked.

## 2015-09-02 NOTE — ED Notes (Signed)

## 2015-09-02 NOTE — ED Notes (Signed)
IVC/Consult completed/[facility]

## 2015-09-02 NOTE — ED Notes (Signed)
BEHAVIORAL HEALTH ROUNDING Patient sleeping: No. Patient alert and oriented: oriented to self only Behavior appropriate: No.; If no, describe: patient miming rolling joints and smoking marijuana, talking to sink Nutrition and fluids offered: Yes  Toileting and hygiene offered: Yes  Sitter present: q 15 min checks, Engineer, materialssecurity officer at [employer] Law enforcement present: Yes

## 2015-09-02 NOTE — ED Notes (Signed)
Patient drank 8 oz soda.

## 2015-09-02 NOTE — ED Notes (Signed)
Pt bolted out of his room in an attempt to run off unit. Pt restrained by [employer] officer [place] and returned to his room. Pt did not speak during this occurrence. Pt was not injured during this. Dr Dolores FrameSung informed.

## 2015-09-02 NOTE — ED Notes (Signed)
Patient given meal tray. He ate half of a hamburger and some cookies. He continues to wander in and out of all rooms.

## 2015-09-02 NOTE — ED Notes (Signed)
Patient's mother told this RN, "he will do any drugs he can get his hands on.  He snorts pills, and all kinds of stuff.  He's kind of mad at me lately.  He's telling me that it's stress but I've never seen stress do this to people.  He did this before and they thought he had gotten hold of some bad drugs."

## 2015-09-02 NOTE — ED Notes (Signed)
Patient is pacing the room, taking his shirt on and off.  Patient is not being violent and is redirectible for a short amount of time.  Following some directions.  Patient talking to inanimate objects and seeing objects that are not there.

## 2015-09-02 NOTE — ED Notes (Addendum)
This writer unable to get oral temp on patient. Patient would not close mouth on probe. Patient was directed multiple times to close mouth. Patient stated to this writer, "You are a bitch". Patient was advised and redirected for using language.

## 2015-09-02 NOTE — ED Notes (Signed)
Patient urinated on the chair next to his bed. Stated he was "too lazy" to go to the toilet.  Maintained on 15 minute checks and observation by security camera for safety.

## 2015-09-02 NOTE — Consult Note (Signed)
[place] [facility] Consult   Reason for Consult:  Consult for 25 year old man brought in at the request of his girlfriend because of bizarre behavior Referring Physician:  Joni Fears Patient Identification: Manuel Richards MRN:  [id number] Principal Diagnosis: Substance-induced psychotic disorder with hallucinations [facility]) Diagnosis:   Patient Active Problem List   Diagnosis Date Noted  . 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 Substance-induced psychotic disorder with hallucinations ([place]) [F19.951] 12/19/2014  . Opioid use disorder, moderate, dependence ([place]) [F11.20] 12/19/2014  . Cannabis use disorder, moderate, dependence ([place]) [F12.20] 12/19/2014  . Tobacco use disorder [F17.200] 12/19/2014    Total Time spent with patient: 1 hour  Subjective:   Manuel Richards is a 25 y.o. male patient admitted with patient is not able to give any coherent history.  HPI:  Patient interviewed. Chart reviewed. Case reviewed with nursing and emergency room physician and TTS. 67 year old man brought in at the request of his girlfriend because of what is reported to of been a few days of bizarre behavior. On interview with me today the patient was unable to offer any information at all. At one point I got him to tell me his address but there was nothing else lucid he was able to communicate. He is confused and doesn't know where he is. Not able to pay attention to a conversation at all. Seems to be responding to internal stimuli and possibly having visual hallucinations. Secondhand report is that the patient had been using drugs. He is not able to give me any more details. Patient does have a past history of delusional psychotic presentation and hospitalization. Unknown when or if he has been on any medicine recently.  Social history: Apparently he lives with a girlfriend. He tells me he is not working. Don't know much else about his situation.  Medical history: Patient doesn't look like he is in  very good health but he doesn't have any known specific condition. He has a lot of skin sores on his face and arms part of which is probably acne and part of which might be picking and poor hygiene.  Substance abuse history: Has a documented past history of substance abuse including opiates and cannabis  Past Psychiatric History: He's been here in the psychiatric hospital at least once before and at that time presented with confusion and agitation that eventually cleared up and was thought to be related to drug abuse. Denies ever having tried to kill himself. No real history known of any violence. When he was discharged from the hospital last time he was not on antipsychotics.  Risk to Self: Suicidal Ideation:  (UTA) Suicidal Intent:  (UTA) Is patient at risk for suicide?:  (UTA) Suicidal Plan?:  (UTA) Access to Means:  (UTA) What has been your use of drugs/alcohol within the last 12 months?: UTA How many times?:  (UTA) Other Self Harm Risks:  (UTA) Triggers for Past Attempts: Other (Comment) (UTA) Intentional Self Injurious Behavior: None Risk to Others: Homicidal Ideation:  (UTA) Thoughts of Harm to Others:  (UTA) Current Homicidal Intent:  (UTA) Current Homicidal Plan:  (UTA) Access to Homicidal Means:  (UTA) Identified Victim: UTA History of harm to others?:  (UTA) Assessment of Violence:  (UTA) Violent Behavior Description:  (UTA) Does patient have access to weapons?:  (UTA) Criminal Charges Pending?:  (UTA) Does patient have a court date:  (UTA) Prior Inpatient Therapy: Prior Inpatient Therapy: Yes Prior Therapy Dates: 12/2014 Prior Therapy Facilty/Provider(s): [facility] Reason for Treatment: Bizarre behavior  Prior  Outpatient Therapy: Prior Outpatient Therapy:  (UTA) Prior Therapy Dates: UTA Prior Therapy Facilty/Provider(s): UTA Reason for Treatment: UTA Does patient have an ACCT team?: Unknown Does patient have Intensive In-House Services?  : Unknown Does patient have  [facility] services? : Unknown Does patient have P4CC services?: Unknown  Past Medical History:  Past Medical History  Diagnosis Date  . Anxiety   . ADHD (attention deficit hyperactivity disorder)    History reviewed. No pertinent past surgical history. Family History:  Family History  Problem Relation Age of Onset  . Bipolar disorder Mother   . Schizophrenia Maternal Grandmother   . Schizophrenia Maternal Uncle    Family Psychiatric  History: Patient is not able to give me any history despite my asking Social History:  History  Alcohol Use  . Yes     History  Drug Use  . Yes  . Special: Cocaine, Marijuana    Social History   Social History  . Marital Status: Single    Spouse Name: N/A  . Number of Children: N/A  . Years of Education: N/A   Social History Main Topics  . Smoking status: Current Every Day Smoker  . Smokeless tobacco: Never Used  . Alcohol Use: Yes  . Drug Use: Yes    Special: Cocaine, Marijuana  . Sexual Activity: Not Asked   Other Topics Concern  . None   Social History Narrative   Additional Social History:    Allergies:   Allergies  Allergen Reactions  . Penicillins Other (See Comments)    Reaction:  Unknown; childhood reaction     Labs:  Results for orders placed or performed during the hospital encounter of 09/01/15 (from the past 48 hour(s))  Comprehensive metabolic panel     Status: Abnormal   Collection Time: 09/01/15  9:20 PM  Result Value Ref Range   Sodium 137 135 - 145 mmol/L   Potassium 3.5 3.5 - 5.1 mmol/L   Chloride 99 (L) 101 - 111 mmol/L   CO2 27 22 - 32 mmol/L   Glucose, Bld 148 (H) 65 - 99 mg/dL   BUN 11 6 - 20 mg/dL   Creatinine, Ser 0.95 0.61 - 1.24 mg/dL   Calcium 9.8 8.9 - 10.3 mg/dL   Total Protein 8.2 (H) 6.5 - 8.1 g/dL   Albumin 5.0 3.5 - 5.0 g/dL   AST 21 15 - 41 U/L   ALT 39 17 - 63 U/L   Alkaline Phosphatase 80 38 - 126 U/L   Total Bilirubin 0.9 0.3 - 1.2 mg/dL   GFR calc non Af Amer >60 >60 mL/min     GFR calc Af Amer >60 >60 mL/min    Comment: (NOTE) The eGFR has been calculated using the CKD EPI equation. This calculation has not been validated in all clinical situations. eGFR's persistently <60 mL/min signify possible Chronic Kidney Disease.    Anion gap 11 5 - 15  Ethanol     Status: None   Collection Time: 09/01/15  9:20 PM  Result Value Ref Range   Alcohol, Ethyl (B) <5 <5 mg/dL    Comment:        LOWEST DETECTABLE LIMIT FOR SERUM ALCOHOL IS 5 mg/dL FOR MEDICAL PURPOSES ONLY   Salicylate level     Status: None   Collection Time: 09/01/15  9:20 PM  Result Value Ref Range   Salicylate Lvl <6.0 2.8 - 30.0 mg/dL  Acetaminophen level     Status: Abnormal   Collection Time: 09/01/15  9:20 PM  Result Value Ref Range   Acetaminophen (Tylenol), Serum <10 (L) 10 - 30 ug/mL    Comment:        THERAPEUTIC CONCENTRATIONS VARY SIGNIFICANTLY. A RANGE OF 10-30 ug/mL MAY BE AN EFFECTIVE CONCENTRATION FOR MANY PATIENTS. HOWEVER, SOME ARE BEST TREATED AT CONCENTRATIONS OUTSIDE THIS RANGE. ACETAMINOPHEN CONCENTRATIONS >150 ug/mL AT 4 HOURS AFTER INGESTION AND >50 ug/mL AT 12 HOURS AFTER INGESTION ARE OFTEN ASSOCIATED WITH TOXIC REACTIONS.   cbc     Status: None   Collection Time: 09/01/15  9:20 PM  Result Value Ref Range   WBC 9.7 3.8 - 10.6 K/uL   RBC 5.28 4.40 - 5.90 MIL/uL   Hemoglobin 16.2 13.0 - 18.0 g/dL   HCT 47.9 40.0 - 52.0 %   MCV 90.8 80.0 - 100.0 fL   MCH 30.7 26.0 - 34.0 pg   MCHC 33.9 32.0 - 36.0 g/dL   RDW 12.6 11.5 - 14.5 %   Platelets 393 150 - 440 K/uL  Urine Drug Screen, Qualitative     Status: Abnormal   Collection Time: 09/01/15  9:20 PM  Result Value Ref Range   Tricyclic, Ur Screen NONE DETECTED NONE DETECTED   Amphetamines, Ur Screen NONE DETECTED NONE DETECTED   MDMA (Ecstasy)Ur Screen NONE DETECTED NONE DETECTED   Cocaine Metabolite,Ur  NONE DETECTED NONE DETECTED   Opiate, Ur Screen NONE DETECTED NONE DETECTED   Phencyclidine (PCP) Ur S  NONE DETECTED NONE DETECTED   Cannabinoid 50 Ng, Ur  POSITIVE (A) NONE DETECTED   Barbiturates, Ur Screen NONE DETECTED NONE DETECTED   Benzodiazepine, Ur Scrn NONE DETECTED NONE DETECTED   Methadone Scn, Ur NONE DETECTED NONE DETECTED    Comment: (NOTE) 734  Tricyclics, urine               Cutoff 1000 ng/mL 200  Amphetamines, urine             Cutoff 1000 ng/mL 300  MDMA (Ecstasy), urine           Cutoff 500 ng/mL 400  Cocaine Metabolite, urine       Cutoff 300 ng/mL 500  Opiate, urine                   Cutoff 300 ng/mL 600  Phencyclidine (PCP), urine      Cutoff 25 ng/mL 700  Cannabinoid, urine              Cutoff 50 ng/mL 800  Barbiturates, urine             Cutoff 200 ng/mL 900  Benzodiazepine, urine           Cutoff 200 ng/mL 1000 Methadone, urine                Cutoff 300 ng/mL 1100 1200 The urine drug screen provides only a preliminary, unconfirmed 1300 analytical test result and should not be used for non-medical 1400 purposes. Clinical consideration and professional judgment should 1500 be applied to any positive drug screen result due to possible 1600 interfering substances. A more specific alternate chemical method 1700 must be used in order to obtain a confirmed analytical result.  1800 Gas chromato graphy / mass spectrometry (GC/MS) is the preferred 1900 confirmatory method.     Current Facility-Administered Medications  Medication Dose Route Frequency Provider Last Rate Last Dose  . risperiDONE (RISPERDAL) tablet 1 mg  1 mg Oral BID Gonzella Lex, MD  Current Outpatient Prescriptions  Medication Sig Dispense Refill  . citalopram (CELEXA) 20 MG tablet Take 1 tablet (20 mg total) by mouth daily. (Patient not taking: Reported on 09/02/2015) 30 tablet 0  . levofloxacin (LEVAQUIN) 500 MG tablet Take 1 tablet (500 mg total) by mouth daily. (Patient not taking: Reported on 09/02/2015) 10 tablet 0  . traZODone (DESYREL) 100 MG tablet Take 1 tablet (100 mg total) by  mouth at bedtime. (Patient not taking: Reported on 09/02/2015) 30 tablet 0    Musculoskeletal: Strength & Muscle Tone: within normal limits Gait & Station: normal Patient leans: N/A  Psychiatric Specialty Exam: Physical Exam  Nursing note and vitals reviewed. Constitutional: He appears well-developed and well-nourished.  HENT:  Head: Normocephalic and atraumatic.  Eyes: Conjunctivae are normal. Pupils are equal, round, and reactive to light.  Neck: Normal range of motion.  Cardiovascular: Regular rhythm and normal heart sounds.   Respiratory: Effort normal. No respiratory distress.  GI: Soft.  Musculoskeletal: Normal range of motion.  Neurological: He is alert.  Skin: Skin is warm and dry.     Psychiatric: His affect is labile and inappropriate. His speech is tangential. He is agitated and actively hallucinating. Thought content is delusional. He expresses impulsivity. He is noncommunicative. He exhibits abnormal recent memory and abnormal remote memory. He is inattentive.    Review of Systems  Unable to perform ROS: psychiatric disorder    Blood pressure 142/100, pulse 99, temperature 99 F (37.2 C), temperature source Oral, resp. rate 18, height '5\' 4"'  (1.626 m), weight 52.164 kg (115 lb), SpO2 97 %.Body mass index is 19.73 kg/(m^2).  General Appearance: Disheveled  Eye Contact:  Minimal  Speech:  Garbled, Slow and Slurred  Volume:  Decreased  Mood:  Not really able to state her mood. Looks confused.  Affect:  Constricted and Inappropriate  Thought Process:  Irrelevant  Orientation:  Negative  Thought Content:  Illogical, Delusions and Hallucinations: Auditory Visual  Suicidal Thoughts:  No  Homicidal Thoughts:  No  Memory:  Immediate;   Poor Recent;   Poor Remote;   Poor  Judgement:  Poor  Insight:  Lacking  Psychomotor Activity:  Decreased  Concentration:  Concentration: Poor  Recall:  Poor  Fund of Knowledge:  Poor  Language:  Poor  Akathisia:  No  Handed:   Right  AIMS (if indicated):     Assets:  Housing Physical Health  ADL's:  Impaired  Cognition:  Impaired,  Moderate  Sleep:        Treatment Plan Summary: Daily contact with patient to assess and evaluate symptoms and progress in treatment, Medication management and Plan 25 year old man who presents with delirium and psychosis. Continues to be confused today. There is a good chance that this is largely drug-induced although his drug screen is only positive for marijuana. Could be using other drugs. Could be other psychotic disorder. Patient is unable to take care of himself and needs hospital level treatment. I will start him on antipsychotic medicines with risperidone. When necessary medicines. Admit to the hospital on 15 minute checks. Junior IVC  Disposition: Recommend psychiatric Inpatient admission when medically cleared.  Alethia Berthold, MD 09/02/2015 12:47 PM

## 2015-09-03 NOTE — ED Notes (Signed)
Approached pt with medication and fluids.  He was staring at the wall and responding to internal stimuli.  Unable to administer oral medication and he refused fluids.  Continue to closely monitor and redirect as necessary.

## 2015-09-03 NOTE — ED Notes (Signed)
Report was received Jodelle GreenLuAnn C., RN; Pt. Verbalizes with mumbling; some cussing, "What the fuck"; denies S.I./Hi. Continue to monitor with 15 min. Monitoring.

## 2015-09-03 NOTE — ED Notes (Signed)
ED BHU PLACEMENT JUSTIFICATION Is the patient under IVC or is there intent for IVC: Yes.   Is the patient medically cleared: Yes.   Is there vacancy in the ED BHU: Yes.   Is the population mix appropriate for patient: Yes.   Is the patient awaiting placement in inpatient or outpatient setting: Yes.   Has the patient had a psychiatric consult: Yes.   Survey of unit performed for contraband, proper placement and condition of furniture, tampering with fixtures in bathroom, shower, and each patient room: Yes.   APPEARANCE/BEHAVIOR uncooperative NEURO ASSESSMENT Orientation: person Hallucinations: Yes.  Auditory Hallucinations, Visual Hallucinations and Tactile Hallucinations Speech: Garbled Gait: normal RESPIRATORY ASSESSMENT Normal expansion.  Clear to auscultation.  No rales, rhonchi, or wheezing. CARDIOVASCULAR ASSESSMENT regular rate and rhythm, S1, S2 normal, no murmur, click, rub or gallop GASTROINTESTINAL ASSESSMENT soft, nontender, BS WNL, no r/g EXTREMITIES normal strength, tone, and muscle mass PLAN OF CARE Provide calm/safe environment. Vital signs assessed twice daily. ED BHU Assessment once each 12-hour shift. Collaborate with intake RN daily or as condition indicates. Assure the ED provider has rounded once each shift. Provide and encourage hygiene. Provide redirection as needed. Assess for escalating behavior; address immediately and inform ED provider.  Assess family dynamic and appropriateness for visitation as needed: Yes.   Educate the patient/family about BHU procedures/visitation: Yes.

## 2015-09-03 NOTE — ED Notes (Signed)
Patient has not slept the entire night.  He continues to exhibit bizarre behavior and is uncooperative with staff.  Patient requires multiple attempts at redirection.  Patient is not physically aggressive, however he extended his middle finger and called staff a "bitch." Patient is still intrusive and enters multiple rooms.

## 2015-09-03 NOTE — ED Provider Notes (Signed)
Progress note  09/03/15 7:11 AM Pt has had an uneventful night.  Pt was very anxious and pacing the floor yesterday, but the ativan has helped.    Pt with normal exam and vitals and agree with nursing staff care and assessment .  Pt awaiting psych disposition.  Leona CarryLinda M Zyiere Rosemond, MD 09/03/15 [phone]

## 2015-09-03 NOTE — ED Notes (Signed)
Food/fluids offered. Refused.

## 2015-09-03 NOTE — ED Notes (Signed)
Continues to have A/V/H. Difficult to redirect.  Urinated on floor.

## 2015-09-03 NOTE — ED Notes (Signed)
Took a few sips of soda.  Encouraged rest.

## 2015-09-03 NOTE — ED Notes (Signed)
Patient fed with full assist. Fluids given.

## 2015-09-03 NOTE — ED Notes (Signed)
Patient assisted with eating(3/4 peanut butter and jelly sandwich; 1/3 cup of applesauce; 1/2 cup water).

## 2015-09-03 NOTE — ED Notes (Signed)
Remains disorganized and delusional.  Unable/unwilling to respond to verbal requests.  Fluids offered.

## 2015-09-03 NOTE — ED Notes (Signed)
2oz orange juice taken orally with straw.

## 2015-09-03 NOTE — ED Notes (Signed)
This writer attempted to obtain vitals. Patient continues to appear to be responding to internal stimuli. No vitals obtained.

## 2015-09-04 LAB — CBC WITH DIFFERENTIAL/PLATELET
BASOS PCT: 0 %
Basophils Absolute: 0.1 10*3/uL (ref 0–0.1)
EOS ABS: 0 10*3/uL (ref 0–0.7)
EOS PCT: 0 %
HCT: 43 % (ref 40.0–52.0)
Hemoglobin: 15.3 g/dL (ref 13.0–18.0)
Lymphocytes Relative: 12 %
Lymphs Abs: 1.5 10*3/uL (ref 1.0–3.6)
MCH: 31.6 pg (ref 26.0–34.0)
MCHC: 35.5 g/dL (ref 32.0–36.0)
MCV: 89.1 fL (ref 80.0–100.0)
MONO ABS: 0.7 10*3/uL (ref 0.2–1.0)
MONOS PCT: 6 %
Neutro Abs: 10.1 10*3/uL — ABNORMAL HIGH (ref 1.4–6.5)
Neutrophils Relative %: 82 %
Platelets: 309 10*3/uL (ref 150–440)
RBC: 4.83 MIL/uL (ref 4.40–5.90)
RDW: 12.7 % (ref 11.5–14.5)
WBC: 12.4 10*3/uL — ABNORMAL HIGH (ref 3.8–10.6)

## 2015-09-04 LAB — COMPREHENSIVE METABOLIC PANEL
ALBUMIN: 4.9 g/dL (ref 3.5–5.0)
ALK PHOS: 65 U/L (ref 38–126)
ALT: 25 U/L (ref 17–63)
ANION GAP: 9 (ref 5–15)
AST: 31 U/L (ref 15–41)
BILIRUBIN TOTAL: 1.5 mg/dL — AB (ref 0.3–1.2)
BUN: 21 mg/dL — AB (ref 6–20)
CALCIUM: 9.6 mg/dL (ref 8.9–10.3)
CO2: 24 mmol/L (ref 22–32)
CREATININE: 0.75 mg/dL (ref 0.61–1.24)
Chloride: 103 mmol/L (ref 101–111)
GFR calc Af Amer: >60 mL/min (ref >60)
GFR calc non Af Amer: >60 mL/min (ref >60)
Glucose, Bld: 95 mg/dL (ref 65–99)
Potassium: 3.6 mmol/L (ref 3.5–5.1)
Sodium: 136 mmol/L (ref 135–145)
TOTAL PROTEIN: 7.7 g/dL (ref 6.5–8.1)

## 2015-09-04 LAB — GLUCOSE, CAPILLARY
GLUCOSE-CAPILLARY: 185 mg/dL — AB (ref 65–99)
GLUCOSE-CAPILLARY: 234 mg/dL — AB (ref 65–99)

## 2015-09-04 LAB — CK: CK TOTAL: 795 U/L — AB (ref 49–397)

## 2015-09-04 MED ORDER — HALOPERIDOL LACTATE 5 MG/ML IJ SOLN
5.0000 mg | Freq: Once | INTRAMUSCULAR | Status: AC
  Administered 2015-09-04: 5 mg via INTRAMUSCULAR

## 2015-09-04 MED ORDER — DIPHENHYDRAMINE HCL 50 MG/ML IJ SOLN
50.0000 mg | Freq: Once | INTRAMUSCULAR | Status: AC
  Administered 2015-09-04: 50 mg via INTRAMUSCULAR

## 2015-09-04 MED ORDER — HALOPERIDOL LACTATE 5 MG/ML IJ SOLN
INTRAMUSCULAR | Status: AC
  Administered 2015-09-04: 5 mg via INTRAMUSCULAR
  Filled 2015-09-04: qty 1

## 2015-09-04 MED ORDER — OLANZAPINE 5 MG PO TBDP: 5.0000 mg | ORAL_TABLET | Freq: Every day | ORAL | Status: DC

## 2015-09-04 MED ORDER — LORAZEPAM 2 MG/ML IJ SOLN
1.0000 mg | Freq: Once | INTRAMUSCULAR | Status: AC
  Administered 2015-09-04: 1 mg via INTRAVENOUS
  Filled 2015-09-04: qty 1

## 2015-09-04 MED ORDER — LORAZEPAM 2 MG/ML IJ SOLN
INTRAMUSCULAR | Status: AC
  Administered 2015-09-04: 2 mg via INTRAMUSCULAR
  Filled 2015-09-04: qty 1

## 2015-09-04 MED ORDER — LORAZEPAM 2 MG/ML IJ SOLN
2.0000 mg | Freq: Once | INTRAMUSCULAR | Status: AC
  Administered 2015-09-04: 2 mg via INTRAMUSCULAR

## 2015-09-04 MED ORDER — DIPHENHYDRAMINE HCL 50 MG/ML IJ SOLN
INTRAMUSCULAR | Status: AC
  Administered 2015-09-04: 50 mg via INTRAMUSCULAR
  Filled 2015-09-04: qty 1

## 2015-09-04 NOTE — ED Notes (Addendum)
1L NS hung per MD Malinda.  PERRLA.  Pt had moment of lucidity, introduced himself to this RN and stated "I feel all right".  Pt then went back to having eyes closed and not responding to this RN.

## 2015-09-04 NOTE — Consult Note (Signed)
  I have attempted to reach this patient's mother by telephone several times now. All of the telephone numbers that I see listed in the chart are nonworking numbers or go to a voicemail under someone else's name. Unclear if there is another number. I will follow up with nursing.

## 2015-09-04 NOTE — BH Assessment (Signed)
Referral information for Psych Inpatient Placement have been faxed to;    [facility] [phone] or [phone]. [phone]   [facility] [phone]),    [facility] [phone] or [phone]),    [facility] [phone]),    [facility] ([phone]), declined. Do not take his age. Only adolescent and Geriatric.    [facility] [phone]),    [facility] [phone] or [phone]),    [facility] [phone]),    [facility] [phone]).

## 2015-09-04 NOTE — ED Notes (Signed)
ENVIRONMENTAL ASSESSMENT Potentially harmful objects out of patient reach: Yes Personal belongings secured: Yes Patient dressed in hospital provided attire only: Yes Plastic bags out of patient reach: Yes Patient care equipment (cords, cables, call bells, lines, and drains) shortened, removed, or accounted for: Yes Equipment and supplies removed from bottom of stretcher: Yes Potentially toxic materials out of patient reach: Yes Sharps container removed or out of patient reach: Yes  Patient currently in room resting and muttering to himself. Patient is alert, no signs of acute distress noted at this time. Maintained on 15 minute checks and observation by security camera for safety.

## 2015-09-04 NOTE — ED Notes (Signed)
Pt woke asking for something to drink.  Pt had a glass of gingerale.

## 2015-09-04 NOTE — ED Notes (Signed)
Pt not verbally responsive but will cooperate with arms and legs passively moved. Pt does not offer resistance or does not open eyes.

## 2015-09-04 NOTE — ED Provider Notes (Signed)
Patient's labs are reviewed. His white counts got a little bit. He is currently afebrile repeat temperature was done. His neck is supple. Patient intermittently responds appropriately to questions.  [name], MD 09/04/15 [phone]

## 2015-09-04 NOTE — ED Notes (Signed)
Spoke with pt's mother.

## 2015-09-04 NOTE — ED Notes (Signed)
Dr. Manson PasseyBrown has been made aware of Pt's behavior.

## 2015-09-04 NOTE — ED Provider Notes (Signed)
Nurse's report patient is getting pale and not responding to them. He is laying in bed and moving and mumbling. [name] patient's behavior is similar to what was described on his last inpatient psych visit. He will open his eyes and look at you and looks off in the distance appears to be responding to internal stimuli reaching out to grab things that aren't there and he is moving all of his arms and legs equally and well. Discussed with Dr. Mat Carnelay packs he is surprised by this patient's behavior he recommends trying some Ativan and I will do that. Patient will also get some blood work to make sure everything else was okay CBG was within normal limits in the last half hour. This is after this behavior started.  Manuel NatalPaul F Malinda, MD 09/04/15 [phone]

## 2015-09-04 NOTE — ED Notes (Addendum)
Dr.Clapacs at bedside  

## 2015-09-04 NOTE — ED Notes (Signed)
Pts mother visited pt. Update given

## 2015-09-04 NOTE — Consult Note (Signed)
  Psychiatry: Follow-up for 25 year old man with delirium and psychosis of unclear etiology. Notified this morning that his mental status had changed from what had previously been observed. Rather than pacing he now seems to be more unresponsive. Concern raised as his pulse and blood pressure continued to be high and temperature was transiently high. Chart reviewed. Patient seen in the emergency room. Case discussed with emergency room physician and nursing. On examination the patient is only slightly responsive to voice and does not vocalize. Does not open his eyes. He has a slight tremor. On examination the he has cogwheeling but not lead pipe rigidity. Pulse and blood pressure elevated. Temperature back down to normal.  Question of possible neuroleptic malignant syndrome. All neuroleptics will be discontinued. Labs ordered including creatinine kinase. Patient will be monitored closely. Case reviewed with emergency room doctor. I will try to reach his mother for an update to her.

## 2015-09-04 NOTE — Consult Note (Signed)
  Psychiatry: Labs reviewed. White count is moderately elevated and creatinine kinase is moderately elevated. Kidney function normal. Neither CK nor white count are dramatically high. 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 Continue close monitoring in the emergency room.

## 2015-09-04 NOTE — ED Notes (Addendum)
Pts HR elevated to 140's.  Pts HR stayed at that rate approx 45 sec and then decr. MD Malinda notified. 1L D5 hung per MD Darnelle CatalanMalinda

## 2015-09-04 NOTE — ED Provider Notes (Addendum)
-----------------------------------------   8:41 AM on 09/04/2015 -----------------------------------------   Blood pressure 121/81, pulse 83, temperature 99 F (37.2 C), temperature source Oral, resp. rate 18, height 5\' 4"  (1.626 m), weight 115 lb (52.164 kg), SpO2 100 %.  The patient had no acute events since last update.  Calm and cooperative at this time.  Disposition is pending per Psychiatry/Behavioral Medicine team recommendations.  Illness and to watch temperature as it is minimally elevated.   [name], MD 09/04/15 [phone]  [name], MD 09/04/15 [phone]

## 2015-09-04 NOTE — ED Notes (Addendum)
Patient is currently in bed moving around restlessly. When staff called patient's name, patient took 3 to 5 seconds to respond. He was able to say that he was not in any pain, but patient did not make eye contact nor were his eyes open for an extended period of time. Patient continued to mutter to himself incomprehensibly. Patient is not oriented to time or place. Will continue to monitor and will alert physician to patient's status and await orders.

## 2015-09-05 ENCOUNTER — Inpatient Hospital Stay
Admission: RE | Admit: 2015-09-05 | Discharge: 2015-09-09 | DRG: 885 | Disposition: A | Discharge status: Home / Self Care | Payer: No Typology Code available for payment source | Source: Intra-hospital | Attending: Psychiatry | Admitting: Psychiatry

## 2015-09-05 DIAGNOSIS — F17200 Nicotine dependence, unspecified, uncomplicated: Secondary | ICD-10-CM | POA: Diagnosis present

## 2015-09-05 DIAGNOSIS — F1220 Cannabis dependence, uncomplicated: Secondary | ICD-10-CM | POA: Diagnosis present

## 2015-09-05 DIAGNOSIS — F19951 Other psychoactive substance use, unspecified with psychoactive substance-induced psychotic disorder with hallucinations: Secondary | ICD-10-CM | POA: Diagnosis not present

## 2015-09-05 DIAGNOSIS — F312 Bipolar disorder, current episode manic severe with psychotic features: Secondary | ICD-10-CM | POA: Diagnosis not present

## 2015-09-05 DIAGNOSIS — F17201 Nicotine dependence, unspecified, in remission: Secondary | ICD-10-CM | POA: Diagnosis present

## 2015-09-05 DIAGNOSIS — G4700 Insomnia, unspecified: Secondary | ICD-10-CM | POA: Diagnosis present

## 2015-09-05 DIAGNOSIS — R45850 Homicidal ideations: Secondary | ICD-10-CM | POA: Diagnosis present

## 2015-09-05 DIAGNOSIS — Z880 Allergy status to penicillin: Secondary | ICD-10-CM

## 2015-09-05 DIAGNOSIS — R440 Auditory hallucinations: Secondary | ICD-10-CM | POA: Diagnosis present

## 2015-09-05 DIAGNOSIS — Z818 Family history of other mental and behavioral disorders: Secondary | ICD-10-CM | POA: Diagnosis not present

## 2015-09-05 DIAGNOSIS — R45851 Suicidal ideations: Secondary | ICD-10-CM | POA: Diagnosis present

## 2015-09-05 LAB — CK: Total CK: 541 U/L — ABNORMAL HIGH (ref 49–397)

## 2015-09-05 MED ORDER — ALUM & MAG HYDROXIDE-SIMETH 200-200-20 MG/5ML PO SUSP: 30.0000 mL | ORAL | Status: DC | PRN

## 2015-09-05 MED ORDER — RISPERIDONE 1 MG PO TABS
1.0000 mg | ORAL_TABLET | ORAL | Status: DC | PRN
  Administered 2015-09-05: 1 mg via ORAL
  Filled 2015-09-05: qty 1

## 2015-09-05 MED ORDER — SODIUM CHLORIDE 0.9 % IV BOLUS (SEPSIS)
1000.0000 mL | Freq: Once | INTRAVENOUS | Status: AC
  Administered 2015-09-05: 1000 mL via INTRAVENOUS

## 2015-09-05 MED ORDER — MAGNESIUM HYDROXIDE 400 MG/5ML PO SUSP: 30.0000 mL | Freq: Every day | ORAL | Status: DC | PRN

## 2015-09-05 MED ORDER — ACETAMINOPHEN 325 MG PO TABS: 650.0000 mg | ORAL_TABLET | Freq: Four times a day (QID) | ORAL | Status: DC | PRN

## 2015-09-05 NOTE — ED Provider Notes (Signed)
-----------------------------------------   12:36 AM on 09/05/2015 -----------------------------------------   Blood pressure 143/94, pulse 87, temperature 98.1 F (36.7 C), temperature source Axillary, resp. rate 16, height 5\' 4"  (1.626 m), weight 115 lb (52.164 kg), SpO2 95 %.  The patient had no acute events since last update.  Calm and cooperative at this time.  Disposition is pending per Psychiatry/Behavioral Medicine team recommendations.     Myrna Blazeravid Matthew Schaevitz, MD 09/05/15 [phone]

## 2015-09-05 NOTE — ED Notes (Signed)
Pt able to stand and walk around without assistance with even and steady gait.  Pt HR will increase upon arousal and movement but pt denies accompanying symptoms.

## 2015-09-05 NOTE — Consult Note (Signed)
[facility] Consult   Reason for Consult:  Consult for 25 year old man brought in at the request of his girlfriend because of bizarre behavior Referring Physician:  Joni Fears Patient Identification: DERIN GRANQUIST MRN:  [id number] Principal Diagnosis: Substance-induced psychotic disorder with hallucinations [facility]) Diagnosis:   Patient Active Problem List   Diagnosis Date Noted  . 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 Substance-induced psychotic disorder with hallucinations ([place]) [F19.951] 12/19/2014  . Opioid use disorder, moderate, dependence ([place]) [F11.20] 12/19/2014  . Cannabis use disorder, moderate, dependence ([place]) [F12.20] 12/19/2014  . Tobacco use disorder [F17.200] 12/19/2014    Total Time spent with patient: 20 minutes  Subjective:   ALLEX LAPOINT is a 26 y.o. male patient admitted with patient is not able to give any coherent history.  Follow-up for this 25 year old man who presented with agitation and several days then of what appeared to be confused delirium or psychosis. Yesterday he was very withdrawn flat and may have been having neuroleptic malignant syndrome. Today he is awake and alert and communicating. Affect flat. Denies hallucinations. Not behaving in an agitated way. Taking care of basic toileting and eating normally. Denies any suicidal thoughts. He is not able to give me any new history that would provide any better explanation of what has been going on.  HPI:  Patient interviewed. Chart reviewed. Case reviewed with nursing and emergency room physician and TTS. 27 year old man brought in at the request of his girlfriend because of what is reported to of been a few days of bizarre behavior. On interview with me today the patient was unable to offer any information at all. At one point I got him to tell me his address but there was nothing else lucid he was able to communicate. He is confused and doesn't know where he is. Not able to pay  attention to a conversation at all. Seems to be responding to internal stimuli and possibly having visual hallucinations. Secondhand report is that the patient had been using drugs. He is not able to give me any more details. Patient does have a past history of delusional psychotic presentation and hospitalization. Unknown when or if he has been on any medicine recently.  Social history: Apparently he lives with a girlfriend. He tells me he is not working. Don't know much else about his situation.  Medical history: Patient doesn't look like he is in very good health but he doesn't have any known specific condition. He has a lot of skin sores on his face and arms part of which is probably acne and part of which might be picking and poor hygiene.  Substance abuse history: Has a documented past history of substance abuse including opiates and cannabis  Past Psychiatric History: He's been here in the psychiatric hospital at least once before and at that time presented with confusion and agitation that eventually cleared up and was thought to be related to drug abuse. Denies ever having tried to kill himself. No real history known of any violence. When he was discharged from the hospital last time he was not on antipsychotics.  Risk to Self: Suicidal Ideation:  (UTA) Suicidal Intent:  (UTA) Is patient at risk for suicide?:  (UTA) Suicidal Plan?:  (UTA) Access to Means:  (UTA) What has been your use of drugs/alcohol within the last 12 months?: UTA How many times?:  (UTA) Other Self Harm Risks:  (UTA) Triggers for Past Attempts: Other (Comment) (UTA) Intentional Self Injurious Behavior: None Risk to Others: Homicidal  Ideation:  (UTA) Thoughts of Harm to Others:  (UTA) Current Homicidal Intent:  (UTA) Current Homicidal Plan:  (UTA) Access to Homicidal Means:  (UTA) Identified Victim: UTA History of harm to others?:  (UTA) Assessment of Violence:  (UTA) Violent Behavior Description:  (UTA) Does  patient have access to weapons?:  (UTA) Criminal Charges Pending?:  (UTA) Does patient have a court date:  (UTA) Prior Inpatient Therapy: Prior Inpatient Therapy: Yes Prior Therapy Dates: 12/2014 Prior Therapy Facilty/Provider(s): [facility] Reason for Treatment: Bizarre behavior  Prior Outpatient Therapy: Prior Outpatient Therapy:  (UTA) Prior Therapy Dates: UTA Prior Therapy Facilty/Provider(s): UTA Reason for Treatment: UTA Does patient have an ACCT team?: Unknown Does patient have Intensive In-House Services?  : Unknown Does patient have [facility] services? : Unknown Does patient have P4CC services?: Unknown  Past Medical History:  Past Medical History  Diagnosis Date  . Anxiety   . ADHD (attention deficit hyperactivity disorder)    History reviewed. No pertinent past surgical history. Family History:  Family History  Problem Relation Age of Onset  . Bipolar disorder Mother   . Schizophrenia Maternal Grandmother   . Schizophrenia Maternal Uncle    Family Psychiatric  History: Patient is not able to give me any history despite my asking Social History:  History  Alcohol Use  . Yes     History  Drug Use  . Yes  . Special: Cocaine, Marijuana    Social History   Social History  . Marital Status: Single    Spouse Name: N/A  . Number of Children: N/A  . Years of Education: N/A   Social History Main Topics  . Smoking status: Current Every Day Smoker  . Smokeless tobacco: Never Used  . Alcohol Use: Yes  . Drug Use: Yes    Special: Cocaine, Marijuana  . Sexual Activity: Not Asked   Other Topics Concern  . None   Social History Narrative   Additional Social History:    Allergies:   Allergies  Allergen Reactions  . Penicillins Other (See Comments)    Reaction:  Unknown; childhood reaction     Labs:  Results for orders placed or performed during the hospital encounter of 09/01/15 (from the past 48 hour(s))  Glucose, capillary     Status: Abnormal   Collection  Time: 09/04/15  9:10 AM  Result Value Ref Range   Glucose-Capillary 185 (H) 65 - 99 mg/dL  Comprehensive metabolic panel     Status: Abnormal   Collection Time: 09/04/15 10:31 AM  Result Value Ref Range   Sodium 136 135 - 145 mmol/L   Potassium 3.6 3.5 - 5.1 mmol/L   Chloride 103 101 - 111 mmol/L   CO2 24 22 - 32 mmol/L   Glucose, Bld 95 65 - 99 mg/dL   BUN 21 (H) 6 - 20 mg/dL   Creatinine, Ser 0.75 0.61 - 1.24 mg/dL   Calcium 9.6 8.9 - 10.3 mg/dL   Total Protein 7.7 6.5 - 8.1 g/dL   Albumin 4.9 3.5 - 5.0 g/dL   AST 31 15 - 41 U/L   ALT 25 17 - 63 U/L   Alkaline Phosphatase 65 38 - 126 U/L   Total Bilirubin 1.5 (H) 0.3 - 1.2 mg/dL   GFR calc non Af Amer >60 >60 mL/min   GFR calc Af Amer >60 >60 mL/min    Comment: (NOTE) The eGFR has been calculated using the CKD EPI equation. This calculation has not been validated in all clinical situations. eGFR's  persistently <60 mL/min signify possible Chronic Kidney Disease.    Anion gap 9 5 - 15  CBC with Differential     Status: Abnormal   Collection Time: 09/04/15 10:31 AM  Result Value Ref Range   WBC 12.4 (H) 3.8 - 10.6 K/uL   RBC 4.83 4.40 - 5.90 MIL/uL   Hemoglobin 15.3 13.0 - 18.0 g/dL   HCT 43.0 40.0 - 52.0 %   MCV 89.1 80.0 - 100.0 fL   MCH 31.6 26.0 - 34.0 pg   MCHC 35.5 32.0 - 36.0 g/dL   RDW 12.7 11.5 - 14.5 %   Platelets 309 150 - 440 K/uL   Neutrophils Relative % 82 %   Neutro Abs 10.1 (H) 1.4 - 6.5 K/uL   Lymphocytes Relative 12 %   Lymphs Abs 1.5 1.0 - 3.6 K/uL   Monocytes Relative 6 %   Monocytes Absolute 0.7 0.2 - 1.0 K/uL   Eosinophils Relative 0 %   Eosinophils Absolute 0.0 0 - 0.7 K/uL   Basophils Relative 0 %   Basophils Absolute 0.1 0 - 0.1 K/uL  CK     Status: Abnormal   Collection Time: 09/04/15 10:31 AM  Result Value Ref Range   Total CK 795 (H) 49 - 397 U/L  Glucose, capillary     Status: Abnormal   Collection Time: 09/04/15  9:33 PM  Result Value Ref Range   Glucose-Capillary 234 (H) 65 - 99  mg/dL  CK     Status: Abnormal   Collection Time: 09/05/15  7:21 AM  Result Value Ref Range   Total CK 541 (H) 49 - 397 U/L    No current facility-administered medications for this encounter.   Current Outpatient Prescriptions  Medication Sig Dispense Refill  . citalopram (CELEXA) 20 MG tablet Take 1 tablet (20 mg total) by mouth daily. (Patient not taking: Reported on 09/02/2015) 30 tablet 0  . levofloxacin (LEVAQUIN) 500 MG tablet Take 1 tablet (500 mg total) by mouth daily. (Patient not taking: Reported on 09/02/2015) 10 tablet 0  . traZODone (DESYREL) 100 MG tablet Take 1 tablet (100 mg total) by mouth at bedtime. (Patient not taking: Reported on 09/02/2015) 30 tablet 0    Musculoskeletal: Strength & Muscle Tone: within normal limits Gait & Station: normal Patient leans: N/A  Psychiatric Specialty Exam: Physical Exam  Nursing note and vitals reviewed. Constitutional: He appears well-developed and well-nourished.  HENT:  Head: Normocephalic and atraumatic.  Eyes: Conjunctivae are normal. Pupils are equal, round, and reactive to light.  Neck: Normal range of motion.  Cardiovascular: Regular rhythm and normal heart sounds.   Respiratory: Effort normal. No respiratory distress.  GI: Soft.  Musculoskeletal: Normal range of motion.  Neurological: He is alert.  Skin: Skin is warm and dry.     Psychiatric: Judgment normal. His affect is blunt. His affect is not labile and not inappropriate. His speech is delayed. His speech is not tangential. He is slowed. He is not agitated and not actively hallucinating. Thought content is not delusional. He does not express impulsivity. He is communicative. He exhibits abnormal recent memory. He exhibits normal remote memory. He is attentive.    Review of Systems  Constitutional: Negative.   HENT: Negative.   Eyes: Negative.   Respiratory: Negative.   Cardiovascular: Negative.   Gastrointestinal: Negative.   Musculoskeletal: Negative.     Skin: Negative.   Neurological: Negative.   Psychiatric/Behavioral: Positive for memory loss and substance abuse. Negative for depression, suicidal ideas  and hallucinations. The patient is not nervous/anxious and does not have insomnia.     Blood pressure 135/83, pulse 108, temperature 98.1 F (36.7 C), temperature source Axillary, resp. rate 17, height '5\' 4"'  (1.626 m), weight 52.164 kg (115 lb), SpO2 100 %.Body mass index is 19.73 kg/(m^2).  General Appearance: Disheveled  Eye Contact:  Minimal  Speech:  Slow  Volume:  Decreased  Mood:  Euthymic  Affect:  Constricted  Thought Process:  Irrelevant  Orientation:  Full (Time, Place, and Person)  Thought Content:  Logical  Suicidal Thoughts:  No  Homicidal Thoughts:  No  Memory:  Immediate;   Fair Recent;   Fair Remote;   Fair  Judgement:  Fair  Insight:  Lacking  Psychomotor Activity:  Decreased  Concentration:  Concentration: Poor  Recall:  Poor  Fund of Knowledge:  Poor  Language:  Fair  Akathisia:  No  Handed:  Right  AIMS (if indicated):     Assets:  Housing Physical Health  ADL's:  Intact  Cognition:  Impaired,  Mild  Sleep:        Treatment Plan Summary: Daily contact with patient to assess and evaluate symptoms and progress in treatment, Medication management and Plan Patient, after several days of agitated delirium in one day of very withdrawn flat noncommunicative delirium has woken up and is looking more alert and oriented and appropriate. Affect today is flat. He is communicating but speaking only a very little. Has no memory of the last few days. Not aggressive at this point. Denies any suicidal thoughts. Denies any hallucinations. Patient appears to be coming out of his delirium. He is now able to stand up and ambulate and take care of basic ADLs. I'm going to recommend we go ahead and admit him to the psychiatry ward just to make sure we have stabilized things and to be sure that we have some idea as to what is  going on. I would not at all feel comfortable giving him a prescription for antipsychotic at this point given the experience he had yesterday. Let treatment team downstairs reevaluate. Patient understands the plan.  Disposition: Recommend psychiatric Inpatient admission when medically cleared.  Alethia Berthold, MD 09/05/2015 1:30 PM

## 2015-09-05 NOTE — ED Notes (Signed)
Pt requesting something to drink. Pt prefers coke, pt sitting up in bed drinking at this time. Will continue oral hydration. Pt alert and oriented to place, self and situation at this time. Pt disoriented to time. Pt does not recall events preceding or when he came to ER, cannot recall how many days he has been here.

## 2015-09-05 NOTE — ED Notes (Signed)
Meal tray given to pt. Pt verbalized urge to use bathroom. Urinal given to pt. Pt stood up along side of bed to urinate in urinal with no problems.

## 2015-09-05 NOTE — BH Assessment (Signed)
[facility] by Dr. Toni Amendlapacs.  Attending Physician will be Dr. Jennet MaduroPucilowska.   Patient has been [facility] to room [facility]B, [facility] Nurse WeltyPhyllis.   Intake Paper Work has been signed and placed on patient chart.  ER staff is aware of the admission (Dr. Huel CoteQuigley, ER MD; Jeannett SeniorStephen, Patient's Nurse & Nedra HaiLee, Patient Access).

## 2015-09-05 NOTE — ED Notes (Signed)
Pt felt urge to go to bathroom. Called Mardelle Mattendy to assist pt to bathroom to make sure pt was steady walking. Pt went to bathroom walking just fine

## 2015-09-06 DIAGNOSIS — F312 Bipolar disorder, current episode manic severe with psychotic features: Principal | ICD-10-CM

## 2015-09-06 LAB — TSH: TSH: 0.405 u[IU]/mL (ref 0.350–4.500)

## 2015-09-06 LAB — LIPID PANEL
CHOL/HDL RATIO: 4.5 ratio
Cholesterol: 178 mg/dL (ref 0–200)
HDL: 40 mg/dL — ABNORMAL LOW (ref >40)
LDL Cholesterol: 127 mg/dL — ABNORMAL HIGH (ref 0–99)
Triglycerides: 55 mg/dL (ref <150)
VLDL: 11 mg/dL (ref 0–40)

## 2015-09-06 LAB — HEMOGLOBIN A1C: Hgb A1c MFr Bld: 6.1 % — ABNORMAL HIGH (ref 4.0–6.0)

## 2015-09-06 MED ORDER — TRAZODONE HCL 100 MG PO TABS
100.0000 mg | ORAL_TABLET | Freq: Every day | ORAL | Status: DC
  Administered 2015-09-06 – 2015-09-08 (×3): 100 mg via ORAL
  Filled 2015-09-06 (×3): qty 1

## 2015-09-06 MED ORDER — RISPERIDONE 1 MG PO TABS
1.0000 mg | ORAL_TABLET | Freq: Two times a day (BID) | ORAL | Status: DC
  Administered 2015-09-06 – 2015-09-09 (×7): 1 mg via ORAL
  Filled 2015-09-06 (×7): qty 1

## 2015-09-06 MED ORDER — OXCARBAZEPINE 150 MG PO TABS
300.0000 mg | ORAL_TABLET | Freq: Two times a day (BID) | ORAL | Status: DC
  Administered 2015-09-06 – 2015-09-09 (×7): 300 mg via ORAL
  Filled 2015-09-06 (×7): qty 2

## 2015-09-06 NOTE — [facility] Suicide Risk Assessment (Signed)
[facility] INPATIENT:  Family/Significant Other Suicide Prevention Education  Suicide Prevention Education:  Education Completed: Manuel HatchetRebecca Richards (signifcant other) [phone]  has been identified by the patient as the family member/significant other with whom the patient will be residing, and identified as the person(s) who will aid the patient in the event of a mental health crisis (suicidal ideations/suicide attempt).  With written consent from the patient, the family member/significant other has been provided the following suicide prevention education, prior to the and/or following the discharge of the patient.  The suicide prevention education provided includes the following:  Suicide risk factors  Suicide prevention and interventions  National Suicide Hotline telephone number  [facility] assessment telephone number  [facility] Emergency Assistance 911  [facility] and/or Residential Mobile Crisis Unit telephone number  Request made of family/significant other to:  Remove weapons (e.g., guns, rifles, knives), all items previously/currently identified as safety concern.    Remove drugs/medications (over-the-counter, prescriptions, illicit drugs), all items previously/currently identified as a safety concern.  The family member/significant other verbalizes understanding of the suicide prevention education information provided.  The family member/significant other agrees to remove the items of safety concern listed above.  Sempra EnergyCandace L Lance Richards  MSW, LCSWA   09/06/2015, 4:28 PM

## 2015-09-06 NOTE — Progress Notes (Signed)
Admission Note:  [age] yr male who presents IVC, with some confusion to place, time and situation for the treatment of substance induced psychosis and catatonia.  Patient appears flat and depressed, patient's thoughts are disorganized and incoherent with flight of ideas, and speech is tangential. Patient appears to have some memory loss; he could not remember vital details about himself, in addition he was noted to have delayed response to questions he was asked. Pt was restless during the admission process. Patient was unable to participate in admission process. Writer was unable to assess if patient was was experiencing SI/HI/AVH however he was asked to contract for safety upon admission. Pt was not hallucinating or delusional. Pt has Past medical Hx of substance induced psychosis, cannabis abuse, opoid abuse.   Skin was assessed and found to be clear of any abnormal marks, searched and no contraband found, POC and unit policies explained. Consents obtained. Food and fluids offered, and fluids accepted. Pt had no additional questions or concerns. 15 minutes checks maintained will continue to closely monitor.

## 2015-09-06 NOTE — [facility] Counselor (Signed)
Adult Comprehensive Assessment  Patient ID: Manuel Richards, male DOB: 12/03/1990, 25 y.o. MRN: [id number]  Information Source: Information source: Patient  Current Stressors:  Educational / Learning stressors: Did not finish high school  Employment / Job issues: Unemployed.  Family Relationships: None reported  Financial / Lack of resources (include bankruptcy): No income.  Housing / Lack of housing: Pt lives with mother.  Physical health (include injuries & life threatening diseases): None reported  Social relationships: None reported  Substance abuse: Pt abuses opiates and marijuana.  Bereavement / Loss: None reported.   Living/Environment/Situation:  Living Arrangements: Significant others Living conditions (as described by patient or guardian): Pt lives with mother.  How long has patient lived in current situation?: "few years"  What is atmosphere in current home: Supportive, Comfortable  Family History:  Marital status: Long term relationship Long term relationship, how long?: 3-4 years What types of issues is patient dealing with in the relationship?: "nothing."  Does patient have children?: Yes How many children?: 1 How is patient's relationship with their children?: Son that will be born in Nov.   Childhood History:  By whom was/is the patient raised?: Mother Additional childhood history information: Father is in and out of jail. No relationship with him.  Description of patient's relationship with caregiver when they were a child: Close relationship with mother.  Patient's description of current relationship with people who raised him/her: Close relationship with mother.  Does patient have siblings?: Yes Number of Siblings: 2 Description of patient's current relationship with siblings: 2 sisters; close relationship.  Did patient suffer any verbal/emotional/physical/sexual abuse as a child?: No Did patient suffer from severe childhood neglect?:  No Has patient ever been sexually abused/assaulted/raped as an adolescent or adult?: No Was the patient ever a victim of a crime or a disaster?: No Witnessed domestic violence?: No Has patient been effected by domestic violence as an adult?: No  Education:  Highest grade of school patient has completed: 11 Currently a Consulting civil engineerstudent?: No Learning disability?: No  Employment/Work Situation:  Employment situation: Unemployed Patient's job has been impacted by current illness: No What is the longest time patient has a held a job?: 7 months Where was the patient employed at that time?: [employer]  Has patient ever been in the [employer] and Companymilitary?: No  Financial Resources:  Surveyor, quantityinancial resources: Support from parents / caregiver, No income Does patient have a Lawyerrepresentative payee or guardian?: No  Alcohol/Substance Abuse:  What has been your use of drugs/alcohol within the last 12 months?: Pt reports snorting "a few pain pills" for the last week.  Alcohol/Substance Abuse Treatment Hx: Denies past history Has alcohol/substance abuse ever caused legal problems?: No  Social Support System:  Patient's Community Support System: Fair Development worker, communityDescribe Community Support System: Mother, girlfriend  Type of faith/religion: Christianity  How does patient's faith help to cope with current illness?: Comfort   Leisure/Recreation:  Leisure and Hobbies: reading, writing   Strengths/Needs:  What things does the patient do well?: Reading, writing.  In what areas does patient struggle / problems for patient: "Nothing, I guess."   Discharge Plan:  Does patient have access to transportation?: Yes Will patient be returning to same living situation after discharge?: Yes Currently receiving community mental health services: No If no, would patient like referral for services when discharged?: Yes (What county[place] Does patient have financial barriers related to discharge medications?: Yes Patient  description of barriers related to discharge medications: No income, no insurance   Summary/Recommendations:   Patient  is a 25 year old male admitted  with a diagnosis of Bipolar I disorder. Patient presented to the hospital with mania. Patient reports primary triggers for admission were insomnia, hallucinations, and paranoia. Patient will benefit from crisis stabilization, medication evaluation, group therapy and psycho education in addition to case management for discharge. At discharge, it is recommended that patient remain compliant with established discharge plan and continued treatment.    Manuel Richards MSW, [address]

## 2015-09-06 NOTE — H&P (Addendum)
Psychiatric Admission Assessment Adult  Patient Identification: Manuel Richards MRN:  [id number] Date of Evaluation:  09/06/2015 Chief Complaint:  Psychosis Principal Diagnosis: Bipolar I disorder, most recent episode manic, severe with psychotic features [facility]) Diagnosis:   Patient Active Problem List   Diagnosis Date Noted  . Bipolar I disorder, most recent episode manic, severe with psychotic features (HCC) [F31.2] 09/05/2015  . 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 Opioid use disorder, moderate, dependence (HCC) [F11.20] 12/19/2014  . Cannabis use disorder, moderate, dependence (HCC) [F12.20] 12/19/2014  . Tobacco use disorder [F17.200] 12/19/2014   History of Present Illness:   Identifying data. Manuel Richards is a 25 year old male with history of psychosis and mood instability.  Chief complaint. "I am better now."  History of present illness. Information was obtained from the patient and the chart. The patient was brought to the hospital after an episode of insomnia, paranoia, auditory hallucinations and bizarre behavior. He was disorganized, confused, almost catatonic in the emergency room and has not been able to participate in psychiatry for evaluation for several days. He was voicing suicidal and homicidal threats. Slowly, his symptoms improved. Today he seems better. He notes that he is in the hospital but is completely unaware of the situation that lead to his admission. He was hospitalized at [facility] and a half ago with a similar presentation. It was then understood that his psychosis were substance-induced. Today he adamantly denies any alcohol or illicit substance or prescription pill abuse but he is positive for marijuana. In the past he admitted to using mushrooms but he denies it now. Following discharge from the hospital he did not follow-up with his psychiatrist  nor did he take any medications. The patient reports frequent symptoms of depression with poor sleep, decreased appetite, anhedonia, feeling of guilt and hopelessness worthlessness, poor energy and constant patient, social isolation but no suicidal thinking. He experiences frequent mood swings with periods of insomnia, talkativety, and hyperactivity. He experiences frequent paranoia and auditory hallucinations in the form of chatter but is unable to make it out. He has infrequent panic attacks and social anxiety. He has some symptoms suggestive of OCD.  Past psychiatric history. There was one prior hospitalization for psychotic break. He was evaluated for substance abuse treatment at some point but did not participate. He never attempted suicide.  Family psychiatric history. Nonreported.  Social history. He lives with his girlfriend and his 60-month-old baby. He has supportive mother. He has been unemployed.  Total Time spent with patient: 1 hour  Is the patient at risk to self? Yes.    Has the patient been a risk to self in the past 6 months? No.  Has the patient been a risk to self within the distant past? No.  Is the patient a risk to others? Yes.    Has the patient been a risk to others in the past 6 months? No.  Has the patient been a risk to others within the distant past? No.   Prior Inpatient Therapy:   Prior Outpatient Therapy:    Alcohol Screening: Patient refused Alcohol Screening Tool:  (unable to assess. ) Brief Intervention:  (unable to assess. ) Substance Abuse History in the last 12 months:  Yes.   Consequences of Substance Abuse: Negative Previous Psychotropic Medications: No  Psychological Evaluations: No  Past Medical History:  Past Medical History  Diagnosis Date  . Anxiety   . ADHD (attention  deficit hyperactivity disorder)    History reviewed. No pertinent past surgical history. Family History:  Family History  Problem Relation Age of Onset  . Bipolar disorder  Mother   . Schizophrenia Maternal Grandmother   . Schizophrenia Maternal Uncle    Tobacco Screening: ([phone])::1)@ Social History:  History  Alcohol Use  . Yes     History  Drug Use  . Yes  . Special: Cocaine, Marijuana    Additional Social History:                           Allergies:   Allergies  Allergen Reactions  . Penicillins Other (See Comments)    Reaction:  Unknown; childhood reaction    Lab Results:  Results for orders placed or performed during the hospital encounter of 09/05/15 (from the past 48 hour(s))  Lipid panel     Status: Abnormal   Collection Time: 09/06/15  7:23 AM  Result Value Ref Range   Cholesterol 178 0 - 200 mg/dL   Triglycerides 55 <161 mg/dL   HDL 40 (L) >09 mg/dL   Total CHOL/HDL Ratio 4.5 RATIO   VLDL 11 0 - 40 mg/dL   LDL Cholesterol 604 (H) 0 - 99 mg/dL    Comment:        Total Cholesterol/HDL:CHD Risk Coronary Heart Disease Risk Table                     Men   Women  1/2 Average Risk   3.4   3.3  Average Risk       5.0   4.4  2 X Average Risk   9.6   7.1  3 X Average Risk  23.4   11.0        Use the calculated Patient Ratio above and the CHD Risk Table to determine the patient's CHD Risk.        ATP III CLASSIFICATION (LDL):  <100     mg/dL   Optimal  540-981  mg/dL   Near or Above                    Optimal  130-159  mg/dL   Borderline  191-478  mg/dL   High  >295     mg/dL   Very High   TSH     Status: None   Collection Time: 09/06/15  7:23 AM  Result Value Ref Range   TSH 0.405 0.350 - 4.500 uIU/mL    Blood Alcohol level:  Lab Results  Component Value Date   ETH <5 09/01/2015   ETH <5 12/18/2014    Metabolic Disorder Labs:  Lab Results  Component Value Date   HGBA1C 5.6 12/19/2014   No results found for: PROLACTIN Lab Results  Component Value Date   CHOL 178 09/06/2015   TRIG 55 09/06/2015   HDL 40* 09/06/2015   CHOLHDL 4.5 09/06/2015   VLDL 11 09/06/2015   LDLCALC 127*  09/06/2015   LDLCALC 129* 12/19/2014    Current Medications: Current Facility-Administered Medications  Medication Dose Route Frequency Provider Last Rate Last Dose  . acetaminophen (TYLENOL) tablet 650 mg  650 mg Oral Q6H PRN Audery Amel, MD      . alum & mag hydroxide-simeth (MAALOX/MYLANTA) 200-200-20 MG/5ML suspension 30 mL  30 mL Oral Q4H PRN Audery Amel, MD      . magnesium hydroxide (MILK OF MAGNESIA) suspension 30 mL  30 mL Oral Daily PRN Audery AmelJohn T Clapacs, MD      . risperiDONE (RISPERDAL) tablet 1 mg  1 mg Oral BID Shari ProwsJolanta B Karlo Goeden, MD   1 mg at 09/06/15 0854   PTA Medications: No prescriptions prior to admission    Musculoskeletal: Strength & Muscle Tone: within normal limits Gait & Station: normal Patient leans: N/A  Psychiatric Specialty Exam: Physical Exam  Nursing note and vitals reviewed. Constitutional: He is oriented to person, place, and time. He appears well-developed and well-nourished.  HENT:  Head: Normocephalic and atraumatic.  Eyes: Conjunctivae and EOM are normal. Pupils are equal, round, and reactive to light.  Neck: Normal range of motion. Neck supple.  Cardiovascular: Normal rate, regular rhythm and normal heart sounds.   Respiratory: Effort normal and breath sounds normal.  GI: Soft. Bowel sounds are normal.  Musculoskeletal: Normal range of motion.  Neurological: He is alert and oriented to person, place, and time.  Skin: Skin is warm and dry.    Review of Systems  Psychiatric/Behavioral: Positive for depression, suicidal ideas, hallucinations and substance abuse. The patient is nervous/anxious and has insomnia.   All other systems reviewed and are negative.   Blood pressure 118/89, pulse 91, temperature 98.3 F (36.8 C), temperature source Oral, resp. rate 18, height 5\' 4"  (1.626 m), weight 52.617 kg (116 lb), SpO2 100 %.Body mass index is 19.9 kg/(m^2).  See SRA.                                                   Sleep:  Number of Hours: 7       Treatment Plan Summary: Daily contact with patient to assess and evaluate symptoms and progress in treatment and Medication management   Manuel Richards is a 25 year old male with a history of psychosis and mood instability admitted in the manic psychotic episode.  1. Suicidal and homicidal ideation. The patient is able to contract for safety in the hospital.  2. Mood and psychosis. He was started on Risperdal for psychosis. We will add Trileptal for mood stabilization.  3. Metabolic syndrome screening. Lipid profile and TSH are normal. Hemoglobin A1c and prolactin are pending   4. Substance use. The patient adamantly denies any current substance use and declines treatment. His urine drug screen was positive for cannabis.  5. Insomnia. Trazodone is available.   6. Disposition. He will be discharged to home with his girlfriend. He will follow up with [facility].   Observation Level/Precautions:  15 minute checks  Laboratory:  CBC Chemistry Profile UDS UA  Psychotherapy:    Medications:    Consultations:    Discharge Concerns:    Estimated LOS:  Other:     I certify that inpatient services furnished can reasonably be expected to improve the patient's condition.    Kristine LineaJolanta Sally-Ann Cutbirth, MD 7/21/20171:08 PM

## 2015-09-06 NOTE — Progress Notes (Signed)
NUTRITION ASSESSMENT  Pt identified as at risk on the Malnutrition Screen Tool  INTERVENTION: 1. Reinforce importance of nutrition and encourage intake of food and beverages. 2. Supplements: recommend ordering if po intake inadequate (<75% of meals on average); variety of supplements available  NUTRITION DIAGNOSIS: Inadequate oral intake related to poor appetite as evidenced by pt report  Goal: Pt to meet >/= 90% of their estimated nutrition needs.  Monitor:  PO intake   Assessment:   25 y.o. male admitted with bipolar disorder, most recent episode manic, severe with psychotic features  Height: Ht Readings from Last 1 Encounters:  09/05/15 5\' 4"  (1.626 m)    Weight: Wt Readings from Last 1 Encounters:  09/05/15 116 lb (52.617 kg)    Weight Hx: Wt Readings from Last 10 Encounters:  09/05/15 116 lb (52.617 kg)  09/01/15 115 lb (52.164 kg)  12/19/14 102 lb (46.267 kg)  12/18/14 120 lb (54.432 kg)    BMI:  Body mass index is 19.9 kg/(m^2).   Estimated Nutritional Needs: Kcal: 25-30 kcal/kg Protein: > 1 gram protein/kg Fluid: 1 ml/kcal  Diet Order: Diet regular Room service appropriate?: Yes; Fluid consistency:: Thin Pt is also offered choice of scheduled unit snacks.  Pt is eating as desired. Recorded po intake 75-100% of meals  Lab results and medications reviewed.   Manuel Starcherate Asuna Peth MS, RD, LDN [phone] Pager  [phone] Weekend/On-Call Pager

## 2015-09-06 NOTE — Progress Notes (Signed)
DAR: Patient pleasant and cooperative with care. No negative behaviors noted. Is responding to questions. Minimal interactions with staff and peers. Med compliant. Denies SI, HI, AVH. Patient unsure as to why he was admitted. Encouragement and support offered. Pt receptive and remains safe on unit with q 15 min checks.

## 2015-09-06 NOTE — Plan of Care (Signed)
Problem: Education: Goal: Mental status will improve Outcome: Progressing Patient responding to staff questions

## 2015-09-06 NOTE — Plan of Care (Signed)
Problem: Coping: Goal: Ability to demonstrate self-control will improve Outcome: Progressing Patient appears sometimes impulse, but with redirection and encouragement pt is able to demonstrate self control.

## 2015-09-06 NOTE — Progress Notes (Signed)
Patient ID: Shari ProwsJoshua D Olgin, male   DOB: 10/07/1990, 25 y.o.   MRN: [id number]  CSW spoke with Garlon HatchetRebecca Cushman, significant other [phone]). She reports patient was not sleeping or eating for a few days prior to admission. He would make strange comments but when questioned about it, he would make a joke out of it. She reports pt's grandfather and uncle are both diagnosed with Schizophrenia. After last admission, pt did not follow up with [facility] or continued to take medications. Ms. Jeralene HuffCushman is supportive of the patient and plans to strongly encourage him to continue with outpatient.   Daisy FloroCandace L Wandalee Klang MSW, LCSWA  09/06/2015 4:34 PM

## 2015-09-06 NOTE — Care Management (Signed)
Manuel Richards was having blood drawn this morning but told the Chaplain he was fine and didn't need anything at the time. Chaplain will follow up with patient later to see how he is doing and touch basis with him.

## 2015-09-06 NOTE — Tx Team (Signed)
Initial Interdisciplinary Treatment Plan   PATIENT STRESSORS: Financial difficulties Marital or family conflict Medication change or noncompliance Substance abuse   PATIENT STRENGTHS: Active sense of humor Motivation for treatment/growth   PROBLEM LIST: Problem List/Patient Goals Date to be addressed Date deferred Reason deferred Estimated date of resolution  Pt stated ' I don't know why l am here" .  09/06/15           Suicidal Ideation  09/06/15     Depression  09/06/15            alcohol dependence  09/06/15                        DISCHARGE CRITERIA:  Improved stabilization in mood, thinking, and/or behavior Motivation to continue treatment in a less acute level of care Reduction of life-threatening or endangering symptoms to within safe limits  PRELIMINARY DISCHARGE PLAN: Attend 12-step recovery group Outpatient therapy  PATIENT/FAMIILY INVOLVEMENT: This treatment plan has been presented to and reviewed with the patient, Manuel Richards, The patient and family have been given the opportunity to ask questions and make suggestions.  Manuel Richards Manuel Richards 09/06/2015, 4:39 AM

## 2015-09-06 NOTE — [facility] Suicide Risk Assessment (Signed)
[facility] Admission Suicide Risk Assessment   Nursing information obtained from:    Demographic factors:    Current Mental Status:    Loss Factors:    Historical Factors:    Risk Reduction Factors:     Total Time spent with patient: 1 hour Principal Problem: Bipolar I disorder, most recent episode manic, severe with psychotic features [facility]) Diagnosis:   Patient Active Problem List   Diagnosis Date Noted  . Bipolar I disorder, most recent episode manic, severe with psychotic features (HCC) [F31.2] 09/05/2015  . 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 Opioid use disorder, moderate, dependence (HCC) [F11.20] 12/19/2014  . Cannabis use disorder, moderate, dependence (HCC) [F12.20] 12/19/2014  . Tobacco use disorder [F17.200] 12/19/2014   Subjective Data: Psychotic break.  Continued Clinical Symptoms:    The "Alcohol Use Disorders Identification Test", Guidelines for Use in Primary Care, Second Edition.  World Science writer [facility]). Score between 0-7:  no or low risk or alcohol related problems. Score between 8-15:  moderate risk of alcohol related problems. Score between 16-19:  high risk of alcohol related problems. Score 20 or above:  warrants further diagnostic evaluation for alcohol dependence and treatment.   CLINICAL FACTORS:   Panic Attacks Bipolar Disorder:   Mixed State Obsessive-Compulsive Disorder Currently Psychotic   Musculoskeletal: Strength & Muscle Tone: within normal limits Gait & Station: normal Patient leans: N/A  Psychiatric Specialty Exam: Physical Exam  Nursing note and vitals reviewed.   Review of Systems  Psychiatric/Behavioral: Positive for hallucinations. The patient is nervous/anxious and has insomnia.   All other systems reviewed and are negative.   Blood pressure 118/89, pulse 91, temperature 98.3 F (36.8 C), temperature source Oral, resp. rate 18,  height  (1.626 m), weight 52.617 kg (116 lb), SpO2 100 %.Body mass index is 19.9 kg/(m^2).  General Appearance: Casual  Eye Contact:  Fair  Speech:  Clear and Coherent  Volume:  Normal  Mood:  Anxious and Dysphoric  Affect:  Appropriate  Thought Process:  Disorganized  Orientation:  Other:  Person and place only.  Thought Content:  Delusions, Hallucinations: Auditory and Paranoid Ideation  Suicidal Thoughts:  Yes.  with intent/plan  Homicidal Thoughts:  Yes.  with intent/plan  Memory:  Immediate;   Fair Recent;   Fair Remote;   Fair  Judgement:  Impaired  Insight:  Lacking  Psychomotor Activity:  Increased  Concentration:  Concentration: Fair and Attention Span: Fair  Recall:  [employer] of Knowledge:  Fair  Language:  Fair  Akathisia:  No  Handed:  Right  AIMS (if indicated):     Assets:  Communication Skills Desire for Improvement Housing Intimacy Physical Health Resilience Social Support  ADL's:  Intact  Cognition:  WNL  Sleep:  Number of Hours: 7      COGNITIVE FEATURES THAT CONTRIBUTE TO RISK:  None    SUICIDE RISK:   Moderate:  Frequent suicidal ideation with limited intensity, and duration, some specificity in terms of plans, no associated intent, good self-control, limited dysphoria/symptomatology, some risk factors present, and identifiable protective factors, including available and accessible social support.  PLAN OF CARE: Hospital admission, medication management, discharge planning.  Manuel Richards is a 25 year old male with a history of psychosis and mood instability admitted in the manic psychotic episode.  1. Suicidal and homicidal ideation. The patient is able to contract for safety in the hospital.  2. Mood and psychosis. He was  started on Risperdal for psychosis. We will add Trileptal for mood stabilization.  3. Metabolic syndrome screening. Lipid profile and TSH are normal. Hemoglobin A1c and prolactin are pending   4. Substance use. The  patient adamantly denies any current substance use and declines treatment. His urine drug screen was positive for cannabis.  5. Disposition. He will be discharged to home with his girlfriend. He will follow up with [facility].  I certify that inpatient services furnished can reasonably be expected to improve the patient's condition.   Manuel LineaJolanta Pucilowska, MD 09/06/2015, 1:02 PM

## 2015-09-06 NOTE — [facility] Group Notes (Signed)
[facility] LCSW Group Therapy  09/06/2015 2:18 PM  Type of Therapy:  Group Therapy  Participation Level:  Did Not Attend but was invited to attend daily group.   Summary of Progress/Problems: Feelings around Relapse. Group members discussed the meaning of relapse and shared personal stories of relapse, how it affected them and others, and how they perceived themselves during this time. Group members were encouraged to identify triggers, warning signs and coping skills used when facing the possibility of relapse. Social supports were discussed and explored in detail.    Angelita Harnack G. Garnette CzechSampson MSW, LCSWA 09/06/2015, 2:18 PM

## 2015-09-07 DIAGNOSIS — F1220 Cannabis dependence, uncomplicated: Secondary | ICD-10-CM

## 2015-09-07 LAB — PROLACTIN: Prolactin: 31.6 ng/mL — ABNORMAL HIGH (ref 4.0–15.2)

## 2015-09-07 NOTE — Plan of Care (Signed)
Problem: Safety: Goal: Periods of time without injury will increase Outcome: Progressing Pt remains free from harm.  Problem: Education: Goal: Will be free of psychotic symptoms Outcome: Progressing Pt denies AVH at this time.

## 2015-09-07 NOTE — Progress Notes (Signed)
Pt has been pleasant and cooperative. Pt denies SI and A/V hallucinations. Pt has not attended any unit activities. Pt has been seclusive to his room. Pt's mood and affect has been depressed. Will continue to observe and maintain a safe environment.

## 2015-09-07 NOTE — Progress Notes (Signed)
[facility] MD Progress Note  09/07/2015 12:41 PM Manuel Richards  MRN:  [id number] Subjective:   Patient is a 25 year old male who was brought to the hospital due to confuse catatonic and bizarre behavior.  He has history of bipolar disorder and has been exhibiting suicidal and homicidal ideations. He reported that he is unaware of the reason he was admitted to the hospital. He stated that he has history of noncompliance with the medications. Patient reported that after his discharge from the hospital last year he did not follow-up with any psychiatrist. Patient's continued to show poor insight into his symptoms. He frequently experiences more swings with periods of insomnia, hyper verbal and stated that he does not uses any drugs or alcohol. He has poor insight into his symptoms.  His urine drug screen was positive for marijuana at this time. Patient reported that he lives with his girlfriend.   He currently denied having any suicidal ideations or plans.  Principal Problem: Bipolar I disorder, most recent episode manic, severe with psychotic features (HCC) Diagnosis:   Patient Active Problem List   Diagnosis Date Noted  . Bipolar I disorder, most recent episode manic, severe with psychotic features (HCC) [F31.2] 09/05/2015  . 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 Opioid use disorder, moderate, dependence (HCC) [F11.20] 12/19/2014  . Cannabis use disorder, moderate, dependence (HCC) [F12.20] 12/19/2014  . Tobacco use disorder [F17.200] 12/19/2014   Total Time spent with patient: 30 minutes  Past Psychiatric History: History of anxiety and bipolar disorder. He was previously admitted last year and has noncompliance with medication.  Past Medical History:  Past Medical History  Diagnosis Date  . Anxiety   . ADHD (attention deficit hyperactivity disorder)    History reviewed. No pertinent past surgical  history. Family History:  Family History  Problem Relation Age of Onset  . Bipolar disorder Mother   . Schizophrenia Maternal Grandmother   . Schizophrenia Maternal Uncle    Family Psychiatric  History: History of bipolar and schizophrenia and his family members. Social History:  History  Alcohol Use  . Yes     History  Drug Use  . Yes  . Special: Cocaine, Marijuana    Social History   Social History  . Marital Status: Single    Spouse Name: N/A  . Number of Children: N/A  . Years of Education: N/A   Social History Main Topics  . Smoking status: Current Every Day Smoker  . Smokeless tobacco: Never Used  . Alcohol Use: Yes  . Drug Use: Yes    Special: Cocaine, Marijuana  . Sexual Activity: Not Asked   Other Topics Concern  . None   Social History Narrative   Additional Social History:                         Sleep: Fair  Appetite:  Fair  Current Medications: Current Facility-Administered Medications  Medication Dose Route Frequency Provider Last Rate Last Dose  . acetaminophen (TYLENOL) tablet 650 mg  650 mg Oral Q6H PRN Audery Amel, MD      . alum & mag hydroxide-simeth (MAALOX/MYLANTA) 200-200-20 MG/5ML suspension 30 mL  30 mL Oral Q4H PRN Audery Amel, MD      . magnesium hydroxide (MILK OF MAGNESIA) suspension 30 mL  30 mL Oral Daily PRN Audery Amel, MD      . OXcarbazepine (TRILEPTAL) tablet 300 mg  300 mg Oral BID Shari Prows, MD   300 mg at 09/07/15 0842  . risperiDONE (RISPERDAL) tablet 1 mg  1 mg Oral BID Shari Prows, MD   1 mg at 09/07/15 0842  . traZODone (DESYREL) tablet 100 mg  100 mg Oral QHS Shari Prows, MD   100 mg at 09/06/15 2206    Lab Results:  Results for orders placed or performed during the hospital encounter of 09/05/15 (from the past 48 hour(s))  Hemoglobin A1c     Status: Abnormal   Collection Time: 09/06/15  7:23 AM  Result Value Ref Range   Hgb A1c MFr Bld 6.1 (H) 4.0 - 6.0 %  Lipid  panel     Status: Abnormal   Collection Time: 09/06/15  7:23 AM  Result Value Ref Range   Cholesterol 178 0 - 200 mg/dL   Triglycerides 55 <161 mg/dL   HDL 40 (L) >09 mg/dL   Total CHOL/HDL Ratio 4.5 RATIO   VLDL 11 0 - 40 mg/dL   LDL Cholesterol 604 (H) 0 - 99 mg/dL    Comment:        Total Cholesterol/HDL:CHD Risk Coronary Heart Disease Risk Table                     Men   Women  1/2 Average Risk   3.4   3.3  Average Risk       5.0   4.4  2 X Average Risk   9.6   7.1  3 X Average Risk  23.4   11.0        Use the calculated Patient Ratio above and the CHD Risk Table to determine the patient's CHD Risk.        ATP III CLASSIFICATION (LDL):  <100     mg/dL   Optimal  540-981  mg/dL   Near or Above                    Optimal  130-159  mg/dL   Borderline  191-478  mg/dL   High  >295     mg/dL   Very High   Prolactin     Status: Abnormal   Collection Time: 09/06/15  7:23 AM  Result Value Ref Range   Prolactin 31.6 (H) 4.0 - 15.2 ng/mL    Comment: (NOTE) Performed At: [facility] [address] [place], [place] [id number] Mila Homer MD QI:[id number]   TSH     Status: None   Collection Time: 09/06/15  7:23 AM  Result Value Ref Range   TSH 0.405 0.350 - 4.500 uIU/mL    Blood Alcohol level:  Lab Results  Component Value Date   ETH <5 09/01/2015   ETH <5 12/18/2014    Metabolic Disorder Labs: Lab Results  Component Value Date   HGBA1C 6.1* 09/06/2015   Lab Results  Component Value Date   PROLACTIN 31.6* 09/06/2015   Lab Results  Component Value Date   CHOL 178 09/06/2015   TRIG 55 09/06/2015   HDL 40* 09/06/2015   CHOLHDL 4.5 09/06/2015   VLDL 11 09/06/2015   LDLCALC 127* 09/06/2015   LDLCALC 129* 12/19/2014    Physical Findings: AIMS: Facial and Oral Movements Muscles of Facial Expression: None, normal Lips and Perioral Area: None, normal Jaw: None, normal Tongue: None, normal,Extremity Movements Upper (arms, wrists, hands,  fingers): None, normal Lower (legs, knees, ankles, toes): None, normal, Trunk Movements Neck, shoulders, hips:  None, normal, Overall Severity Severity of abnormal movements (highest score from questions above): None, normal Incapacitation due to abnormal movements: None, normal Patient's awareness of abnormal movements (rate only patient's report):  (unable to assess. ), Dental Status Current problems with teeth and/or dentures?: Yes (teeth appears brown discolored.Marland Kitchen ) Does patient usually wear dentures?: No  CIWA:    COWS:     Musculoskeletal: Strength & Muscle Tone: within normal limits Gait & Station: normal Patient leans: N/A  Psychiatric Specialty Exam: Physical Exam  Review of Systems  Psychiatric/Behavioral: Positive for depression. The patient has insomnia.   All other systems reviewed and are negative.   Blood pressure 122/87, pulse 89, temperature 98.4 F (36.9 C), temperature source Oral, resp. rate 18, height 5\' 4"  (1.626 m), weight 116 lb (52.617 kg), SpO2 100 %.Body mass index is 19.9 kg/(m^2).  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  Genco is a 25 year old male with a history of psychosis and mood instability admitted in the manic psychotic episode.  1. Suicidal and homicidal ideation. The patient is able to contract for safety in the hospital.  2. Mood and psychosis. He was started on Risperdal for psychosis. We will add Trileptal for mood stabilization.  3. Metabolic syndrome screening. Lipid profile and TSH are normal. Hemoglobin A1c and prolactin are pending   4. Substance use. The patient adamantly denies any current substance use and declines treatment. His urine drug screen was positive for cannabis.  5. Insomnia. Trazodone is available.   6. Disposition. He will be discharged to home with his girlfriend. He will follow up with [facility].    Brandy Hale, MD 09/07/2015, 12:41 PM

## 2015-09-07 NOTE — [facility] Group Notes (Signed)
[facility] LCSW Group Therapy  09/07/2015 3:19 PM  Type of Therapy:  Group Therapy  Participation Level:  Did Not Attend   Modes of Intervention:  Discussion, Education, Socialization and Support  Summary of Progress/Problems: Feelings around Relapse. Group members discussed the meaning of relapse and shared personal stories of relapse, how it affected them and others, and how they perceived themselves during this time. Group members were encouraged to identify triggers, warning signs and coping skills used when facing the possibility of relapse. Social supports were discussed and explored in detail.   [employer] MSW, LCSWA  09/07/2015, 3:19 PM

## 2015-09-07 NOTE — Plan of Care (Signed)
Problem: Education: Goal: Emotional status will improve Outcome: Progressing Pt affect is much brighter this evening.  Problem: Health Behavior/Discharge Planning: Goal: Compliance with prescribed medication regimen will improve Outcome: Progressing Pt taking medications as prescribed.

## 2015-09-07 NOTE — Progress Notes (Signed)
D: Pt continues to maintain minimal interaction with staff and peers. He is seen laying in bed most of the evening. Affect is flat. Pt rates anxiety and depression 0/10 at this time. Denies SI/HI/AVH. Denies pain.  A: Emotional support and encouragement provided. Medications administered with education. q15 minute safety checks maintained. R: Pt remains free from harm. Will continue to monitor.

## 2015-09-07 NOTE — Progress Notes (Signed)
Pt has been pleasant and cooperative. Pt denies SI and A/V hallucinations. Pt has not attended any unit activities. Pt has been seclusive to his room. Pt's mood and affect has been depressed. Will continue to observe and maintain a safe environment.               

## 2015-09-08 NOTE — [facility] Group Notes (Signed)
[facility] LCSW Group Therapy  09/08/2015 3:30 PM  Type of Therapy:  Group Therapy  Participation Level:  Pt did not attend group. CSW invited pt to group.   Summary of Progress/Problems: Patients defined and discussed healthy coping skills. Patients identified healthy coping skills they would like to try during hospitalization and after discharge. CSW offered insight to varying coping skills that may have been new to patients such as practicing mindfulness.   Katya Rolston G. Garnette Czech MSW, LCSWA 09/08/2015, 3:30 PM

## 2015-09-08 NOTE — Progress Notes (Signed)
D: Pt affect is much brighter this evening. He is seen in the milieu interacting with peers briefly. Denies SI/HI/AVH at this time. Pt states "I'm just trying to do whatever I can to get discharged." A: Emotional support and encouragement provided. Medications administered with education. q15 minute safety checks maintained.  R: Pt remains free from harm. Will continue to monitor.

## 2015-09-08 NOTE — [facility] Group Notes (Signed)
  [facility] Group Notes:  (Nursing/MHT/Case Management/Adjunct)  Date:  09/08/2015  Time:  11:14 PM  Type of Therapy:  Group Therapy  Participation Level:  Active  Participation Quality:  Appropriate  Affect:  Appropriate  Cognitive:  Appropriate  Insight:  Appropriate  Engagement in Group:  Engaged  Modes of Intervention:  n/a  Summary of Progress/Problems:  Manuel Richards 09/08/2015, 11:14 PM

## 2015-09-08 NOTE — Progress Notes (Signed)
[facility] MD Progress Note  09/08/2015 3:05 PM Manuel Richards  MRN:  [id number] Subjective:   Patient is a 25 year old male who was brought to the hospital due to confusion  catatonic and bizarre behavior.  He has history of bipolar disorder and has been exhibiting suicidal and homicidal ideations. He retired during the interview. He reported that he is not having auditory or visual hallucinations. He has been compliant with his medications and reported that he is attending groups and activities on a regular basis. He reported that he is catching up on his bed this time. Patient reported that he plans to go to the [facility] after he is discharged from the hospital. He reported that the medications are not making him tired and he has been feeling better on the current medications. He is not exhibiting any side effects at this time. Staff also reported that he has been compliant with his medications.  Patient reported that after his discharge from the hospital last year he did not follow-up with any psychiatrist.  He frequently experiences mood swings with periods of insomnia, hyper verbal and stated that he does not uses any drugs or alcohol.  His urine drug screen was positive for marijuana at this time. Patient reported that he lives with his girlfriend.   He currently denied having any suicidal ideations or plans.  Principal Problem: Bipolar I disorder, most recent episode manic, severe with psychotic features (HCC) Diagnosis:   Patient Active Problem List   Diagnosis Date Noted  . Bipolar I disorder, most recent episode manic, severe with psychotic features (HCC) [F31.2] 09/05/2015  . 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 Opioid use disorder, moderate, dependence (HCC) [F11.20] 12/19/2014  . Cannabis use disorder, moderate, dependence (HCC) [F12.20] 12/19/2014  . Tobacco use disorder [F17.200] 12/19/2014    Total Time spent with patient: 30 minutes  Past Psychiatric History: History of anxiety and bipolar disorder. He was previously admitted last year and has noncompliance with medication.  Past Medical History:  Past Medical History:  Diagnosis Date  . ADHD (attention deficit hyperactivity disorder)   . Anxiety    History reviewed. No pertinent surgical history. Family History:  Family History  Problem Relation Age of Onset  . Bipolar disorder Mother   . Schizophrenia Maternal Grandmother   . Schizophrenia Maternal Uncle    Family Psychiatric  History: History of bipolar and schizophrenia and his family members. Social History:  History  Alcohol Use  . Yes     History  Drug Use  . Types: Cocaine, Marijuana    Social History   Social History  . Marital status: Single    Spouse name: N/A  . Number of children: N/A  . Years of education: N/A   Social History Main Topics  . Smoking status: Current Every Day Smoker  . Smokeless tobacco: Never Used  . Alcohol use Yes  . Drug use:     Types: Cocaine, Marijuana  . Sexual activity: Not Asked   Other Topics Concern  . None   Social History Narrative  . None   Additional Social History:                         Sleep: Fair  Appetite:  Fair  Current Medications: Current Facility-Administered Medications  Medication Dose Route Frequency Provider Last Rate Last Dose  . acetaminophen (TYLENOL) tablet 650 mg  650 mg Oral Q6H  PRN Audery Amel, MD      . alum & mag hydroxide-simeth (MAALOX/MYLANTA) 200-200-20 MG/5ML suspension 30 mL  30 mL Oral Q4H PRN Audery Amel, MD      . magnesium hydroxide (MILK OF MAGNESIA) suspension 30 mL  30 mL Oral Daily PRN Audery Amel, MD      . OXcarbazepine (TRILEPTAL) tablet 300 mg  300 mg Oral BID Shari Prows, MD   300 mg at 09/08/15 0828  . risperiDONE (RISPERDAL) tablet 1 mg  1 mg Oral BID Shari Prows, MD   1 mg at 09/08/15 0829  . traZODone  (DESYREL) tablet 100 mg  100 mg Oral QHS Shari Prows, MD   100 mg at 09/07/15 2115    Lab Results:  No results found for this or any previous visit (from the past 48 hour(s)).  Blood Alcohol level:  Lab Results  Component Value Date   [facility] <5 09/01/2015   ETH <5 12/18/2014    Metabolic Disorder Labs: Lab Results  Component Value Date   HGBA1C 6.1 (H) 09/06/2015   Lab Results  Component Value Date   PROLACTIN 31.6 (H) 09/06/2015   Lab Results  Component Value Date   CHOL 178 09/06/2015   TRIG 55 09/06/2015   HDL 40 (L) 09/06/2015   CHOLHDL 4.5 09/06/2015   VLDL 11 09/06/2015   LDLCALC 127 (H) 09/06/2015   LDLCALC 129 (H) 12/19/2014    Physical Findings: AIMS: Facial and Oral Movements Muscles of Facial Expression: None, normal Lips and Perioral Area: None, normal Jaw: None, normal Tongue: None, normal,Extremity Movements Upper (arms, wrists, hands, fingers): None, normal Lower (legs, knees, ankles, toes): None, normal, Trunk Movements Neck, shoulders, hips: None, normal, Overall Severity Severity of abnormal movements (highest score from questions above): None, normal Incapacitation due to abnormal movements: None, normal Patient's awareness of abnormal movements (rate only patient's report):  (unable to assess. ), Dental Status Current problems with teeth and/or dentures?: Yes (teeth appears brown discolored.Marland Kitchen ) Does patient usually wear dentures?: No  CIWA:    COWS:     Musculoskeletal: Strength & Muscle Tone: within normal limits Gait & Station: normal Patient leans: N/A  Psychiatric Specialty Exam: Physical Exam   Review of Systems  Psychiatric/Behavioral: Positive for depression. The patient has insomnia.   All other systems reviewed and are negative.   Blood pressure 110/84, pulse 78, temperature 98.1 F (36.7 C), temperature source Oral, resp. rate 18, height 5\' 4"  (1.626 m), weight 116 lb (52.6 kg), SpO2 100 %.Body mass index is 19.91  kg/m.  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  1. Suicidal and homicidal ideation. The patient is able to contract for safety in the hospital.  2. Mood and psychosis. He was started on Risperdal for psychosis. We will add Trileptal for mood stabilization.  3. Metabolic syndrome screening. Lipid profile and TSH are normal. Hemoglobin A1c and prolactin are pending   4. Substance use. The patient adamantly denies any current substance use and declines treatment. His urine drug screen was positive for cannabis.  5. Insomnia. Trazodone is available.   6. Disposition. He will be discharged to home with his girlfriend. He will follow up with [facility].    Brandy Hale, MD 09/08/2015, 3:05 PM

## 2015-09-08 NOTE — Progress Notes (Signed)
Pt has been pleasant and cooperative. Pt denies SI and A/V hallucinations. Pt still not attending any unit activities. Pt has been seclusive to his room. Pt's mood and affect has been depressed. Will continue to observe and maintain a safe environment.

## 2015-09-09 MED ORDER — TRAZODONE HCL 100 MG PO TABS: 100.0000 mg | ORAL_TABLET | Freq: Every day | ORAL | 1 refills | Status: DC

## 2015-09-09 MED ORDER — OXCARBAZEPINE 300 MG PO TABS: 300.0000 mg | ORAL_TABLET | Freq: Two times a day (BID) | ORAL | 1 refills | Status: DC

## 2015-09-09 MED ORDER — RISPERIDONE 1 MG PO TABS: 1.0000 mg | ORAL_TABLET | Freq: Two times a day (BID) | ORAL | 1 refills | Status: DC

## 2015-09-09 NOTE — Plan of Care (Signed)
Problem: Health Behavior/Discharge Planning: Goal: Compliance with treatment plan for underlying cause of condition will improve Outcome: Progressing Pt attends evening group and takes medications as prescribed.  Problem: Coping: Goal: Ability to verbalize feelings will improve Outcome: Progressing Pt discusses with Clinical research associate a desire to come home. He reports that he is feeling slightly anxious and restless over when he will be discharged.

## 2015-09-09 NOTE — Progress Notes (Signed)
D: Pt is active in the milieu this evening. Affect is bright. Pt reports that he is feeling restless due to his desire to be discharged soon. He denies SI/HI/AVH at this time. Denies pain. A: Emotional support and encouragement provided. Medications administered with education. q15 minute safety checks maintained. R: Pt remains free from harm. Will continue to monitor.

## 2015-09-09 NOTE — Progress Notes (Signed)
Patient discharged home. DC instructions provided and explained. Medications reviewed. Rx given. All questions answered. Belongings returned. Pt stable at discharge. Denies SI, HI, AVH. No psychotic behaviors noted.

## 2015-09-09 NOTE — Discharge Summary (Signed)
Physician Discharge Summary Note  Patient:  Manuel Richards is an 25 y.o., male MRN:  [id number] DOB:  1990/06/24 Patient phone:  [phone] (home)  Patient address:   [address] [place] [address],  Total Time spent with patient: 30 minutes  Date of Admission:  09/05/2015 Date of Discharge: 09/09/2015  Reason for Admission:  Suicidal ideation.  Identifying data. Manuel Richards is a 25 year old male with history of psychosis and mood instability.  Chief complaint. "I am better now."  History of present illness. Information was obtained from the patient and the chart. The patient was brought to the hospital after an episode of insomnia, paranoia, auditory hallucinations and bizarre behavior. He was disorganized, confused, almost catatonic in the emergency room and has not been able to participate in psychiatry for evaluation for several days. He was voicing suicidal and homicidal threats. Slowly, his symptoms improved. Today he seems better. He notes that he is in the hospital but is completely unaware of the situation that lead to his admission. He was hospitalized at [facility] and a half ago with a similar presentation. It was then understood that his psychosis were substance-induced. Today he adamantly denies any alcohol or illicit substance or prescription pill abuse but he is positive for marijuana. In the past he admitted to using mushrooms but he denies it now. Following discharge from the hospital he did not follow-up with his psychiatrist nor did he take any medications. The patient reports frequent symptoms of depression with poor sleep, decreased appetite, anhedonia, feeling of guilt and hopelessness worthlessness, poor energy and constant patient, social isolation but no suicidal thinking. He experiences frequent mood swings with periods of insomnia, talkativety, and hyperactivity. He experiences frequent paranoia and auditory hallucinations in the form of  chatter but is unable to make it out. He has infrequent panic attacks and social anxiety. He has some symptoms suggestive of OCD.  Past psychiatric history. There was one prior hospitalization for psychotic break. He was evaluated for substance abuse treatment at some point but did not participate. He never attempted suicide.  Family psychiatric history. Nonreported.  Social history. He lives with his girlfriend and his 49-month-old baby. He has supportive mother. He has been unemployed.  Principal Problem: Bipolar I disorder, most recent episode manic, severe with psychotic features [facility]) Discharge Diagnoses: Patient Active Problem List   Diagnosis Date Noted  . Bipolar I disorder, most recent episode manic, severe with psychotic features (HCC) [F31.2] 09/05/2015  . 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 Opioid use disorder, moderate, dependence (HCC) [F11.20] 12/19/2014  . Cannabis use disorder, moderate, dependence (HCC) [F12.20] 12/19/2014  . Tobacco use disorder [F17.200] 12/19/2014   Past Medical History:  Past Medical History:  Diagnosis Date  . ADHD (attention deficit hyperactivity disorder)   . Anxiety    History reviewed. No pertinent surgical history. Family History:  Family History  Problem Relation Age of Onset  . Bipolar disorder Mother   . Schizophrenia Maternal Grandmother   . Schizophrenia Maternal Uncle    Social History:  History  Alcohol Use  . Yes     History  Drug Use  . Types: Cocaine, Marijuana    Social History   Social History  . Marital status: Single    Spouse name: N/A  . Number of children: N/A  . Years of education: N/A   Social History Main Topics  . Smoking status: Current Every Day Smoker  . Smokeless tobacco:  Never Used  . Alcohol use Yes  . Drug use:     Types: Cocaine, Marijuana  . Sexual activity: Not Asked   Other Topics Concern  . None   Social History Narrative  .  None    Hospital Course:    Manuel Richards is a 25 year old male with a history of psychosis and mood instability admitted in the manic psychotic episode.  1. Suicidal and homicidal ideation. This has resolved. The patient is able to contract for safety. He is forward thinking and optimistic about the future. He is a loving father.   2. Mood and psychosis. He was started on Risperdal for psychosis and Trileptal for mood stabilization.  3. Metabolic syndrome screening. Lipid profile, TSH are Hemoglobin A1c are normal. Prolactin 31.   4. Substance use. The patient adamantly denies any current substance use and declines treatment. His urine drug screen was positive for cannabis.  5. Insomnia. Trazodone was available.   6. Smoking. Nicotine patch was available.   7. Disposition. He was discharged to home with his girlfriend. He will follow up with [facility].  Physical Findings: AIMS: Facial and Oral Movements Muscles of Facial Expression: None, normal Lips and Perioral Area: None, normal Jaw: None, normal Tongue: None, normal,Extremity Movements Upper (arms, wrists, hands, fingers): None, normal Lower (legs, knees, ankles, toes): None, normal, Trunk Movements Neck, shoulders, hips: None, normal, Overall Severity Severity of abnormal movements (highest score from questions above): None, normal Incapacitation due to abnormal movements: None, normal Patient's awareness of abnormal movements (rate only patient's report):  (unable to assess. ), Dental Status Current problems with teeth and/or dentures?: Yes (teeth appears brown discolored.Marland Kitchen ) Does patient usually wear dentures?: No  CIWA:    COWS:     Musculoskeletal: Strength & Muscle Tone: within normal limits Gait & Station: normal Patient leans: N/A  Psychiatric Specialty Exam: Physical Exam  Nursing note and vitals reviewed.   Review of Systems  Psychiatric/Behavioral: Positive for substance abuse.    Blood pressure 110/84,  pulse 78, temperature 98.1 F (36.7 C), temperature source Oral, resp. rate 18, height 5\' 4"  (1.626 m), weight 52.6 kg (116 lb), SpO2 100 %.Body mass index is 19.91 kg/m.  See SRA.                                                  Sleep:  Number of Hours: 6.75     Have you used any form of tobacco in the last 30 days? (Cigarettes, Smokeless Tobacco, Cigars, and/or Pipes):  (unable to aseess. )  Has this patient used any form of tobacco in the last 30 days? (Cigarettes, Smokeless Tobacco, Cigars, and/or Pipes) Yes, Yes, A prescription for an FDA-approved tobacco cessation medication was offered at discharge and the patient refused  Blood Alcohol level:  Lab Results  Component Value Date   [facility] <5 09/01/2015   ETH <5 12/18/2014    Metabolic Disorder Labs:  Lab Results  Component Value Date   HGBA1C 6.1 (H) 09/06/2015   Lab Results  Component Value Date   PROLACTIN 31.6 (H) 09/06/2015   Lab Results  Component Value Date   CHOL 178 09/06/2015   TRIG 55 09/06/2015   HDL 40 (L) 09/06/2015   CHOLHDL 4.5 09/06/2015   VLDL 11 09/06/2015   LDLCALC 127 (H) 09/06/2015   LDLCALC 129 (H) 12/19/2014  See Psychiatric Specialty Exam and Suicide Risk Assessment completed by Attending Physician prior to discharge.  Discharge destination:  Home  Is patient on multiple antipsychotic therapies at discharge:  No   Has Patient had three or more failed trials of antipsychotic monotherapy by history:  No  Recommended Plan for Multiple Antipsychotic Therapies: NA  Discharge Instructions    Diet - low sodium heart healthy    Complete by:  As directed   Increase activity slowly    Complete by:  As directed       Medication List    TAKE these medications     Indication  Oxcarbazepine 300 MG tablet Commonly known as:  TRILEPTAL Take 1 tablet (300 mg total) by mouth 2 (two) times daily.  Indication:  Manic-Depression   risperiDONE 1 MG tablet Commonly known as:   RISPERDAL Take 1 tablet (1 mg total) by mouth 2 (two) times daily.  Indication:  Manic-Depression   traZODone 100 MG tablet Commonly known as:  DESYREL Take 1 tablet (100 mg total) by mouth at bedtime.  Indication:  Trouble Sleeping        Follow-up recommendations:  Activity:  as tolerated. Diet:  regular. Other:  keep follow up appointments.  Comments:    Signed: Kristine Linea, MD 09/09/2015, 5:52 AM

## 2015-09-09 NOTE — [facility] Suicide Risk Assessment (Signed)
[facility] Discharge Suicide Risk Assessment   Principal Problem: Bipolar I disorder, most recent episode manic, severe with psychotic features [facility]) Discharge Diagnoses:  Patient Active Problem List   Diagnosis Date Noted  . Bipolar I disorder, most recent episode manic, severe with psychotic features (HCC) [F31.2] 09/05/2015  . 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 Opioid use disorder, moderate, dependence (HCC) [F11.20] 12/19/2014  . Cannabis use disorder, moderate, dependence (HCC) [F12.20] 12/19/2014  . Tobacco use disorder [F17.200] 12/19/2014    Total Time spent with patient: 30 minutes  Musculoskeletal: Strength & Muscle Tone: within normal limits Gait & Station: normal Patient leans: N/A  Psychiatric Specialty Exam: Review of Systems  Psychiatric/Behavioral: Positive for depression.  All other systems reviewed and are negative.   Blood pressure 110/84, pulse 78, temperature 98.1 F (36.7 C), temperature source Oral, resp. rate 18, height 5\' 4"  (1.626 m), weight 52.6 kg (116 lb), SpO2 100 %.Body mass index is 19.91 kg/m.  General Appearance: Casual  Eye Contact::  Good  Speech:  Clear and Coherent409  Volume:  Normal  Mood:  Euthymic  Affect:  Appropriate  Thought Process:  Goal Directed  Orientation:  Full (Time, Place, and Person)  Thought Content:  WDL  Suicidal Thoughts:  No  Homicidal Thoughts:  No  Memory:  Immediate;   Fair Recent;   Fair Remote;   Fair  Judgement:  Impaired  Insight:  Shallow  Psychomotor Activity:  Normal  Concentration:  Fair  Recall:  [employer] of Knowledge:Fair  Language: Fair  Akathisia:  No  Handed:  Right  AIMS (if indicated):     Assets:  Communication Skills Desire for Improvement Housing Physical Health Resilience Social Support  Sleep:  Number of Hours: 6.75  Cognition: WNL  ADL's:  Intact   Mental Status Per Nursing Assessment::   On Admission:     Demographic  Factors:  Male, Caucasian, Low socioeconomic status and Unemployed  Loss Factors: Decrease in vocational status and Financial problems/change in socioeconomic status  Historical Factors: Prior suicide attempts, Family history of mental illness or substance abuse and Impulsivity  Risk Reduction Factors:   Responsible for children under 67 years of age, Sense of responsibility to family, Living with another person, especially a relative and Positive social support  Continued Clinical Symptoms:  Bipolar Disorder:   Depressive phase Alcohol/Substance Abuse/Dependencies  Cognitive Features That Contribute To Risk:  None    Suicide Risk:  Minimal: No identifiable suicidal ideation.  Patients presenting with no risk factors but with morbid ruminations; may be classified as minimal risk based on the severity of the depressive symptoms    Plan Of Care/Follow-up recommendations:  Activity:  as tolerated. Diet:  regular. Other:  keep follow up appointments.  Kristine Linea, MD 09/09/2015, 5:47 AM

## 2015-09-09 NOTE — [facility] Group Notes (Signed)
[facility] LCSW Group Therapy   09/09/2015 9:30 am Type of Therapy: Group Therapy   Participation Level: Invited but did not attend.  Participation Quality: Invited but did not attend.  Africa Masaki, LCSWA     

## 2015-09-10 NOTE — Progress Notes (Signed)
  [facility] Adult Case Management Discharge Plan :  Will you be returning to the same living situation after discharge:  Yes,    At discharge, do you have transportation home?: Yes,    Do you have the ability to pay for your medications: No.Completed Medication Management Referral  Release of information consent forms completed and in the chart;  Patient's signature needed at discharge.  Patient to Follow up at: Follow-up Information    [facility] Go on 09/13/2015.   Why:  7:00am.  For Hospital Follow up, Medication Management and Counseling.  Please keep in touch with [facility] Peer Support Specialist Unk Pinto at [phone] Contact information: [address] [place] [address] [phone]           Next level of care provider has access to [facility] Link:no  Safety Planning and Suicide Prevention discussed: Yes,     Have you used any form of tobacco in the last 30 days? (Cigarettes, Smokeless Tobacco, Cigars, and/or Pipes):  (unable to aseess. )  Has patient been referred to the Quitline?: Patient refused referral  Patient has been referred for addiction treatment: Yes  Brin Ruggerio, Cleda Daub, MSW, LCSW 09/10/2015, 5:27 PM

## 2015-09-10 NOTE — Progress Notes (Deleted)
  [facility] Adult Case Management Discharge Plan :  Will you be returning to the same living situation after discharge:  No. Going to [facility] Adult care home with Jimmye Norman At discharge, do you have transportation home?: Yes,    Do you have the ability to pay for your medications: Yes,     Release of information consent forms completed and in the chart;  Patient's signature needed at discharge.  Patient to Follow up at: Follow-up Information    [facility]. Go on 09/13/2015.   Why:  7:00am.  For Hospital Follow up, Medication Management and Counseling.  Please keep in touch with [facility] Peer Support Specialist Unk Pinto at [phone] Contact information: [address] [place] [address] [phone]           Next level of care provider has access to [facility] Link:no  Safety Planning and Suicide Prevention discussed: Yes,     Have you used any form of tobacco in the last 30 days? (Cigarettes, Smokeless Tobacco, Cigars, and/or Pipes):  (unable to aseess. )  Has patient been referred to the Quitline?: Patient refused referral  Patient has been referred for addiction treatment: Yes  Nixon Kolton, Cleda Daub, MSW, LCSW 09/10/2015, 5:23 PM

## 2015-10-01 ENCOUNTER — Ambulatory Visit: Payer: Self-pay

## 2016-01-02 ENCOUNTER — Emergency Department
Admission: EM | Admit: 2016-01-02 | Discharge: 2016-01-02 | Disposition: A | Discharge status: Home / Self Care | Payer: Self-pay | Attending: Emergency Medicine | Admitting: Emergency Medicine

## 2016-01-02 ENCOUNTER — Encounter: Payer: Self-pay | Admitting: Emergency Medicine

## 2016-01-02 DIAGNOSIS — F909 Attention-deficit hyperactivity disorder, unspecified type: Secondary | ICD-10-CM | POA: Insufficient documentation

## 2016-01-02 DIAGNOSIS — K029 Dental caries, unspecified: Secondary | ICD-10-CM | POA: Insufficient documentation

## 2016-01-02 DIAGNOSIS — K047 Periapical abscess without sinus: Secondary | ICD-10-CM | POA: Insufficient documentation

## 2016-01-02 DIAGNOSIS — Z79899 Other long term (current) drug therapy: Secondary | ICD-10-CM | POA: Insufficient documentation

## 2016-01-02 DIAGNOSIS — F17200 Nicotine dependence, unspecified, uncomplicated: Secondary | ICD-10-CM | POA: Insufficient documentation

## 2016-01-02 MED ORDER — CLINDAMYCIN HCL 300 MG PO CAPS: 300.0000 mg | ORAL_CAPSULE | Freq: Three times a day (TID) | ORAL | 0 refills | Status: DC

## 2016-01-02 NOTE — ED Triage Notes (Signed)
Patient presents to the ED with dental pain x 2 weeks.  Patient states, " I was pressing on my tooth and infection squirted out, about a week ago."  Patient denies going to the dentist because of lack of insurance but patient is aware of the [facility].  Patient states, "I Just wanted to catch this before it goes septic or something."

## 2016-01-02 NOTE — ED Provider Notes (Signed)
[facility] Emergency Department Provider Note  ____________________________________________  Time seen: Approximately 1:47 PM  I have reviewed the triage vital signs and the nursing notes.   HISTORY  Chief Complaint Dental Pain    HPI Manuel Richards is a 25 y.o. male , NAD, presents to hemorrhage department with 2 week history of dental pain. Patient states he noted redness and swelling about his right upper incisor approximately one week ago. Pressed down on the area and noted "infection squirt" from the area. States he has chronic issues with his teeth including significant decay. He does [facility] but was concerned in regards to the infection. Denies any jaw pain, headaches, fever, chills, body aches. Has had no injury or trauma to the face or neck.   Past Medical History:  Diagnosis Date  . ADHD (attention deficit hyperactivity disorder)   . Anxiety     Patient Active Problem List   Diagnosis Date Noted  . Bipolar I disorder, most recent episode manic, severe with psychotic features (HCC) 09/05/2015  . Substance-induced psychotic disorder with hallucinations (HCC) 12/19/2014  . Opioid use disorder, moderate, dependence (HCC) 12/19/2014  . Cannabis use disorder, moderate, dependence (HCC) 12/19/2014  . Tobacco use disorder 12/19/2014    History reviewed. No pertinent surgical history.  Prior to Admission medications   Medication Sig Start Date End Date Taking? Authorizing Provider  clindamycin (CLEOCIN) 300 MG capsule Take 1 capsule (300 mg total) by mouth 3 (three) times daily. 01/02/16   Jami L Hagler, PA-C  Oxcarbazepine (TRILEPTAL) 300 MG tablet Take 1 tablet (300 mg total) by mouth 2 (two) times daily. 09/09/15   Manuel ProwsJolanta B Pucilowska, MD  risperiDONE (RISPERDAL) 1 MG tablet Take 1 tablet (1 mg total) by mouth 2 (two) times daily. 09/09/15   Manuel ProwsJolanta B Pucilowska, MD  traZODone (DESYREL) 100 MG  tablet Take 1 tablet (100 mg total) by mouth at bedtime. 09/09/15   Manuel ProwsJolanta B Pucilowska, MD    Allergies Penicillins  Family History  Problem Relation Age of Onset  . Bipolar disorder Mother   . Schizophrenia Maternal Grandmother   . Schizophrenia Maternal Uncle     Social History Social History  Substance Use Topics  . Smoking status: Current Every Day Smoker  . Smokeless tobacco: Never Used  . Alcohol use Yes     Review of Systems  Constitutional: No fever/chills ENT: Positive dental pain and gum line swelling and redness Musculoskeletal: Negative for general myalgias, neck or jaw pain.  Skin: Negative for rash, redness, swelling, skin sores.  ____________________________________________   PHYSICAL EXAM:  VITAL SIGNS: ED Triage Vitals  Enc Vitals Group     BP 01/02/16 1339 115/79     Pulse Rate 01/02/16 1339 77     Resp 01/02/16 1339 18     Temp 01/02/16 1339 97.6 F (36.4 C)     Temp Source 01/02/16 1339 Oral     SpO2 01/02/16 1339 97 %     Weight 01/02/16 1339 120 lb (54.4 kg)     Height 01/02/16 1339 5\' 5"  (1.651 m)     Head Circumference --      Peak Flow --      Pain Score 01/02/16 1340 5     Pain Loc --      Pain Edu? --      Excl. in GC? --     Constitutional: Alert and oriented. Well appearing and in no acute  distress. Eyes: Conjunctivae are normal.   Head: Atraumatic. ENT:      Nose: No congestion/rhinnorhea.      Mouth/Throat: Dental decay noted diffusely throughout the mouth. Gumline redness and mild swelling about teeth #5, 6 and 7. No active oozing or weeping. No bleeding. Mucous membranes are moist. Pharynx without erythema, swelling, exudate. Neck: Supple with full range of motion. Hematological/Lymphatic/Immunilogical: No cervical lymphadenopathy. Cardiovascular:  Good peripheral circulation. Respiratory: Normal respiratory effort without tachypnea or retractions. Neurologic:  Normal speech and language. No gross focal neurologic  deficits are appreciated.  Skin:  Skin is warm, dry and intact. No rash noted. Psychiatric: Mood and affect are normal. Speech and behavior are normal. Patient exhibits appropriate insight and judgement.   ____________________________________________   LABS  None ____________________________________________  EKG  None ____________________________________________  RADIOLOGY  None ____________________________________________    PROCEDURES  Procedure(s) performed: None   Procedures   Medications - No data to display   ____________________________________________   INITIAL IMPRESSION / ASSESSMENT AND PLAN / ED COURSE  Pertinent labs & imaging results that were available during my care of the patient were reviewed by me and considered in my medical decision making (see chart for details).  Clinical Course     Patient's diagnosis is consistent with Dental abscess due to dental caries. Patient will be discharged home with prescriptions for clindamycin to take as directed. Patient is to follow up with [facility] if symptoms persist past this treatment course. Patient is given ED precautions to return to the ED for any worsening or new symptoms.    ____________________________________________  FINAL CLINICAL IMPRESSION(S) / ED DIAGNOSES  Final diagnoses:  Dental abscess  Dental caries      NEW MEDICATIONS STARTED DURING THIS VISIT:  Discharge Medication List as of 01/02/2016  2:01 PM    START taking these medications   Details  clindamycin (CLEOCIN) 300 MG capsule Take 1 capsule (300 mg total) by mouth 3 (three) times daily., Starting Thu 01/02/2016, Print             Manuel PigeonJami L Hagler, PA-C 01/02/16 1419    Jene Everyobert Kinner, MD 01/02/16 1536

## 2016-03-03 ENCOUNTER — Emergency Department: Payer: Self-pay

## 2016-03-03 ENCOUNTER — Encounter: Payer: Self-pay | Admitting: Emergency Medicine

## 2016-03-03 ENCOUNTER — Emergency Department
Admission: EM | Admit: 2016-03-03 | Discharge: 2016-03-03 | Disposition: A | Discharge status: Home / Self Care | Payer: Self-pay | Attending: Emergency Medicine | Admitting: Emergency Medicine

## 2016-03-03 DIAGNOSIS — J209 Acute bronchitis, unspecified: Secondary | ICD-10-CM | POA: Insufficient documentation

## 2016-03-03 DIAGNOSIS — F909 Attention-deficit hyperactivity disorder, unspecified type: Secondary | ICD-10-CM | POA: Insufficient documentation

## 2016-03-03 DIAGNOSIS — M940 Chondrocostal junction syndrome [Tietze]: Secondary | ICD-10-CM | POA: Insufficient documentation

## 2016-03-03 DIAGNOSIS — F17200 Nicotine dependence, unspecified, uncomplicated: Secondary | ICD-10-CM | POA: Insufficient documentation

## 2016-03-03 MED ORDER — PROMETHAZINE-DM 6.25-15 MG/5ML PO SYRP: 5.0000 mL | ORAL_SOLUTION | Freq: Four times a day (QID) | ORAL | 0 refills | Status: DC | PRN

## 2016-03-03 MED ORDER — AZITHROMYCIN 250 MG PO TABS: ORAL_TABLET | ORAL | 0 refills | Status: DC

## 2016-03-03 MED ORDER — NAPROXEN 500 MG PO TABS: 500.0000 mg | ORAL_TABLET | Freq: Two times a day (BID) | ORAL | 0 refills | Status: DC

## 2016-03-03 NOTE — ED Notes (Signed)
See triage note  States he developed cough about 2-3 weeks ago  Unsure of fever or chills  But now having pain to left side of chest with cough and inspiration

## 2016-03-03 NOTE — ED Provider Notes (Signed)
[facility] Emergency Department Provider Note  ____________________________________________  Time seen: Approximately 8:24 AM  I have reviewed the triage vital signs and the nursing notes.   HISTORY  Chief Complaint Cough    HPI Manuel Richards is a 26 y.o. male , NAD, presents to the emergency department with 3 week history of cough, chest congestion and left rib pain. Patient states he has had productive cough of thick sputum over the last 2-3 weeks. Over the last few days he has had increasing pain with cough about the left anterior rib line. Denies any shortness of breath or wheezing. States he has been smoking one half pack per cigarettes daily over the last 8 years. Denies nasal congestion, runny nose, sinus pressure, ear pressure. Denies sore throat, swelling of lips/tongue/throat. Has been eating and drinking well. Has had no fevers, chills or body aches.   Past Medical History:  Diagnosis Date  . ADHD (attention deficit hyperactivity disorder)   . Anxiety     Patient Active Problem List   Diagnosis Date Noted  . Bipolar I disorder, most recent episode manic, severe with psychotic features (HCC) 09/05/2015  . Substance-induced psychotic disorder with hallucinations (HCC) 12/19/2014  . Opioid use disorder, moderate, dependence (HCC) 12/19/2014  . Cannabis use disorder, moderate, dependence (HCC) 12/19/2014  . Tobacco use disorder 12/19/2014    History reviewed. No pertinent surgical history.  Prior to Admission medications   Medication Sig Start Date End Date Taking? Authorizing Provider  azithromycin (ZITHROMAX Z-PAK) 250 MG tablet Take 2 tablets (500 mg) on  Day 1,  followed by 1 tablet (250 mg) once daily on Days 2 through 5. 03/03/16   Durene Dodge L Shahd Occhipinti, PA-C  naproxen (NAPROSYN) 500 MG tablet Take 1 tablet (500 mg total) by mouth 2 (two) times daily with a meal. 03/03/16   Anvi Mangal L Rakhi Romagnoli, PA-C  promethazine-dextromethorphan (PROMETHAZINE-DM)  6.25-15 MG/5ML syrup Take 5 mLs by mouth 4 (four) times daily as needed for cough. 03/03/16   Leilany Digeronimo L Tarus Briski, PA-C    Allergies Penicillins  Family History  Problem Relation Age of Onset  . Bipolar disorder Mother   . Schizophrenia Maternal Grandmother   . Schizophrenia Maternal Uncle     Social History Social History  Substance Use Topics  . Smoking status: Current Every Day Smoker  . Smokeless tobacco: Never Used  . Alcohol use Yes     Review of Systems Constitutional: No fever/chills ENT: No sore throat, Sinus pressure, ear pressure, nasal congestion, runny nose. Cardiovascular: No chest pain. Respiratory: Positive cough, chest congestion. No shortness of breath. No wheezing.  Musculoskeletal: Negative for general myalgias.  Skin: Negative for rash. Neurological: Negative for headaches, focal weakness or numbness. 10-point ROS otherwise negative.  ____________________________________________   PHYSICAL EXAM:  VITAL SIGNS: ED Triage Vitals  Enc Vitals Group     BP 03/03/16 0808 125/84     Pulse Rate 03/03/16 0808 86     Resp 03/03/16 0808 20     Temp 03/03/16 0816 98.7 F (37.1 C)     Temp Source 03/03/16 0816 Oral     SpO2 03/03/16 0808 97 %     Weight 03/03/16 0804 120 lb (54.4 kg)     Height --      Head Circumference --      Peak Flow --      Pain Score 03/03/16 0804 4     Pain Loc --      Pain Edu? --  Excl. in GC? --      Constitutional: Alert and oriented. Well appearing and in no acute distress. Eyes: Conjunctivae are normal.  Head: Atraumatic. ENT:      Ears: TMs visualized bilaterally without erythema, effusion, bulging, perforation.      Nose: No congestion/rhinnorhea.      Mouth/Throat: Mucous membranes are moist. Poor dentition throughout. Pharynx without erythema, swelling, exudate. Uvula is midline. Airway is patent. Neck:  Supple with full range of motion. Hematological/Lymphatic/Immunilogical: No cervical  lymphadenopathy. Cardiovascular: Normal rate, regular rhythm. Normal S1 and S2.  Good peripheral circulation. Respiratory: Normal respiratory effort without tachypnea or retractions. Lungs CTAB with breath sounds noted in all lung fields. No wheeze, rhonchi, rales. Musculoskeletal: Mild tenderness to deep palpation about the musculoskeletal region of the left, lower anterior rib cage. No step-offs or bony abnormalities nor crepitus noted. Neurologic:  Normal speech and language. No gross focal neurologic deficits are appreciated.  Skin:  Skin is warm, dry and intact. No rash, redness, swelling, bruising noted. Psychiatric: Mood and affect are normal. Speech and behavior are normal. Patient exhibits appropriate insight and judgement.   ____________________________________________   LABS  None ____________________________________________  EKG  None ____________________________________________  RADIOLOGY I, [name], personally viewed and evaluated these images (plain radiographs) as part of my medical decision making, as well as reviewing the written report by the radiologist.  Dg Chest 2 View  Result Date: 03/03/2016 CLINICAL DATA:  2-3 week history of productive cough. EXAM: CHEST  2 VIEW COMPARISON:  01/06/2013 FINDINGS: The heart size and mediastinal contours are within normal limits. Both lungs are clear. The visualized skeletal structures are unremarkable. IMPRESSION: No active cardiopulmonary disease. Electronically Signed   By: Kennith Center M.D.   On: 03/03/2016 08:54    ____________________________________________    PROCEDURES  Procedure(s) performed: None   Procedures   Medications - No data to display   ____________________________________________   INITIAL IMPRESSION / ASSESSMENT AND PLAN / ED COURSE  Pertinent labs & imaging results that were available during my care of the patient were reviewed by me and considered in my medical decision making (see  chart for details).  Clinical Course     Patient's diagnosis is consistent with Acute bronchitis and costochondritis. Patient will be discharged home with prescriptions for azithromycin, Naprosyn and promethazine DM to take as directed. [facility] [place] [facility] if symptoms persist past this treatment course. Patient is given ED precautions to return to the ED for any worsening or new symptoms.    ____________________________________________  FINAL CLINICAL IMPRESSION(S) / ED DIAGNOSES  Final diagnoses:  Acute bronchitis, unspecified organism  Costochondritis      NEW MEDICATIONS STARTED DURING THIS VISIT:  Discharge Medication List as of 03/03/2016  9:04 AM    START taking these medications   Details  azithromycin (ZITHROMAX Z-PAK) 250 MG tablet Take 2 tablets (500 mg) on  Day 1,  followed by 1 tablet (250 mg) once daily on Days 2 through 5., Print    naproxen (NAPROSYN) 500 MG tablet Take 1 tablet (500 mg total) by mouth 2 (two) times daily with a meal., Starting Tue 03/03/2016, Print    promethazine-dextromethorphan (PROMETHAZINE-DM) 6.25-15 MG/5ML syrup Take 5 mLs by mouth 4 (four) times daily as needed for cough., Starting Tue 03/03/2016, Print             [name], PA-C 03/03/16 1103    Jennye Moccasin,  MD 03/03/16 1138

## 2016-03-03 NOTE — ED Triage Notes (Signed)
Pt presents with productive cough for a couple of days. NAD.

## 2016-05-21 ENCOUNTER — Encounter: Payer: Self-pay | Admitting: Emergency Medicine

## 2016-05-21 ENCOUNTER — Emergency Department
Admission: EM | Admit: 2016-05-21 | Discharge: 2016-05-22 | Disposition: A | Discharge status: Home / Self Care | Payer: Self-pay | Attending: Emergency Medicine | Admitting: Emergency Medicine

## 2016-05-21 DIAGNOSIS — F32A Depression, unspecified: Secondary | ICD-10-CM

## 2016-05-21 DIAGNOSIS — F17200 Nicotine dependence, unspecified, uncomplicated: Secondary | ICD-10-CM | POA: Insufficient documentation

## 2016-05-21 DIAGNOSIS — F329 Major depressive disorder, single episode, unspecified: Secondary | ICD-10-CM | POA: Insufficient documentation

## 2016-05-21 DIAGNOSIS — F909 Attention-deficit hyperactivity disorder, unspecified type: Secondary | ICD-10-CM | POA: Insufficient documentation

## 2016-05-21 DIAGNOSIS — Z79899 Other long term (current) drug therapy: Secondary | ICD-10-CM | POA: Insufficient documentation

## 2016-05-21 LAB — COMPREHENSIVE METABOLIC PANEL
ALT: 53 U/L (ref 17–63)
AST: 33 U/L (ref 15–41)
Albumin: 4.8 g/dL (ref 3.5–5.0)
Alkaline Phosphatase: 77 U/L (ref 38–126)
Anion gap: 10 (ref 5–15)
BUN: 9 mg/dL (ref 6–20)
CALCIUM: 9.8 mg/dL (ref 8.9–10.3)
CHLORIDE: 101 mmol/L (ref 101–111)
CO2: 26 mmol/L (ref 22–32)
CREATININE: 0.72 mg/dL (ref 0.61–1.24)
GFR calc Af Amer: >60 mL/min (ref >60)
Glucose, Bld: 107 mg/dL — ABNORMAL HIGH (ref 65–99)
Potassium: 3.2 mmol/L — ABNORMAL LOW (ref 3.5–5.1)
Sodium: 137 mmol/L (ref 135–145)
Total Bilirubin: 0.6 mg/dL (ref 0.3–1.2)
Total Protein: 8.3 g/dL — ABNORMAL HIGH (ref 6.5–8.1)

## 2016-05-21 LAB — CBC
HCT: 45.1 % (ref 40.0–52.0)
Hemoglobin: 15.1 g/dL (ref 13.0–18.0)
MCH: 30.5 pg (ref 26.0–34.0)
MCHC: 33.5 g/dL (ref 32.0–36.0)
MCV: 90.9 fL (ref 80.0–100.0)
PLATELETS: 360 10*3/uL (ref 150–440)
RBC: 4.95 MIL/uL (ref 4.40–5.90)
RDW: 13.1 % (ref 11.5–14.5)
WBC: 10.5 10*3/uL (ref 3.8–10.6)

## 2016-05-21 LAB — URINE DRUG SCREEN, QUALITATIVE (ARMC ONLY)
Amphetamines, Ur Screen: NOT DETECTED
BARBITURATES, UR SCREEN: NOT DETECTED
Benzodiazepine, Ur Scrn: NOT DETECTED
CANNABINOID 50 NG, UR ~~LOC~~: POSITIVE — AB
Cocaine Metabolite,Ur ~~LOC~~: POSITIVE — AB
MDMA (Ecstasy)Ur Screen: NOT DETECTED
Methadone Scn, Ur: NOT DETECTED
Opiate, Ur Screen: NOT DETECTED
Phencyclidine (PCP) Ur S: NOT DETECTED
TRICYCLIC, UR SCREEN: NOT DETECTED

## 2016-05-21 LAB — ETHANOL: Alcohol, Ethyl (B): <5 mg/dL (ref <5)

## 2016-05-21 LAB — ACETAMINOPHEN LEVEL: Acetaminophen (Tylenol), Serum: <10 ug/mL — ABNORMAL LOW (ref 10–30)

## 2016-05-21 LAB — SALICYLATE LEVEL

## 2016-05-21 MED ORDER — TRAZODONE HCL 100 MG PO TABS
100.0000 mg | ORAL_TABLET | Freq: Once | ORAL | Status: AC
  Administered 2016-05-21: 100 mg via ORAL
  Filled 2016-05-21: qty 1

## 2016-05-21 NOTE — ED Notes (Signed)
TTS at bedside. 

## 2016-05-21 NOTE — ED Notes (Signed)
Pt's mother's contact info: [phone] [place]

## 2016-05-21 NOTE — ED Triage Notes (Signed)
Pt ambulatory to triage in NAD, report passive SI with no clear plan or intent.  Pt reports drug use to escape from things, reports last use this AM, IV cocaine.  Pt reports frequent IV drug user of any type.  Pt reports off his medications x 3 months, was on trazodone and risperdal.  Pt denies current SI, just states wants to get away from things.

## 2016-05-21 NOTE — ED Provider Notes (Signed)
[facility] Emergency Department Provider Note   ____________________________________________   I have reviewed the triage vital signs and the nursing notes.   HISTORY  Chief Complaint Medical Clearance   History limited by: Not Limited   HPI Manuel Richards is a 26 y.o. male who presents to the emergency department today because of concerns for psychiatric issues. Patient states he has been off of his medication for the past 3 or 4 months. He has had increasing depression. Occasional thoughts of suicidal ideation however denies any current. He is supposed be on Risperdal. He denies any medical complaints.    Past Medical History:  Diagnosis Date  . ADHD (attention deficit hyperactivity disorder)   . Anxiety     Patient Active Problem List   Diagnosis Date Noted  . Bipolar I disorder, most recent episode manic, severe with psychotic features (HCC) 09/05/2015  . Substance-induced psychotic disorder with hallucinations (HCC) 12/19/2014  . Opioid use disorder, moderate, dependence (HCC) 12/19/2014  . Cannabis use disorder, moderate, dependence (HCC) 12/19/2014  . Tobacco use disorder 12/19/2014    History reviewed. No pertinent surgical history.  Prior to Admission medications   Medication Sig Start Date End Date Taking? Authorizing Provider  azithromycin (ZITHROMAX Z-PAK) 250 MG tablet Take 2 tablets (500 mg) on  Day 1,  followed by 1 tablet (250 mg) once daily on Days 2 through 5. 03/03/16   Jami L Hagler, PA-C  naproxen (NAPROSYN) 500 MG tablet Take 1 tablet (500 mg total) by mouth 2 (two) times daily with a meal. 03/03/16   Jami L Hagler, PA-C  promethazine-dextromethorphan (PROMETHAZINE-DM) 6.25-15 MG/5ML syrup Take 5 mLs by mouth 4 (four) times daily as needed for cough. 03/03/16   Jami L Hagler, PA-C    Allergies Penicillins  Family History  Problem Relation Age of Onset  . Bipolar disorder Mother   . Schizophrenia Maternal Grandmother   .  Schizophrenia Maternal Uncle     Social History Social History  Substance Use Topics  . Smoking status: Current Every Day Smoker  . Smokeless tobacco: Never Used  . Alcohol use Yes    Review of Systems  Constitutional: Negative for fever. Cardiovascular: Negative for chest pain. Respiratory: Negative for shortness of breath. Gastrointestinal: Negative for abdominal pain, vomiting and diarrhea. Genitourinary: Negative for dysuria. Musculoskeletal: Negative for back pain. Skin: Negative for rash. Neurological: Negative for headaches, focal weakness or numbness. Psychiatric: Depression  10-point ROS otherwise negative.  ____________________________________________   PHYSICAL EXAM:  VITAL SIGNS: ED Triage Vitals  Enc Vitals Group     BP 05/21/16 1922 (!) 150/103     Pulse Rate 05/21/16 1922 (!) 120     Resp 05/21/16 1922 16     Temp 05/21/16 1958 97.5 F (36.4 C)     Temp Source 05/21/16 1958 Oral     SpO2 05/21/16 1922 100 %     Weight 05/21/16 1922 110 lb (49.9 kg)     Height 05/21/16 1922  (1.651 m)     Head Circumference --      Peak Flow --      Pain Score 05/21/16 1922 6   Constitutional: Alert and oriented. Well appearing and in no distress. Eyes: Conjunctivae are normal. Normal extraocular movements. ENT   Head: Normocephalic and atraumatic.   Nose: No congestion/rhinnorhea.   Mouth/Throat: Mucous membranes are moist.   Neck: No stridor. Hematological/Lymphatic/Immunilogical: No cervical lymphadenopathy. Cardiovascular: Normal rate, regular rhythm.  No murmurs, rubs, or gallops. Respiratory:  Normal respiratory effort without tachypnea nor retractions. Breath sounds are clear and equal bilaterally. No wheezes/rales/rhonchi. Gastrointestinal: Soft and non tender. No rebound. No guarding.  Genitourinary: Deferred Musculoskeletal: Normal range of motion in all extremities. No lower extremity edema. Neurologic:  Normal speech and language. No  gross focal neurologic deficits are appreciated.  Skin:  Skin is warm, dry and intact. No rash noted. Psychiatric: Mood and affect are normal. Speech and behavior are normal. Patient exhibits appropriate insight and judgment.  ____________________________________________    LABS (pertinent positives/negatives)  Labs Reviewed  COMPREHENSIVE METABOLIC PANEL - Abnormal; Notable for the following:       Result Value   Potassium 3.2 (*)    Glucose, Bld 107 (*)    Total Protein 8.3 (*)    All other components within normal limits  ACETAMINOPHEN LEVEL - Abnormal; Notable for the following:    Acetaminophen (Tylenol), Serum <10 (*)    All other components within normal limits  URINE DRUG SCREEN, QUALITATIVE ([facility] ONLY) - Abnormal; Notable for the following:    Cocaine Metabolite,Ur [place] POSITIVE (*)    Cannabinoid 50 Ng, Ur [place] POSITIVE (*)    All other components within normal limits  ETHANOL  SALICYLATE LEVEL  CBC     ____________________________________________   EKG  None  ____________________________________________    RADIOLOGY  None   ____________________________________________   PROCEDURES  Procedures  ____________________________________________   INITIAL IMPRESSION / ASSESSMENT AND PLAN / ED COURSE  Pertinent labs & imaging results that were available during my care of the patient were reviewed by me and considered in my medical decision making (see chart for details).  Patient comes into the emergency department for psychiatric eval. Patient states he has been off his medication past 4 months. He has had some depression with intermittent thoughts of SI however no Kernig's sign. At this point patient is volunteering to feel he does not warrant IVC. Will have psychiatry evaluate.   ____________________________________________   FINAL CLINICAL IMPRESSION(S) / ED DIAGNOSES  Final diagnoses:  Depression, unspecified depression type     Note: This  dictation was prepared with Dragon dictation. Any transcriptional errors that result from this process are unintentional     Phineas Semen, MD 05/21/16 2252

## 2016-05-22 MED ORDER — TRAZODONE HCL 100 MG PO TABS: 100.0000 mg | ORAL_TABLET | Freq: Every day | ORAL | 1 refills | Status: DC

## 2016-05-22 MED ORDER — RISPERIDONE 1 MG PO TABS: 1.0000 mg | ORAL_TABLET | Freq: Two times a day (BID) | ORAL | 1 refills | Status: DC

## 2016-05-22 MED ORDER — OXCARBAZEPINE 300 MG PO TABS
300.0000 mg | ORAL_TABLET | Freq: Two times a day (BID) | ORAL | 1 refills | Status: DC
Start: 2016-05-22 — End: 2016-08-11

## 2016-05-22 NOTE — ED Provider Notes (Signed)
Patient cleared for discharge by Dr. Luz Lex, MD 05/22/16 1350

## 2016-05-22 NOTE — BH Assessment (Signed)
Tele Assessment Note   Manuel Richards is an 26 y.o. male presenting voluntarily for assessment with c/o depression and "occasional" suicidal ideation. Pt denies suicidal plan and intent. Pt denies h/o suicide attempt. Pt denis HI. Pt denies h/o self-harm. Pt reports h/o bipolar I disorder. Pt does report h/o inpatient admission. Pt is non-compliant with medications and outpatient provider ([facility]) x3-4 mths. Pt discontinued Rx due to believing he no longer needed them. Pt reports polysubstance use as a means of self-medicating. Pt reports use of "anything I can get my hands on, mostly cocaine and narcotics". Pt reports intravenous use. Pt reports multiple sxs of depression. Pt identifies his wife being pregnant with another child as a current stressor.   Diagnosis: Bipolar I d/o (per report)  Past Medical History:  Past Medical History:  Diagnosis Date  . ADHD (attention deficit hyperactivity disorder)   . Anxiety     History reviewed. No pertinent surgical history.  Family History:  Family History  Problem Relation Age of Onset  . Bipolar disorder Mother   . Schizophrenia Maternal Grandmother   . Schizophrenia Maternal Uncle     Social History:  reports that he has been smoking.  He has never used smokeless tobacco. He reports that he drinks alcohol. He reports that he uses drugs, including Cocaine and Marijuana.  Additional Social History:  Alcohol / Drug Use Pain Medications: Pt reports abuse of "anything I can get my hands on". Pt denies heroin use. History of alcohol / drug use?: Yes (Pt reports abuse of "anything I can get my hands on". Pt denies heroin use. Pt does report cocaine use but provides no detials on additional substances.) Longest period of sobriety (when/how long): Not Reported. Substance #1 Name of Substance 1: Cocaine 1 - Age of First Use: Not Reported 1 - Amount (size/oz): .5 grams 1 - Frequency: Not Reported 1 - Duration: Ongoing 1 - Last Use / Amount: Not  Reported  CIWA: CIWA-Ar BP: 128/81 Pulse Rate: (!) 104 COWS:    PATIENT STRENGTHS: (choose at least two) Average or above average intelligence Supportive family/friends  Allergies:  Allergies  Allergen Reactions  . Penicillins Other (See Comments)    Reaction:  Unknown; childhood reaction     Home Medications:  (Not in a hospital admission)  OB/GYN Status:  No LMP for male patient.  General Assessment Data Location of Assessment: [facility] - Inpatient ED TTS Assessment: In system Is this a Tele or Face-to-Face Assessment?: Face-to-Face Is this an Initial Assessment or a Re-assessment for this encounter?: Initial Assessment Marital status: Married Is patient pregnant?: No Pregnancy Status: No Living Arrangements: Children, Spouse/significant other Can pt return to current living arrangement?: Yes Admission Status: Voluntary Is patient capable of signing voluntary admission?: Yes Referral Source: Self/Family/Friend Insurance type: Self-Pay     Crisis Care Plan Living Arrangements: Children, Spouse/significant other Name of Psychiatrist: None Name of Therapist: None  Education Status Is patient currently in school?: No Highest grade of school patient has completed: 10  Risk to self with the past 6 months Suicidal Ideation: No-Not Currently/Within Last 6 Months Has patient been a risk to self within the past 6 months prior to admission? : No Suicidal Intent: No Has patient had any suicidal intent within the past 6 months prior to admission? : No Is patient at risk for suicide?: No Suicidal Plan?: No Has patient had any suicidal plan within the past 6 months prior to admission? : No Access to Means: No What has been your  use of drugs/alcohol within the last 12 months?: Pt reports daily use of cocaine and narcotics Previous Attempts/Gestures: No Other Self Harm Risks: None compliant with medications Intentional Self Injurious Behavior: None Family Suicide History: No (h/o  suicide attempt) Recent stressful life event(s): Other (Comment) (Wife is currentlsy pregnant) Persecutory voices/beliefs?: No Depression: Yes Depression Symptoms: Insomnia, Tearfulness, Isolating Substance abuse history and/or treatment for substance abuse?: Yes Suicide prevention information given to non-admitted patients: Yes  Risk to Others within the past 6 months Homicidal Ideation: No Does patient have any lifetime risk of violence toward others beyond the six months prior to admission? : No Thoughts of Harm to Others: No Current Homicidal Intent: No Current Homicidal Plan: No Access to Homicidal Means: No History of harm to others?: No Assessment of Violence: None Noted Does patient have access to weapons?: No Criminal Charges Pending?: No Does patient have a court date: Yes Court Date:  (unknown-speeding ) Is patient on probation?: No  Psychosis Hallucinations: None noted Delusions: None noted  Mental Status Report Appearance/Hygiene: In scrubs Eye Contact: Good Motor Activity: Unremarkable Speech: Logical/coherent Level of Consciousness: Alert Mood: Pleasant Affect: Other (Comment) (Mood Congruent) Anxiety Level: None Thought Processes: Coherent, Relevant Judgement: Unimpaired Orientation: Person, Place, Time, Situation Obsessive Compulsive Thoughts/Behaviors: None  Cognitive Functioning Concentration: Normal Memory: Recent Intact, Remote Intact IQ: Average Insight: Fair Impulse Control: Fair Appetite:  (Not Reported) Weight Loss:  (Not Reported) Weight Gain:  (Not Reported) Sleep: Decreased Total Hours of Sleep: 10 (restless sleep)  ADLScreening [facility]) Patient's cognitive ability adequate to safely complete daily activities?: Yes Patient able to express need for assistance with ADLs?: Yes Independently performs ADLs?: Yes (appropriate for developmental age)  Prior Inpatient Therapy Prior Inpatient Therapy: Yes Prior Therapy  Facilty/Provider(s): [facility] Reason for Treatment: bipolar d/o  Prior Outpatient Therapy Prior Outpatient Therapy: Yes Prior Therapy Dates: Last attended 3-4 months ago Prior Therapy Facilty/Provider(s): [facility] Reason for Treatment: Substance use, depression Does patient have an ACCT team?: No Does patient have Intensive In-House Services?  : No Does patient have [facility] services? : No Does patient have P4CC services?: No  ADL Screening (condition at time of admission) Patient's cognitive ability adequate to safely complete daily activities?: Yes Is the patient deaf or have difficulty hearing?: No Does the patient have difficulty seeing, even when wearing glasses/contacts?: No Does the patient have difficulty concentrating, remembering, or making decisions?: No Patient able to express need for assistance with ADLs?: Yes Does the patient have difficulty dressing or bathing?: No Independently performs ADLs?: Yes (appropriate for developmental age) Does the patient have difficulty walking or climbing stairs?: No Weakness of Legs: None Weakness of Arms/Hands: None  Home Assistive Devices/Equipment Home Assistive Devices/Equipment: None  Therapy Consults (therapy consults require a physician order) PT Evaluation Needed: No OT Evalulation Needed: No SLP Evaluation Needed: No Abuse/Neglect Assessment (Assessment to be complete while patient is alone) Physical Abuse: Denies Verbal Abuse: Denies Sexual Abuse: Denies Exploitation of patient/patient's resources: Denies Self-Neglect: Denies Values / Beliefs Cultural Requests During Hospitalization: None Spiritual Requests During Hospitalization: None Consults Spiritual Care Consult Needed: No Social Work Consult Needed: No Merchant navy officer (For Healthcare) Does Patient Have a Medical Advance Directive?: No Would patient like information on creating a medical advance directive?: No - Patient declined    Additional Information 1:1 In  Past 12 Months?: No CIRT Risk: No Elopement Risk: No Does patient have medical clearance?: Yes     Disposition:  Disposition Initial Assessment Completed for this Encounter:  Yes Disposition of Patient: Other dispositions Other disposition(s): Other (Comment) (Psych MD Consult per EDP)  Aidenjames Heckmann J [place] 05/22/2016 12:50 AM

## 2016-05-22 NOTE — ED Notes (Signed)
Earlier in the shift patient reported that he is currently stressed out because his wife is pregnant.  Patient stated, "I'm young."  He stated that he did not want to tell the MD because he thought they may try to take his kids because of his drug usage.

## 2016-05-22 NOTE — ED Notes (Signed)
Pt in bed watching TV on approach. Open and spontaneous in conversation. No acute distress noted. Continues to deny SI/HI/AVH and contracts for safety. He does endorse a history of suicidal thoughts at times, but states, "Im too much of a pussy to kill myself, but I do think about it.". Otherwise states he feels like he would be safe to go home as he feels much better today. Safety has been maintained with Q15 min obs and ods obs. Will continue current POC pending disposition.

## 2016-08-11 ENCOUNTER — Inpatient Hospital Stay
Admission: RE | Admit: 2016-08-11 | Discharge: 2016-08-13 | DRG: 885 | Disposition: A | Discharge status: Home / Self Care | Payer: No Typology Code available for payment source | Source: Intra-hospital | Attending: Psychiatry | Admitting: Psychiatry

## 2016-08-11 ENCOUNTER — Encounter: Payer: Self-pay | Admitting: Emergency Medicine

## 2016-08-11 ENCOUNTER — Emergency Department
Admission: EM | Admit: 2016-08-11 | Discharge: 2016-08-11 | Disposition: A | Discharge status: Other Acute Inpatient Hospital | Payer: Self-pay | Attending: Emergency Medicine | Admitting: Emergency Medicine

## 2016-08-11 DIAGNOSIS — F314 Bipolar disorder, current episode depressed, severe, without psychotic features: Secondary | ICD-10-CM | POA: Diagnosis not present

## 2016-08-11 DIAGNOSIS — F319 Bipolar disorder, unspecified: Secondary | ICD-10-CM | POA: Diagnosis present

## 2016-08-11 DIAGNOSIS — R45851 Suicidal ideations: Secondary | ICD-10-CM | POA: Insufficient documentation

## 2016-08-11 DIAGNOSIS — F17201 Nicotine dependence, unspecified, in remission: Secondary | ICD-10-CM | POA: Diagnosis present

## 2016-08-11 DIAGNOSIS — F1220 Cannabis dependence, uncomplicated: Secondary | ICD-10-CM | POA: Diagnosis present

## 2016-08-11 DIAGNOSIS — F17210 Nicotine dependence, cigarettes, uncomplicated: Secondary | ICD-10-CM | POA: Diagnosis present

## 2016-08-11 DIAGNOSIS — F419 Anxiety disorder, unspecified: Secondary | ICD-10-CM | POA: Diagnosis present

## 2016-08-11 DIAGNOSIS — B1920 Unspecified viral hepatitis C without hepatic coma: Secondary | ICD-10-CM | POA: Diagnosis present

## 2016-08-11 DIAGNOSIS — F17200 Nicotine dependence, unspecified, uncomplicated: Secondary | ICD-10-CM | POA: Insufficient documentation

## 2016-08-11 DIAGNOSIS — F1320 Sedative, hypnotic or anxiolytic dependence, uncomplicated: Secondary | ICD-10-CM | POA: Diagnosis present

## 2016-08-11 DIAGNOSIS — F99 Mental disorder, not otherwise specified: Secondary | ICD-10-CM | POA: Insufficient documentation

## 2016-08-11 DIAGNOSIS — F1420 Cocaine dependence, uncomplicated: Secondary | ICD-10-CM | POA: Diagnosis present

## 2016-08-11 DIAGNOSIS — Z8619 Personal history of other infectious and parasitic diseases: Secondary | ICD-10-CM | POA: Diagnosis present

## 2016-08-11 DIAGNOSIS — G4700 Insomnia, unspecified: Secondary | ICD-10-CM | POA: Diagnosis present

## 2016-08-11 DIAGNOSIS — F1120 Opioid dependence, uncomplicated: Secondary | ICD-10-CM | POA: Diagnosis present

## 2016-08-11 DIAGNOSIS — F1421 Cocaine dependence, in remission: Secondary | ICD-10-CM | POA: Diagnosis present

## 2016-08-11 DIAGNOSIS — Z818 Family history of other mental and behavioral disorders: Secondary | ICD-10-CM | POA: Diagnosis not present

## 2016-08-11 DIAGNOSIS — F1121 Opioid dependence, in remission: Secondary | ICD-10-CM | POA: Diagnosis present

## 2016-08-11 DIAGNOSIS — F909 Attention-deficit hyperactivity disorder, unspecified type: Secondary | ICD-10-CM | POA: Diagnosis present

## 2016-08-11 LAB — COMPREHENSIVE METABOLIC PANEL
ALK PHOS: 119 U/L (ref 38–126)
ALT: 510 U/L — AB (ref 17–63)
ANION GAP: 9 (ref 5–15)
AST: 193 U/L — ABNORMAL HIGH (ref 15–41)
Albumin: 4.6 g/dL (ref 3.5–5.0)
BUN: 8 mg/dL (ref 6–20)
CO2: 27 mmol/L (ref 22–32)
CREATININE: 1.12 mg/dL (ref 0.61–1.24)
Calcium: 9.4 mg/dL (ref 8.9–10.3)
Chloride: 103 mmol/L (ref 101–111)
Glucose, Bld: 107 mg/dL — ABNORMAL HIGH (ref 65–99)
Potassium: 3.7 mmol/L (ref 3.5–5.1)
SODIUM: 139 mmol/L (ref 135–145)
TOTAL PROTEIN: 8 g/dL (ref 6.5–8.1)
Total Bilirubin: 1 mg/dL (ref 0.3–1.2)

## 2016-08-11 LAB — ETHANOL

## 2016-08-11 LAB — LIPID PANEL
CHOLESTEROL: 167 mg/dL (ref 0–200)
HDL: 49 mg/dL (ref >40)
LDL Cholesterol: 98 mg/dL (ref 0–99)
Total CHOL/HDL Ratio: 3.4 RATIO
Triglycerides: 101 mg/dL (ref <150)
VLDL: 20 mg/dL (ref 0–40)

## 2016-08-11 LAB — CBC
HCT: 41.2 % (ref 40.0–52.0)
Hemoglobin: 14.1 g/dL (ref 13.0–18.0)
MCH: 30.9 pg (ref 26.0–34.0)
MCHC: 34.3 g/dL (ref 32.0–36.0)
MCV: 90 fL (ref 80.0–100.0)
PLATELETS: 382 10*3/uL (ref 150–440)
RBC: 4.58 MIL/uL (ref 4.40–5.90)
RDW: 13.8 % (ref 11.5–14.5)
WBC: 10.8 10*3/uL — ABNORMAL HIGH (ref 3.8–10.6)

## 2016-08-11 LAB — SALICYLATE LEVEL

## 2016-08-11 LAB — URINE DRUG SCREEN, QUALITATIVE (ARMC ONLY)
Amphetamines, Ur Screen: NOT DETECTED
BARBITURATES, UR SCREEN: NOT DETECTED
BENZODIAZEPINE, UR SCRN: POSITIVE — AB
COCAINE METABOLITE, UR ~~LOC~~: POSITIVE — AB
Cannabinoid 50 Ng, Ur ~~LOC~~: POSITIVE — AB
MDMA (Ecstasy)Ur Screen: NOT DETECTED
METHADONE SCREEN, URINE: NOT DETECTED
Opiate, Ur Screen: POSITIVE — AB
Phencyclidine (PCP) Ur S: NOT DETECTED
TRICYCLIC, UR SCREEN: NOT DETECTED

## 2016-08-11 LAB — TSH: TSH: 0.968 u[IU]/mL (ref 0.350–4.500)

## 2016-08-11 LAB — ACETAMINOPHEN LEVEL

## 2016-08-11 MED ORDER — MAGNESIUM HYDROXIDE 400 MG/5ML PO SUSP
30.0000 mL | Freq: Every day | ORAL | Status: DC | PRN
  Filled 2016-08-11: qty 30

## 2016-08-11 MED ORDER — NICOTINE 21 MG/24HR TD PT24
21.0000 mg | MEDICATED_PATCH | Freq: Every day | TRANSDERMAL | Status: DC
  Filled 2016-08-11: qty 1

## 2016-08-11 MED ORDER — ALUM & MAG HYDROXIDE-SIMETH 200-200-20 MG/5ML PO SUSP: 30.0000 mL | ORAL | Status: DC | PRN

## 2016-08-11 MED ORDER — QUETIAPINE FUMARATE 200 MG PO TABS
100.0000 mg | ORAL_TABLET | Freq: Every day | ORAL | Status: DC
  Administered 2016-08-11 (×2): 100 mg via ORAL
  Filled 2016-08-11: qty 0.5
  Filled 2016-08-11: qty 1

## 2016-08-11 MED ORDER — ACETAMINOPHEN 325 MG PO TABS: 650.0000 mg | ORAL_TABLET | Freq: Four times a day (QID) | ORAL | Status: DC | PRN

## 2016-08-11 MED ORDER — MAGNESIUM HYDROXIDE 400 MG/5ML PO SUSP: 30.0000 mL | Freq: Every day | ORAL | Status: DC | PRN

## 2016-08-11 MED ORDER — HYDROXYZINE HCL 50 MG PO TABS: 50.0000 mg | ORAL_TABLET | Freq: Three times a day (TID) | ORAL | Status: DC | PRN

## 2016-08-11 MED ORDER — HYDROXYZINE HCL 25 MG PO TABS
50.0000 mg | ORAL_TABLET | Freq: Three times a day (TID) | ORAL | Status: DC | PRN
  Filled 2016-08-11: qty 2

## 2016-08-11 MED ORDER — TRAZODONE HCL 100 MG PO TABS
100.0000 mg | ORAL_TABLET | Freq: Every evening | ORAL | Status: DC | PRN
  Filled 2016-08-11: qty 1

## 2016-08-11 MED ORDER — TRAZODONE HCL 100 MG PO TABS: 100.0000 mg | ORAL_TABLET | Freq: Every evening | ORAL | Status: DC | PRN

## 2016-08-11 MED ORDER — ALUM & MAG HYDROXIDE-SIMETH 200-200-20 MG/5ML PO SUSP
30.0000 mL | ORAL | Status: DC | PRN
  Filled 2016-08-11: qty 30

## 2016-08-11 NOTE — ED Notes (Signed)
BEHAVIORAL HEALTH ROUNDING Patient sleeping: No. Patient alert and oriented: yes Behavior appropriate: Yes.  ; If no, describe:  Nutrition and fluids offered: yes Toileting and hygiene offered: Yes  Sitter present: q15 minute observations and security  monitoring Law enforcement present: Yes  ODS  

## 2016-08-11 NOTE — ED Notes (Signed)
SOC set up in pt room. Pt given breakfast tray.

## 2016-08-11 NOTE — ED Notes (Signed)
Patient observed lying in bed with eyes closed  Even, unlabored respirations observed   NAD pt appears to be sleeping  I will continue to monitor along with every 15 minute visual observations and ongoing security monitoring    

## 2016-08-11 NOTE — ED Notes (Signed)
Pt. Alert and oriented, warm and dry, in no distress. Pt. Denies SI, HI, and AVH. Pt. Encouraged to let nursing staff know of any concerns or needs. 

## 2016-08-11 NOTE — ED Notes (Signed)
Sandwich and soft drink given.  

## 2016-08-11 NOTE — ED Notes (Signed)
[facility]. Pt ambulated to bathroom unassisted and was told to put his socks on and pick up all trash in room before we leave. Pt has cooperated.

## 2016-08-11 NOTE — ED Notes (Signed)
Red tennis shoes, white footie socks, black shorts, grey boxers, t-shirt removed and placed in labeled pt belongings bag to be secured on nursing station, including labeled skateboard and black bookbag
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
## 2016-08-11 NOTE — ED Notes (Signed)
A male called to talk to patient. This Clinical research associatewriter asked if she had a code for patient. She stated no. Explained I could not tell her any info because of HIPPA policy. Male hung up phone.

## 2016-08-11 NOTE — ED Triage Notes (Addendum)
Patient ambulatory to triage with steady gait, without difficulty or distress noted, [facility] [place] [facility] [place]; pt reports his mother called police because "she thinks I'm suicidal but I have no thoughts of killing myself and I am not mentally unstable"

## 2016-08-11 NOTE — ED Notes (Signed)
PT  IVC  MOVED  TO  BHU  PENDING  PLACEMENT  SOC REPORT  ON  CHART

## 2016-08-11 NOTE — ED Notes (Signed)

## 2016-08-11 NOTE — ED Notes (Signed)
Pt woke up and given lunch tray. Pt fell back to sleep before eating.

## 2016-08-11 NOTE — ED Provider Notes (Signed)
[facility] Emergency Department Provider Note  ____________________________________________   First MD Initiated Contact with Patient 08/11/16 0017     (approximate)  I have reviewed the triage vital signs and the nursing notes.   HISTORY  Chief Complaint Mental Health Problem    HPI Manuel Richards is a 26 y.o. male with a history of ADHD and anxiety who is presenting to the emergency department today under involuntary commitment for threatening to kill himself and harm family members. However, the patient denies this. He denies any drinking or drug use beyond marijuana occasionally. He says that he was singing a song with suicide in the [place] but had no intention to hurt himself or anybody else.   Past Medical History:  Diagnosis Date  . ADHD (attention deficit hyperactivity disorder)   . Anxiety     Patient Active Problem List   Diagnosis Date Noted  . Major depressive disorder, recurrent severe without psychotic features (HCC) 08/11/2016  . Cocaine use disorder, moderate, dependence (HCC) 08/11/2016  . Bipolar I disorder, most recent episode manic, severe with psychotic features (HCC) 09/05/2015  . Substance-induced psychotic disorder with hallucinations (HCC) 12/19/2014  . Opioid use disorder, moderate, dependence (HCC) 12/19/2014  . Cannabis use disorder, moderate, dependence (HCC) 12/19/2014  . Tobacco use disorder 12/19/2014    History reviewed. No pertinent surgical history.  Prior to Admission medications   Medication Sig Start Date End Date Taking? Authorizing Provider  naproxen (NAPROSYN) 500 MG tablet Take 1 tablet (500 mg total) by mouth 2 (two) times daily with a meal. Patient not taking: Reported on 05/22/2016 03/03/16   Hagler, Jami L, PA-C  Oxcarbazepine (TRILEPTAL) 300 MG tablet Take 1 tablet (300 mg total) by mouth 2 (two) times daily. 05/22/16   Clapacs, Jackquline Denmark, MD  promethazine-dextromethorphan (PROMETHAZINE-DM) 6.25-15  MG/5ML syrup Take 5 mLs by mouth 4 (four) times daily as needed for cough. Patient not taking: Reported on 05/22/2016 03/03/16   Hagler, Jami L, PA-C  risperiDONE (RISPERDAL) 1 MG tablet Take 1 tablet (1 mg total) by mouth 2 (two) times daily. 05/22/16   Clapacs, Jackquline Denmark, MD  traZODone (DESYREL) 100 MG tablet Take 1 tablet (100 mg total) by mouth at bedtime. 05/22/16   Clapacs, Jackquline Denmark, MD    Allergies Penicillins  Family History  Problem Relation Age of Onset  . Bipolar disorder Mother   . Schizophrenia Maternal Grandmother   . Schizophrenia Maternal Uncle     Social History Social History  Substance Use Topics  . Smoking status: Current Every Day Smoker  . Smokeless tobacco: Never Used  . Alcohol use Yes    Review of Systems  Constitutional: No fever/chills Eyes: No visual changes. ENT: No sore throat. Cardiovascular: Denies chest pain. Respiratory: Denies shortness of breath. Gastrointestinal: No abdominal pain.  No nausea, no vomiting.  No diarrhea.  No constipation. Genitourinary: Negative for dysuria. Musculoskeletal: Negative for back pain. Skin: Negative for rash. Neurological: Negative for headaches, focal weakness or numbness.   ____________________________________________   PHYSICAL EXAM:  VITAL SIGNS: ED Triage Vitals [08/11/16 0006]  Enc Vitals Group     BP (!) 136/100     Pulse Rate (!) 125     Resp 20     Temp 98.2 F (36.8 C)     Temp Source Oral     SpO2 100 %     Weight 116 lb (52.6 kg)     Height 5\' 5"  (1.651 m)  Head Circumference      Peak Flow      Pain Score      Pain Loc      Pain Edu?      Excl. in GC?     Constitutional: Alert and oriented. Well appearing and in no acute distress. Eyes: Conjunctivae are normal.  Head: Atraumatic. Nose: No congestion/rhinnorhea. Mouth/Throat: Mucous membranes are moist.  Neck: No stridor.   Cardiovascular: Normal rate, regular rhythm. Grossly normal heart sounds.  Respiratory: Normal respiratory  effort.  No retractions. Lungs CTAB. Gastrointestinal: Soft and nontender. No distention.  Musculoskeletal: No lower extremity tenderness nor edema.  No joint effusions. Neurologic:  Normal speech and language. No gross focal neurologic deficits are appreciated. Skin:  Skin is warm, dry and intact. No rash noted. Psychiatric: Pressured speech.  ____________________________________________   LABS (all labs ordered are listed, but only abnormal results are displayed)  Labs Reviewed  COMPREHENSIVE METABOLIC PANEL - Abnormal; Notable for the following:       Result Value   Glucose, Bld 107 (*)    AST 193 (*)    ALT 510 (*)    All other components within normal limits  ACETAMINOPHEN LEVEL - Abnormal; Notable for the following:    Acetaminophen (Tylenol), Serum <10 (*)    All other components within normal limits  CBC - Abnormal; Notable for the following:    WBC 10.8 (*)    All other components within normal limits  URINE DRUG SCREEN, QUALITATIVE ([facility] ONLY) - Abnormal; Notable for the following:    Cocaine Metabolite,Ur [place] POSITIVE (*)    Opiate, Ur Screen POSITIVE (*)    Cannabinoid 50 Ng, Ur [place] POSITIVE (*)    Benzodiazepine, Ur Scrn POSITIVE (*)    All other components within normal limits  ETHANOL  SALICYLATE LEVEL  LIPID PANEL  TSH  HEMOGLOBIN A1C   ____________________________________________  EKG   ____________________________________________  RADIOLOGY   ____________________________________________   PROCEDURES  Procedure(s) performed:   Procedures  Critical Care performed:   ____________________________________________   INITIAL IMPRESSION / ASSESSMENT AND PLAN / ED COURSE  Pertinent labs & imaging results that were available during my care of the patient were reviewed by me and considered in my medical decision making (see chart for details).  I we'll uphold the patient's involuntary commitment. The patient will be evaluated by a specialist  on-call psychiatry.      ____________________________________________   FINAL CLINICAL IMPRESSION(S) / ED DIAGNOSES  Suicidal ideation.    NEW MEDICATIONS STARTED DURING THIS VISIT:  New Prescriptions   No medications on file     Note:  This document was prepared using Dragon voice recognition software and may include unintentional dictation errors.     Myrna BlazerSchaevitz, David Matthew, MD 08/11/16 0300

## 2016-08-11 NOTE — ED Notes (Signed)
ED  Is the patient under IVC or is there intent for IVC: Yes.   Is the patient medically cleared: Yes.   Is there vacancy in the ED BHU: Yes.   Is the population mix appropriate for patient: Yes.   Is the patient awaiting placement in inpatient or outpatient setting:  Has the patient [facility] Survey of unit performed for contraband, proper placement and condition of furniture, tampering with fixtures in bathroom, shower, and each patient room: Yes.  ; Findings:  APPEARANCE/BEHAVIOR Calm and cooperative NEURO ASSESSMENT Orientation: oriented x4  Denies pain Hallucinations: No.None noted (Hallucinations) Speech: Normal Gait: normal RESPIRATORY ASSESSMENT Even  Unlabored respirations  CARDIOVASCULAR ASSESSMENT Pulses equal   regular rate  Skin warm and dry   GASTROINTESTINAL ASSESSMENT no GI complaint EXTREMITIES Full ROM  PLAN OF CARE Provide calm/safe environment. Vital signs assessed twice daily. ED BHU Assessment once each 12-hour shift. Collaborate with TTS daily or as condition indicates. Assure the ED provider has rounded once each shift. Provide and encourage hygiene. Provide redirection as needed. Assess for escalating behavior; address immediately and inform ED provider.  Assess family dynamic and appropriateness for visitation as needed: Yes.  ; If necessary, describe findings:  Educate the patient/family about BHU procedures/visitation: Yes.  ; If necessary, describe findings:
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
## 2016-08-11 NOTE — ED Notes (Signed)
BEHAVIORAL HEALTH ROUNDING Patient sleeping: Yes.   Patient alert and oriented: eyes closed  Appears to be asleep Behavior appropriate: Yes.  ; If no, describe:  Nutrition and fluids offered: Yes  Toileting and hygiene offered: sleeping Sitter present: q 15 minute observations and security monitoring Law enforcement present: yes  ODS 

## 2016-08-11 NOTE — ED Notes (Signed)
Report called to [employer] RN. Patient in agreement with transfer to BMU. Doctor call call notified of transfer and for orders.

## 2016-08-11 NOTE — ED Notes (Signed)
SOC is consulting at this time

## 2016-08-11 NOTE — ED Notes (Signed)
[facility] machine set up in room - I awakened him and encouraged him to stay awake for the consult - Pam to room to complete ECG and draw lab specimens   I inquired about his elevation in liver enzymes and he stated  "oh I know why they are high - I have hep C."  - I verbalized understanding  Assessment completed  He denies pain at this time

## 2016-08-12 DIAGNOSIS — B1920 Unspecified viral hepatitis C without hepatic coma: Secondary | ICD-10-CM | POA: Diagnosis present

## 2016-08-12 DIAGNOSIS — F1320 Sedative, hypnotic or anxiolytic dependence, uncomplicated: Secondary | ICD-10-CM | POA: Diagnosis present

## 2016-08-12 DIAGNOSIS — F314 Bipolar disorder, current episode depressed, severe, without psychotic features: Secondary | ICD-10-CM

## 2016-08-12 DIAGNOSIS — Z8619 Personal history of other infectious and parasitic diseases: Secondary | ICD-10-CM | POA: Diagnosis present

## 2016-08-12 LAB — HEMOGLOBIN A1C
HEMOGLOBIN A1C: 5.4 % (ref 4.8–5.6)
MEAN PLASMA GLUCOSE: 108 mg/dL

## 2016-08-12 MED ORDER — HYDROXYZINE HCL 50 MG PO TABS: 50.0000 mg | ORAL_TABLET | Freq: Three times a day (TID) | ORAL | 1 refills | Status: DC | PRN

## 2016-08-12 MED ORDER — QUETIAPINE FUMARATE 50 MG PO TABS: 50.0000 mg | ORAL_TABLET | Freq: Every day | ORAL | 1 refills | Status: DC

## 2016-08-12 MED ORDER — QUETIAPINE FUMARATE 25 MG PO TABS
50.0000 mg | ORAL_TABLET | Freq: Every day | ORAL | Status: DC
  Administered 2016-08-12: 50 mg via ORAL
  Filled 2016-08-12: qty 2

## 2016-08-12 NOTE — Tx Team (Signed)
Interdisciplinary Treatment and Diagnostic Plan Update  08/12/2016 Time of Session: 1105 Manuel ProwsJoshua D Richards MRN: [id number]  Principal Diagnosis: Bipolar I disorder, most recent episode depressed, severe without psychotic features (HCC)  Secondary Diagnoses: Principal Problem:   Bipolar I disorder, most recent episode depressed, severe without psychotic features (HCC) Active Problems:   Opioid use disorder, moderate, dependence (HCC)   Cannabis use disorder, moderate, dependence (HCC)   Tobacco use disorder   Cocaine use disorder, moderate, dependence (HCC)   Sedative, hypnotic or anxiolytic use disorder, severe, dependence (HCC)   Hepatitis C   Current Medications:  Current Facility-Administered Medications  Medication Dose Route Frequency Provider Last Rate Last Dose  . acetaminophen (TYLENOL) tablet 650 mg  650 mg Oral Q6H PRN Jimmy FootmanHernandez-Gonzalez, Andrea, MD      . alum & mag hydroxide-simeth (MAALOX/MYLANTA) 200-200-20 MG/5ML suspension 30 mL  30 mL Oral Q4H PRN Jimmy FootmanHernandez-Gonzalez, Andrea, MD      . hydrOXYzine (ATARAX/VISTARIL) tablet 50 mg  50 mg Oral TID PRN Jimmy FootmanHernandez-Gonzalez, Andrea, MD      . magnesium hydroxide (MILK OF MAGNESIA) suspension 30 mL  30 mL Oral Daily PRN Jimmy FootmanHernandez-Gonzalez, Andrea, MD      . nicotine (NICODERM CQ - dosed in mg/24 hours) patch 21 mg  21 mg Transdermal Q0600 Jimmy FootmanHernandez-Gonzalez, Andrea, MD      . QUEtiapine (SEROQUEL) tablet 50 mg  50 mg Oral QHS Pucilowska, Jolanta B, MD      . traZODone (DESYREL) tablet 100 mg  100 mg Oral QHS PRN Jimmy FootmanHernandez-Gonzalez, Andrea, MD       PTA Medications: Prescriptions Prior to Admission  Medication Sig Dispense Refill Last Dose  . hydrOXYzine (ATARAX/VISTARIL) 25 MG tablet Take 25 mg by mouth every 6 (six) hours as needed.     . traZODone (DESYREL) 100 MG tablet Take 1 tablet (100 mg total) by mouth at bedtime. 30 tablet 1     Patient Stressors: Legal issue Marital or family conflict Medication change or  noncompliance Substance abuse  Patient Strengths: Ability for insight Average or above average intelligence Capable of independent living Supportive family/friends  Treatment Modalities: Medication Management, Group therapy, Case management,  1 to 1 session with clinician, Psychoeducation, Recreational therapy.   Physician Treatment Plan for Primary Diagnosis: Bipolar I disorder, most recent episode depressed, severe without psychotic features (HCC) Long Term Goal(s): Improvement in symptoms so as ready for discharge Improvement in symptoms so as ready for discharge   Short Term Goals: Ability to identify changes in lifestyle to reduce recurrence of condition will improve Ability to verbalize feelings will improve Ability to disclose and discuss suicidal ideas Ability to demonstrate self-control will improve Ability to identify and develop effective coping behaviors will improve Compliance with prescribed medications will improve Ability to identify triggers associated with substance abuse/mental health issues will improve Ability to identify changes in lifestyle to reduce recurrence of condition will improve Ability to demonstrate self-control will improve Ability to identify triggers associated with substance abuse/mental health issues will improve  Medication Management: Evaluate patient's response, side effects, and tolerance of medication regimen.  Therapeutic Interventions: 1 to 1 sessions, Unit Group sessions and Medication administration.  Evaluation of Outcomes: Progressing  Physician Treatment Plan for Secondary Diagnosis: Principal Problem:   Bipolar I disorder, most recent episode depressed, severe without psychotic features (HCC) Active Problems:   Opioid use disorder, moderate, dependence (HCC)   Cannabis use disorder, moderate, dependence (HCC)   Tobacco use disorder   Cocaine use disorder, moderate, dependence (HCC)  Sedative, hypnotic or anxiolytic use  disorder, severe, dependence (HCC)   Hepatitis C  Long Term Goal(s): Improvement in symptoms so as ready for discharge Improvement in symptoms so as ready for discharge   Short Term Goals: Ability to identify changes in lifestyle to reduce recurrence of condition will improve Ability to verbalize feelings will improve Ability to disclose and discuss suicidal ideas Ability to demonstrate self-control will improve Ability to identify and develop effective coping behaviors will improve Compliance with prescribed medications will improve Ability to identify triggers associated with substance abuse/mental health issues will improve Ability to identify changes in lifestyle to reduce recurrence of condition will improve Ability to demonstrate self-control will improve Ability to identify triggers associated with substance abuse/mental health issues will improve     Medication Management: Evaluate patient's response, side effects, and tolerance of medication regimen.  Therapeutic Interventions: 1 to 1 sessions, Unit Group sessions and Medication administration.  Evaluation of Outcomes: Progressing   RN Treatment Plan for Primary Diagnosis: Bipolar I disorder, most recent episode depressed, severe without psychotic features (HCC) Long Term Goal(s): Knowledge of disease and therapeutic regimen to maintain health will improve  Short Term Goals: Ability to verbalize frustration and anger appropriately will improve, Ability to identify and develop effective coping behaviors will improve and Compliance with prescribed medications will improve  Medication Management: RN will administer medications as ordered by provider, will assess and evaluate patient's response and provide education to patient for prescribed medication. RN will report any adverse and/or side effects to prescribing provider.  Therapeutic Interventions: 1 on 1 counseling sessions, Psychoeducation, Medication administration, Evaluate  responses to treatment, Monitor vital signs and CBGs as ordered, Perform/monitor CIWA, COWS, AIMS and Fall Risk screenings as ordered, Perform wound care treatments as ordered.  Evaluation of Outcomes: Progressing   LCSW Treatment Plan for Primary Diagnosis: Bipolar I disorder, most recent episode depressed, severe without psychotic features (HCC) Long Term Goal(s): Safe transition to appropriate next level of care at discharge, Engage patient in therapeutic group addressing interpersonal concerns.  Short Term Goals: Engage patient in aftercare planning with referrals and resources and Facilitate patient progression through stages of change regarding substance use diagnoses and concerns  Therapeutic Interventions: Assess for all discharge needs, 1 to 1 time with Social worker, Explore available resources and support systems, Assess for adequacy in community support network, Educate family and significant other(s) on suicide prevention, Complete Psychosocial Assessment, Interpersonal group therapy.  Evaluation of Outcomes: Progressing   Progress in Treatment: Attending groups: No. Participating in groups: No. Taking medication as prescribed: Yes. Toleration medication: Yes. Family/Significant other contact made: No, will contact:  pt declined Patient understands diagnosis: Yes. Discussing patient identified problems/goals with staff: Yes. Medical problems stabilized or resolved: Yes. Denies suicidal/homicidal ideation: Yes. Issues/concerns per patient self-inventory: Yes. Other: none  New problem(s) identified: No, Describe:  none  New Short Term/Long Term Goal(s):  Discharge Plan or Barriers:   Reason for Continuation of Hospitalization: Medication stabilization  Estimated Length of Stay: 1-2 days.  Attendees: Patient: Manuel Richards 08/12/2016   Physician: Dr. Jennet Maduro, MD 08/12/2016   Nursing: Hulan Amato, RN 08/12/2016   RN Care Manager: 08/12/2016   Social Worker: Daleen Squibb, LCSW 08/12/2016   Recreational Therapist: Hershal Coria, LRT/CTRS  08/12/2016   Other: Arther Abbott, BS Chaplain 08/12/2016   Other:  08/12/2016   Other: 08/12/2016        Scribe for Treatment Team: Lorri Frederick, LCSW 08/12/2016 1:32 PM

## 2016-08-12 NOTE — [facility] Group Notes (Signed)
[facility] LCSW Group Therapy  08/12/2016 10:50 AM  Type of Therapy:  Group Therapy  Participation Level:  Patient did not attend group. CSW invited patient to group.   Summary of Progress/Problems: Emotional Regulation: Patients will identify both negative and positive emotions. They will discuss emotions they have difficulty regulating and how they impact their lives. Patients will be asked to identify healthy coping skills to combat unhealthy reactions to negative emotions.   Martie Fulgham G. Garnette CzechSampson MSW, LCSWA 08/12/2016, 10:51 AM
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
## 2016-08-12 NOTE — Plan of Care (Signed)
Problem: Activity: Goal: Sleeping patterns will improve Outcome: Progressing Patient slept for Estimated Hours of 6; q15 minutes safety round maintained, no injury or falls during this shift.    

## 2016-08-12 NOTE — Plan of Care (Signed)
Problem: Coping: Goal: Ability to verbalize frustrations and anger appropriately will improve Outcome: Progressing Patient denies suicidal or homicidal ideations and maintained safety.

## 2016-08-12 NOTE — Progress Notes (Signed)
Recreation Therapy Notes  Date: 06.27.18 Time: 1:00 pm Location: Craft Room  Group Topic: Self-esteem  Goal Area(s) Addresses:  Patient will be able to identify benefit of self-esteem. Patient will be able to identify ways to increase self-esteem.  Behavioral Response: Did not attend  Intervention: Self-Portrait  Activity: Patients were given a blank face worksheet and were instructed to draw a self-portrait of how they were feeling. Patients were given construction paper and were instructed to put their first name on it and one positive trait about themselves. Patients passed the papers around the room and wrote positive traits about peers. Patients were given blank face worksheets again to draw their self-portraits of how they felt after reading positive comments from peers.  Education: LRT educated patient on ways to increase their self-esteem.  Education Outcome: Patient did not attend group.   Clinical Observations/Feedback: Patient did not attend group.  Jasmane Brockway M, LRT/CTRS 08/12/2016 1:40 PM 
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
## 2016-08-12 NOTE — BHH Suicide Risk Assessment (Signed)
[facility] Admission Suicide Risk Assessment   Nursing information obtained from:    Demographic factors:    Current Mental Status:    Loss Factors:    Historical Factors:    Risk Reduction Factors:     Total Time spent with patient: 1 hour Principal Problem: Major depressive disorder, recurrent severe without psychotic features (HCC) Diagnosis:   Patient Active Problem List   Diagnosis Date Noted  . Sedative, hypnotic or anxiolytic use disorder, severe, dependence (HCC) [F13.20] 08/12/2016  . Major depressive disorder, recurrent severe without psychotic features (HCC) [F33.2] 08/11/2016  . Cocaine use disorder, moderate, dependence (HCC) [F14.20] 08/11/2016  . Bipolar I disorder, most recent episode manic, severe with psychotic features (HCC) [F31.2] 09/05/2015  . 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 Opioid use disorder, moderate, dependence (HCC) [F11.20] 12/19/2014  . Cannabis use disorder, moderate, dependence (HCC) [F12.20] 12/19/2014  . Tobacco use disorder [F17.200] 12/19/2014   Subjective Data: depression.  Continued Clinical Symptoms:  Alcohol Use Disorder Identification Test Final Score (AUDIT): 6 The "Alcohol Use Disorders Identification Test", Guidelines for Use in Primary Care, Second Edition.  World Science writer [facility]). Score between 0-7:  no or low risk or alcohol related problems. Score between 8-15:  moderate risk of alcohol related problems. Score between 16-19:  high risk of alcohol related problems. Score 20 or above:  warrants further diagnostic evaluation for alcohol dependence and treatment.   CLINICAL FACTORS:   Bipolar Disorder:   Depressive phase Depression:   Comorbid alcohol abuse/dependence Impulsivity Insomnia Alcohol/Substance Abuse/Dependencies   Musculoskeletal: Strength & Muscle Tone: within normal limits Gait & Station: normal Patient leans: N/A  Psychiatric Specialty Exam: Physical  Exam  Nursing note and vitals reviewed. Psychiatric: He has a normal mood and affect. His speech is normal and behavior is normal. Thought content normal. Cognition and memory are normal. He expresses impulsivity.    Review of Systems  Psychiatric/Behavioral: Positive for depression and substance abuse.  All other systems reviewed and are negative.   Blood pressure 105/70, pulse 75, temperature 97.7 F (36.5 C), temperature source Oral, resp. rate 16, height 5\' 5"  (1.651 m), weight 53.5 kg (118 lb), SpO2 98 %.Body mass index is 19.64 kg/m.  General Appearance: Casual  Eye Contact:  Good  Speech:  Clear and Coherent  Volume:  Normal  Mood:  Euthymic  Affect:  Appropriate  Thought Process:  Goal Directed and Descriptions of Associations: Intact  Orientation:  Full (Time, Place, and Person)  Thought Content:  WDL  Suicidal Thoughts:  No  Homicidal Thoughts:  No  Memory:  Immediate;   Fair Recent;   Fair Remote;   Fair  Judgement:  Poor  Insight:  Shallow  Psychomotor Activity:  Normal  Concentration:  Concentration: Fair and Attention Span: Fair  Recall:  [employer] of Knowledge:  Fair  Language:  Fair  Akathisia:  No  Handed:  Right  AIMS (if indicated):     Assets:  Communication Skills Desire for Improvement Physical Health Resilience Social Support  ADL's:  Intact  Cognition:  WNL  Sleep:  Number of Hours: 6      COGNITIVE FEATURES THAT CONTRIBUTE TO RISK:  None    SUICIDE RISK:   Minimal: No identifiable suicidal ideation.  Patients presenting with no risk factors but with morbid ruminations; may be classified as minimal risk based on the severity of the depressive symptoms  PLAN OF CARE: Hospital admission, medication management, substance abuse counseling, discharge planning.  Manuel Richards is a 26 year old male with a history of bipolar disorder and substance abuse commited for voicing suicidal threats.   1. Suicidal ideation. The patient adamantly denies  any thoughts, intentions, or plans to hurt himself or others. He is able to contract for safety. He is forward thinking and optimistic about the future. He is a loving father.   2. Mood. He was started on Seroquel in the ER for mood stabilization.   3. Metabolic syndrome screening. Lipid profile, TSH and Hemoglobin A1c are normal.    4. EKG. Normal sinus rhythm. QTc 422.   5. Substance use. The patient admits to "self medication". He was positive for benzodiazepines, cocaine, cannabis and opioids. [facility].   6. Insomnia. Improved with Seroquel.    7. Smoking. Nicotine patch was available.   8. Disposition. He will be discharged to home with his wife. He will follow up with [facility].    I certify that inpatient services furnished can reasonably be expected to improve the patient's condition.   Kristine LineaJolanta Jujhar Everett, MD 08/12/2016, 10:00 AM

## 2016-08-12 NOTE — Progress Notes (Signed)
Patient stayed in his bed most of the time.Patient states "I never being suicidal.I am just depressed being here.I am catching up my sleep."Patient looking forward for discharge tomorrow and look for a new job.Did not attend groups.Appetite and energy level good.Support & encouragement given.

## 2016-08-12 NOTE — Progress Notes (Signed)
   08/12/16 1105  Clinical Encounter Type  Visited With Patient;Health care provider  Visit Type Behavioral Health;Other (Comment) (treatment team)  Referral From Care management  Spiritual Encounters  Spiritual Needs Emotional  Stress Factors  Patient Stress Factors Financial concerns;Family relationships;Major life changes;Loss   Chaplain present during treatment team meeting. Patient stated he understands he made poor choices and is ready to move forward with treatment. Chaplain will meet with patient after lunch for follow up.

## 2016-08-12 NOTE — Tx Team (Signed)
Initial Treatment Plan 08/12/2016 12:37 AM Manuel ProwsJoshua D Richards ZOX:[id number]    PATIENT STRESSORS: Legal issue Marital or family conflict Medication change or noncompliance Substance abuse   PATIENT STRENGTHS: Ability for insight Average or above average intelligence Capable of independent living Supportive family/friends   PATIENT IDENTIFIED PROBLEMS: Mood Instability  Anger/Aggression/Anxiety  Substance Use   Treatments and Medication NonCompliance               DISCHARGE CRITERIA:  Ability to meet basic life and health needs Improved stabilization in mood, thinking, and/or behavior Motivation to continue treatment in a less acute level of care Verbal commitment to aftercare and medication compliance  PRELIMINARY DISCHARGE PLAN: Outpatient therapy Return to previous living arrangement  PATIENT/FAMILY INVOLVEMENT: This treatment plan has been presented to and reviewed with the patient, Manuel Richards  The patient and have been given the opportunity to ask questions and make suggestions.  Cleotis NipperAbiodun T Erline Siddoway, RN 08/12/2016, 12:37 AM
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
## 2016-08-13 NOTE — BHH Suicide Risk Assessment (Signed)
[facility]   Principal Problem: Bipolar I disorder, most recent episode depressed, severe without psychotic features [facility](HCC) Discharge Diagnoses:  Patient Active Problem List   Diagnosis Date Noted  . Sedative, hypnotic or anxiolytic use disorder, severe, dependence (HCC) [F13.20] 08/12/2016  . Hepatitis C [B19.20] 08/12/2016  . Cocaine use disorder, moderate, dependence (HCC) [F14.20] 08/11/2016  . Bipolar I disorder, most recent episode depressed, severe without psychotic features (HCC) [F31.4] 09/05/2015  . 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 Opioid use disorder, moderate, dependence (HCC) [F11.20] 12/19/2014  . Cannabis use disorder, moderate, dependence (HCC) [F12.20] 12/19/2014  . Tobacco use disorder [F17.200] 12/19/2014    Total Time spent with patient: 30 minutes  Musculoskeletal: Strength & Muscle Tone: within normal limits Gait & Station: normal Patient leans: N/A  Psychiatric Specialty Exam: Review of Systems  Psychiatric/Behavioral: Positive for substance abuse.  All other systems reviewed and are negative.   Blood pressure 93/76, pulse 85, temperature 98 F (36.7 C), temperature source Oral, resp. rate 18, height 5\' 5"  (1.651 m), weight 53.5 kg (118 lb), SpO2 98 %.Body mass index is 19.64 kg/m.  General Appearance: Casual  Eye Contact::  Good  Speech:  Clear and Coherent409  Volume:  Normal  Mood:  Euthymic  Affect:  Appropriate  Thought Process:  Goal Directed and Descriptions of Associations: Intact  Orientation:  Full (Time, Place, and Person)  Thought Content:  WDL  Suicidal Thoughts:  No  Homicidal Thoughts:  No  Memory:  Immediate;   Fair Recent;   Fair Remote;   Fair  Judgement:  Impaired  Insight:  Lacking  Psychomotor Activity:  Normal  Concentration:  Fair  Recall:  [employer]  Fund of Knowledge:Fair  Language: Fair  Akathisia:  No  Handed:  Right  AIMS (if indicated):      Assets:  Communication Skills Desire for Improvement Financial Resources/Insurance Housing Intimacy Physical Health Resilience Social Support  Sleep:  Number of Hours: 8  Cognition: WNL  ADL's:  Intact   Mental Status Per Nursing Assessment::   On Admission:     Demographic Factors:  Male, Caucasian, Low socioeconomic status and Unemployed  Loss Factors: Financial problems/change in socioeconomic status  Historical Factors: Prior suicide attempts, Family history of mental illness or substance abuse and Impulsivity  Risk Reduction Factors:   Responsible for children under 26 years of age, Sense of responsibility to family, Living with another person, especially a relative, Positive social support and Positive therapeutic relationship  Continued Clinical Symptoms:  Bipolar Disorder:   Depressive phase Alcohol/Substance Abuse/Dependencies  Cognitive Features That Contribute To Risk:  None    Suicide Risk:  Minimal: No identifiable suicidal ideation.  Patients presenting with no risk factors but with morbid ruminations; may be classified as minimal risk based on the severity of the depressive symptoms    Plan Of Care/Follow-up recommendations:  Activity:  As tolerated. Diet:  Low sodium heart healthy. Other:  Keep follow-up appointments.  Kristine LineaJolanta Pucilowska, MD 08/13/2016, 9:42 AM

## 2016-08-13 NOTE — Progress Notes (Signed)
  [facility] :  Will you be returning to the same living situation after discharge:  Yes,  aunt and uncle At discharge, do you have transportation home?: Yes,  aunt and uncle Do you have the ability to pay for your medications: No. Medication management clinic referral made.  Release of information consent forms completed and in the chart;  Patient's signature needed at discharge.  Patient to Follow up at: Follow-up Information    [facility] Go on 08/17/2016.   Why:  Please meet [facility]intoHarvey Bryant [facility] on Monday, 08/17/16, at 7am for your hospital discharge appointment.  Please bring a copy of your hospital discharge paperwork. Contact information: [address] [place] [id number] [phone]           Next level of care provider has access to [facility] Link:no  Safety Planning and Suicide Prevention discussed: No.Pt refused permission.  SPE completed with pt.  Have you used any form of tobacco in the last 30 days? (Cigarettes, Smokeless Tobacco, Cigars, and/or Pipes): Yes  Has patient been referred to the Quitline?: Patient refused referral  Patient has been referred for addiction treatment: Yes  Lorri FrederickWierda, Deontre Allsup Jon, LCSW 08/13/2016, 10:28 AM

## 2016-08-13 NOTE — BHH Suicide Risk Assessment (Signed)
BHH INPATIENT:  Family/Significant Other Suicide Prevention Education  Suicide Prevention Education:  Patient Refusal for Family/Significant Other Suicide Prevention Education: The patient Manuel Richards has refused to provide written consent for family/significant other to be provided Family/Significant Other Suicide Prevention Education during admission and/or prior to discharge.  Physician notified.  Lorri FrederickWierda, Kaelea Gathright Jon, LCSW 08/13/2016, 8:41 AM

## 2016-08-13 NOTE — BHH Group Notes (Signed)
BHH LCSW Group Therapy  08/13/2016 10:50 AM  Type of Therapy:  Group Therapy  Participation Level:  Minimal  Participation Quality:  Attentive  Affect:  Flat  Cognitive:  Alert  Insight:  None  Engagement in Therapy:  Limited and None  Modes of Intervention:  Activity, Discussion, Education, Problem-solving, Dance movement psychotherapisteality Testing, Socialization and Support  Summary of Progress/Problems: Balance in life: Patients will discuss the concept of balance and how it looks and feels to be unbalanced. Pt will identify areas in their life that is unbalanced and ways to become more balanced. They discussed what aspects in their lives has influenced their self care. Patients also discussed self care in the areas of self regulation/control, hygiene/appearance, sleep/relaxation, healthy leisure, healthy eating habits, exercise, inner peace/spirituality, self improvement, sobriety, and health management. They were challenged to identify changes that are needed in order to improve self care.  Manuel Richards G. Garnette CzechSampson MSW, LCSWA 08/13/2016, 10:50 AM

## 2016-08-13 NOTE — Progress Notes (Signed)
D: Pt denies SI/HI/AVH. Pt is pleasant and cooperative, affect is flat but brightens upon approach. Patient mood is labile, no distress noted, he appears less anxious and he is interacting with peers and staff appropriately.  A: Pt was offered support and encouragement. Pt was given scheduled medications. Pt was encouraged to attend groups. Q 15 minute checks were done for safety.  R:Pt attends groups and interacts well with peers and staff. Pt is taking medication. Pt has no complaints.Pt receptive to treatment and safety maintained on unit.
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
## 2016-08-13 NOTE — Progress Notes (Signed)
Bright affect.  Denies SI/HI/AVH.   Discharge instructions given, verbalized understanding.  Prescriptions given and personal belongings returned.  Escorted off unit by this Clinical research associatewriter to main entrance to travel home.

## 2016-08-13 NOTE — [facility] Group Notes (Signed)
[facility] LCSW Group Therapy Note  Type of Therapy and Topic:  Group Therapy:  Goals Group: SMART Goals  Participation Level:  Patient did not attend group. CSW invited patient to group.   Description of Group:   The purpose of a daily goals group is to assist and guide patients in setting recovery/wellness-related goals.  The objective is to set goals as they relate to the crisis in which they were admitted. Patients will be using SMART goal modalities to set measurable goals.  Characteristics of realistic goals will be discussed and patients will be assisted in setting and processing how one will reach their goal. Facilitator will also assist patients in applying interventions and coping skills learned in psycho-education groups to the SMART goal and process how one will achieve defined goal.  Therapeutic Goals: -Patients will develop and document one goal related to or their crisis in which brought them into treatment. -Patients will be guided by LCSW using SMART goal setting modality in how to set a measurable, attainable, realistic and time sensitive goal.  -Patients will process barriers in reaching goal. -Patients will process interventions in how to overcome and successful in reaching goal.   Summary of Patient Progress:  Patient Goal:  None identified at this time, patient did not attend group on this date.   Therapeutic Modalities:   Motivational Interviewing Engineer, manufacturing systemsCognitive Behavioral Therapy Crisis Intervention Model SMART goals setting  Isaiah Torok G. Garnette CzechSampson MSW, LCSWA 08/13/2016 10:50 AM

## 2016-08-13 NOTE — Plan of Care (Signed)
Problem: Coping: Goal: Ability to verbalize frustrations and anger appropriately will improve Outcome: Progressing Patient verbalized frustration to staff.    

## 2017-06-16 ENCOUNTER — Encounter: Payer: Self-pay | Admitting: Emergency Medicine

## 2017-06-16 ENCOUNTER — Other Ambulatory Visit: Payer: Self-pay

## 2017-06-16 DIAGNOSIS — Z5321 Procedure and treatment not carried out due to patient leaving prior to being seen by health care provider: Secondary | ICD-10-CM | POA: Insufficient documentation

## 2017-06-16 DIAGNOSIS — R1032 Left lower quadrant pain: Secondary | ICD-10-CM | POA: Insufficient documentation

## 2017-06-16 NOTE — ED Triage Notes (Signed)
Patient ambulatory to triage with steady gait, without difficulty or distress noted; pt reports left lower abd pain, nonradiating with no accomp symptoms; denies hx of same

## 2017-06-17 ENCOUNTER — Emergency Department
Admission: EM | Admit: 2017-06-17 | Discharge: 2017-06-17 | Disposition: A | Discharge status: Home / Self Care | Payer: Medicaid Other | Attending: Emergency Medicine | Admitting: Emergency Medicine

## 2017-06-17 NOTE — ED Notes (Signed)
Pt has not returned to ED lobby 

## 2017-06-17 NOTE — ED Notes (Signed)
Pt noted leaving triage room prior to having blood drawn, exited ED lobby

## 2017-06-17 NOTE — ED Notes (Signed)
This RN attempted to draw blood x 2. First nurse informed.

## 2017-11-18 ENCOUNTER — Encounter: Payer: Self-pay | Admitting: Psychiatry

## 2017-11-18 ENCOUNTER — Encounter: Payer: Self-pay | Admitting: Emergency Medicine

## 2017-11-18 ENCOUNTER — Emergency Department (EMERGENCY_DEPARTMENT_HOSPITAL)
Admission: EM | Admit: 2017-11-18 | Discharge: 2017-11-18 | Disposition: A | Discharge status: Other Acute Inpatient Hospital | Payer: Medicaid Other | Source: Home / Self Care | Attending: Emergency Medicine | Admitting: Emergency Medicine

## 2017-11-18 ENCOUNTER — Emergency Department
Admission: EM | Admit: 2017-11-18 | Discharge: 2017-11-18 | Disposition: A | Discharge status: Home / Self Care | Payer: Medicaid Other | Attending: Emergency Medicine | Admitting: Emergency Medicine

## 2017-11-18 ENCOUNTER — Other Ambulatory Visit: Payer: Self-pay

## 2017-11-18 ENCOUNTER — Inpatient Hospital Stay
Admission: AD | Admit: 2017-11-18 | Discharge: 2017-11-24 | DRG: 885 | Disposition: A | Discharge status: Home / Self Care | Payer: Medicaid Other | Source: Intra-hospital | Attending: Psychiatry | Admitting: Psychiatry

## 2017-11-18 DIAGNOSIS — T148XXA Other injury of unspecified body region, initial encounter: Secondary | ICD-10-CM

## 2017-11-18 DIAGNOSIS — F1910 Other psychoactive substance abuse, uncomplicated: Secondary | ICD-10-CM | POA: Diagnosis present

## 2017-11-18 DIAGNOSIS — F17201 Nicotine dependence, unspecified, in remission: Secondary | ICD-10-CM | POA: Diagnosis present

## 2017-11-18 DIAGNOSIS — F339 Major depressive disorder, recurrent, unspecified: Secondary | ICD-10-CM | POA: Diagnosis present

## 2017-11-18 DIAGNOSIS — Z046 Encounter for general psychiatric examination, requested by authority: Secondary | ICD-10-CM | POA: Insufficient documentation

## 2017-11-18 DIAGNOSIS — F329 Major depressive disorder, single episode, unspecified: Secondary | ICD-10-CM | POA: Insufficient documentation

## 2017-11-18 DIAGNOSIS — F17200 Nicotine dependence, unspecified, uncomplicated: Secondary | ICD-10-CM | POA: Diagnosis present

## 2017-11-18 DIAGNOSIS — F1420 Cocaine dependence, uncomplicated: Secondary | ICD-10-CM | POA: Diagnosis present

## 2017-11-18 DIAGNOSIS — B1920 Unspecified viral hepatitis C without hepatic coma: Secondary | ICD-10-CM | POA: Diagnosis present

## 2017-11-18 DIAGNOSIS — Y9389 Activity, other specified: Secondary | ICD-10-CM | POA: Diagnosis not present

## 2017-11-18 DIAGNOSIS — Z8619 Personal history of other infectious and parasitic diseases: Secondary | ICD-10-CM | POA: Diagnosis present

## 2017-11-18 DIAGNOSIS — F17210 Nicotine dependence, cigarettes, uncomplicated: Secondary | ICD-10-CM | POA: Diagnosis present

## 2017-11-18 DIAGNOSIS — R45851 Suicidal ideations: Secondary | ICD-10-CM | POA: Diagnosis present

## 2017-11-18 DIAGNOSIS — F314 Bipolar disorder, current episode depressed, severe, without psychotic features: Secondary | ICD-10-CM | POA: Diagnosis not present

## 2017-11-18 DIAGNOSIS — N485 Ulcer of penis: Secondary | ICD-10-CM | POA: Insufficient documentation

## 2017-11-18 DIAGNOSIS — F1120 Opioid dependence, uncomplicated: Secondary | ICD-10-CM | POA: Diagnosis present

## 2017-11-18 DIAGNOSIS — S0083XA Contusion of other part of head, initial encounter: Secondary | ICD-10-CM | POA: Diagnosis present

## 2017-11-18 DIAGNOSIS — F909 Attention-deficit hyperactivity disorder, unspecified type: Secondary | ICD-10-CM | POA: Diagnosis present

## 2017-11-18 DIAGNOSIS — F19951 Other psychoactive substance use, unspecified with psychoactive substance-induced psychotic disorder with hallucinations: Secondary | ICD-10-CM | POA: Diagnosis present

## 2017-11-18 DIAGNOSIS — Z880 Allergy status to penicillin: Secondary | ICD-10-CM

## 2017-11-18 DIAGNOSIS — F419 Anxiety disorder, unspecified: Secondary | ICD-10-CM | POA: Diagnosis present

## 2017-11-18 DIAGNOSIS — Z818 Family history of other mental and behavioral disorders: Secondary | ICD-10-CM

## 2017-11-18 DIAGNOSIS — G4700 Insomnia, unspecified: Secondary | ICD-10-CM | POA: Diagnosis present

## 2017-11-18 DIAGNOSIS — Y929 Unspecified place or not applicable: Secondary | ICD-10-CM | POA: Diagnosis not present

## 2017-11-18 DIAGNOSIS — F3342 Major depressive disorder, recurrent, in full remission: Secondary | ICD-10-CM

## 2017-11-18 DIAGNOSIS — F1220 Cannabis dependence, uncomplicated: Secondary | ICD-10-CM | POA: Diagnosis present

## 2017-11-18 DIAGNOSIS — F1121 Opioid dependence, in remission: Secondary | ICD-10-CM | POA: Diagnosis present

## 2017-11-18 DIAGNOSIS — Z590 Homelessness: Secondary | ICD-10-CM

## 2017-11-18 DIAGNOSIS — Y999 Unspecified external cause status: Secondary | ICD-10-CM | POA: Insufficient documentation

## 2017-11-18 DIAGNOSIS — F1421 Cocaine dependence, in remission: Secondary | ICD-10-CM | POA: Diagnosis present

## 2017-11-18 LAB — COMPREHENSIVE METABOLIC PANEL
ALT: 74 U/L — ABNORMAL HIGH (ref 0–44)
ANION GAP: 8 (ref 5–15)
AST: 60 U/L — ABNORMAL HIGH (ref 15–41)
Albumin: 4.6 g/dL (ref 3.5–5.0)
Alkaline Phosphatase: 107 U/L (ref 38–126)
BILIRUBIN TOTAL: 0.6 mg/dL (ref 0.3–1.2)
BUN: 16 mg/dL (ref 6–20)
CHLORIDE: 97 mmol/L — AB (ref 98–111)
CO2: 29 mmol/L (ref 22–32)
Calcium: 9.4 mg/dL (ref 8.9–10.3)
Creatinine, Ser: 0.94 mg/dL (ref 0.61–1.24)
GFR calc Af Amer: >60 mL/min (ref >60)
Glucose, Bld: 189 mg/dL — ABNORMAL HIGH (ref 70–99)
POTASSIUM: 4.1 mmol/L (ref 3.5–5.1)
Sodium: 134 mmol/L — ABNORMAL LOW (ref 135–145)
TOTAL PROTEIN: 8.2 g/dL — AB (ref 6.5–8.1)

## 2017-11-18 LAB — URINE DRUG SCREEN, QUALITATIVE (ARMC ONLY)
Amphetamines, Ur Screen: POSITIVE — AB
BENZODIAZEPINE, UR SCRN: NOT DETECTED
Barbiturates, Ur Screen: NOT DETECTED
Cannabinoid 50 Ng, Ur ~~LOC~~: POSITIVE — AB
Cocaine Metabolite,Ur ~~LOC~~: NOT DETECTED
MDMA (ECSTASY) UR SCREEN: NOT DETECTED
METHADONE SCREEN, URINE: NOT DETECTED
Opiate, Ur Screen: POSITIVE — AB
PHENCYCLIDINE (PCP) UR S: NOT DETECTED
TRICYCLIC, UR SCREEN: NOT DETECTED

## 2017-11-18 LAB — URINALYSIS, COMPLETE (UACMP) WITH MICROSCOPIC
BACTERIA UA: NONE SEEN
Bilirubin Urine: NEGATIVE
Glucose, UA: NEGATIVE mg/dL
KETONES UR: NEGATIVE mg/dL
Leukocytes, UA: NEGATIVE
Nitrite: NEGATIVE
Protein, ur: NEGATIVE mg/dL
SPECIFIC GRAVITY, URINE: 1.015 (ref 1.005–1.030)
pH: 5 (ref 5.0–8.0)

## 2017-11-18 LAB — CBC
HCT: 42.8 % (ref 40.0–52.0)
Hemoglobin: 14.9 g/dL (ref 13.0–18.0)
MCH: 31.7 pg (ref 26.0–34.0)
MCHC: 34.9 g/dL (ref 32.0–36.0)
MCV: 90.9 fL (ref 80.0–100.0)
PLATELETS: 363 10*3/uL (ref 150–440)
RBC: 4.71 MIL/uL (ref 4.40–5.90)
RDW: 13.5 % (ref 11.5–14.5)
WBC: 8.8 10*3/uL (ref 3.8–10.6)

## 2017-11-18 LAB — CHLAMYDIA/NGC RT PCR (ARMC ONLY)
CHLAMYDIA TR: NOT DETECTED
N GONORRHOEAE: NOT DETECTED

## 2017-11-18 LAB — SALICYLATE LEVEL

## 2017-11-18 LAB — ETHANOL

## 2017-11-18 LAB — ACETAMINOPHEN LEVEL

## 2017-11-18 MED ORDER — BACITRACIN-NEOMYCIN-POLYMYXIN 400-5-5000 EX OINT
TOPICAL_OINTMENT | Freq: Once | CUTANEOUS | Status: AC
  Administered 2017-11-18: 1 via TOPICAL
  Filled 2017-11-18: qty 1

## 2017-11-18 MED ORDER — MAGNESIUM HYDROXIDE 400 MG/5ML PO SUSP: 30.0000 mL | Freq: Every day | ORAL | Status: DC | PRN

## 2017-11-18 MED ORDER — TRAZODONE HCL 100 MG PO TABS
100.0000 mg | ORAL_TABLET | Freq: Every evening | ORAL | Status: DC | PRN
  Administered 2017-11-18 – 2017-11-23 (×4): 100 mg via ORAL
  Filled 2017-11-18 (×4): qty 1

## 2017-11-18 MED ORDER — IBUPROFEN 800 MG PO TABS: 800.0000 mg | ORAL_TABLET | Freq: Three times a day (TID) | ORAL | 0 refills | Status: DC | PRN

## 2017-11-18 MED ORDER — CYCLOBENZAPRINE HCL 10 MG PO TABS: 10.0000 mg | ORAL_TABLET | Freq: Three times a day (TID) | ORAL | 0 refills | Status: DC | PRN

## 2017-11-18 MED ORDER — HYDROXYZINE HCL 50 MG PO TABS
50.0000 mg | ORAL_TABLET | Freq: Three times a day (TID) | ORAL | Status: DC | PRN
  Administered 2017-11-18 – 2017-11-23 (×3): 50 mg via ORAL
  Filled 2017-11-18 (×3): qty 1

## 2017-11-18 MED ORDER — SILVER SULFADIAZINE 1 % EX CREA: TOPICAL_CREAM | CUTANEOUS | 1 refills | Status: DC

## 2017-11-18 MED ORDER — ALUM & MAG HYDROXIDE-SIMETH 200-200-20 MG/5ML PO SUSP: 30.0000 mL | ORAL | Status: DC | PRN

## 2017-11-18 MED ORDER — ACETAMINOPHEN 325 MG PO TABS
650.0000 mg | ORAL_TABLET | Freq: Four times a day (QID) | ORAL | Status: DC | PRN
  Administered 2017-11-18: 650 mg via ORAL
  Filled 2017-11-18: qty 2
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
## 2017-11-18 NOTE — ED Triage Notes (Signed)
Pt presents to ED with c/o suicidal ideation. Pt was D/C at approx 11am for rash and swelling. Pt D/C out of 17h. Pt also states "I'm suicidal right now so that sucks". Pt is alert and oriented, drinking a pepsi in triage.
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
## 2017-11-18 NOTE — ED Notes (Signed)
Attempted to wake patient up without success. Pt continues to sleep in hallway, able to reposition himself without difficulty. Will continue to monitor for further patient needs.

## 2017-11-18 NOTE — ED Notes (Signed)
Pt continues to sleep in hallway bed. Will continue to monitor for further patient needs.
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
## 2017-11-18 NOTE — ED Notes (Signed)
Vet, EDT attempted x 2 to draw blood without success.

## 2017-11-18 NOTE — ED Notes (Signed)
$  3.50 in quarters (1) Engineer, maintenance and phone both placed in plastic bag, labeled with pt's name  (1) Black shirt (1) Black shorts  (1) Pair of Black sandals  (1) Black boxer shorts  (1) Brown Back pack (1) Pair of black socks

## 2017-11-18 NOTE — BH Assessment (Signed)
Assessment Note  Manuel Richards is an 27 y.o. male who presents to the ER due to having thoughts of ending his life. Patient reports of having no plan but states if he leaves the hospital, he will do something to kill his self. Patient has had inpatient treatments for similar presentation.  Patient states his current stressor is being homeless. He state his wife and two children are living in [place] shelter and is unable to find housing.   During the interview, the patient was calm, cooperative and pleasant. He was able to provide appropriate answers to the questions. He denies HI and AV/H. He also denies involvement with the legal system.  Diagnosis: Depression  Past Medical History:  Past Medical History:  Diagnosis Date  . ADHD (attention deficit hyperactivity disorder)   . Anxiety     History reviewed. No pertinent surgical history.  Family History:  Family History  Problem Relation Age of Onset  . Bipolar disorder Mother   . Schizophrenia Maternal Grandmother   . Schizophrenia Maternal Uncle     Social History:  reports that he has been smoking cigarettes. He has been smoking about 0.50 packs per day. He has never used smokeless tobacco. He reports that he drinks alcohol. He reports that he has current or past drug history. Drugs: Cocaine, Marijuana, Benzodiazepines, and Opium.  Additional Social History:  Alcohol / Drug Use Pain Medications: See PTA Prescriptions: See PTA Over the Counter: See PTA History of alcohol / drug use?: Yes Longest period of sobriety (when/how long): Unable to quantify Negative Consequences of Use: (n/a) Withdrawal Symptoms: (n/a)  CIWA: CIWA-Ar BP: 122/82 Pulse Rate: 86 COWS:    Allergies:  Allergies  Allergen Reactions  . Penicillins Other (See Comments)    Reaction:  Unknown; childhood reaction     Home Medications:  (Not in a hospital admission)  OB/GYN Status:  No LMP for male patient.  General Assessment Data Location of  Assessment: [facility] ED TTS Assessment: In system Is this a Tele or Face-to-Face Assessment?: Face-to-Face Is this an Initial Assessment or a Re-assessment for this encounter?: Initial Assessment Patient Accompanied by:: N/A(Self) Language Other than English: No Living Arrangements: Homeless/Shelter What gender do you identify as?: Male Marital status: Single Pregnancy Status: No Living Arrangements: Other (Comment) Can pt return to current living arrangement?: Yes Admission Status: Voluntary Is patient capable of signing voluntary admission?: Yes Referral Source: Self/Family/Friend Insurance type: Medicaid  Medical Screening Exam [facility] Walk-in ONLY) Medical Exam completed: Yes  Crisis Care Plan Living Arrangements: Other (Comment) Legal Guardian: Other:(n/a) Name of Psychiatrist: Reports of none Name of Therapist: Reports of none  Education Status Is patient currently in school?: No Is the patient employed, unemployed or receiving disability?: Unemployed  Risk to self with the past 6 months Suicidal Ideation: Yes-Currently Present Has patient been a risk to self within the past 6 months prior to admission? : Yes Suicidal Intent: No Has patient had any suicidal intent within the past 6 months prior to admission? : No Is patient at risk for suicide?: Yes Suicidal Plan?: No Has patient had any suicidal plan within the past 6 months prior to admission? : No Access to Means: No What has been your use of drugs/alcohol within the last 12 months?: "I use everything" Previous Attempts/Gestures: Yes Other Self Harm Risks: Active Addiction Triggers for Past Attempts: None known Intentional Self Injurious Behavior: None Family Suicide History: Unknown Recent stressful life event(s): Other (Comment), Conflict (Comment), Financial Problems, Trauma (Comment) Persecutory  voices/beliefs?: No Depression: Yes Depression Symptoms: Feeling worthless/self pity, Isolating, Tearfulness, Fatigue,  Loss of interest in usual pleasures, Guilt, Insomnia Substance abuse history and/or treatment for substance abuse?: Yes Suicide prevention information given to non-admitted patients: Not applicable  Risk to Others within the past 6 months Homicidal Ideation: No Does patient have any lifetime risk of violence toward others beyond the six months prior to admission? : No Thoughts of Harm to Others: No Current Homicidal Intent: No Current Homicidal Plan: No Access to Homicidal Means: No Identified Victim: Reports of none History of harm to others?: No Violent Behavior Description: Reports of none Does patient have access to weapons?: No Criminal Charges Pending?: No Does patient have a court date: No Is patient on probation?: No  Psychosis Hallucinations: None noted Delusions: None noted  Mental Status Report Appearance/Hygiene: Unremarkable, In scrubs Eye Contact: Fair Motor Activity: Freedom of movement, Unremarkable Speech: Logical/coherent, Unremarkable Level of Consciousness: Alert Mood: Depressed, Sad, Pleasant Affect: Appropriate to circumstance, Depressed, Sad Anxiety Level: Minimal Thought Processes: Coherent, Relevant Judgement: Unimpaired Orientation: Person, Place, Time, Situation, Appropriate for developmental age Obsessive Compulsive Thoughts/Behaviors: Minimal  Cognitive Functioning Concentration: Normal Memory: Recent Intact, Remote Intact Is patient IDD: No  ADLScreening [facility] Assessment Services) Patient's cognitive ability adequate to safely complete daily activities?: Yes Patient able to express need for assistance with ADLs?: Yes Independently performs ADLs?: Yes (appropriate for developmental age)  Prior Inpatient Therapy Prior Inpatient Therapy: Yes Prior Therapy Dates: 07/2016 & 08/2015 Prior Therapy Facilty/Provider(s): [facility] Reason for Treatment: Depression  Prior Outpatient Therapy Prior Outpatient Therapy: No Does patient have an ACCT  team?: No Does patient have Intensive In-House Services?  : No Does patient have [facility] services? : No Does patient have P4CC services?: No  ADL Screening (condition at time of admission) Patient's cognitive ability adequate to safely complete daily activities?: Yes Is the patient deaf or have difficulty hearing?: No Does the patient have difficulty seeing, even when wearing glasses/contacts?: No Does the patient have difficulty concentrating, remembering, or making decisions?: No Patient able to express need for assistance with ADLs?: Yes Does the patient have difficulty dressing or bathing?: No Independently performs ADLs?: Yes (appropriate for developmental age) Does the patient have difficulty walking or climbing stairs?: No Weakness of Legs: None Weakness of Arms/Hands: None  Home Assistive Devices/Equipment Home Assistive Devices/Equipment: None  Therapy Consults (therapy consults require a physician order) PT Evaluation Needed: No OT Evalulation Needed: No SLP Evaluation Needed: No Abuse/Neglect Assessment (Assessment to be complete while patient is alone) Abuse/Neglect Assessment Can Be Completed: Yes Physical Abuse: Denies Verbal Abuse: Denies Sexual Abuse: Denies Exploitation of patient/patient's resources: Denies Self-Neglect: Denies Values / Beliefs Cultural Requests During Hospitalization: None Spiritual Requests During Hospitalization: None Consults Spiritual Care Consult Needed: No Social Work Consult Needed: No Merchant navy officer (For Healthcare) Does Patient Have a Medical Advance Directive?: No Would patient like information on creating a medical advance directive?: No - Patient declined       Child/Adolescent Assessment Running Away Risk: Denies(Patient is an adult)  Disposition:  Disposition Initial Assessment Completed for this Encounter: Yes  On Site Evaluation by:   Reviewed with Physician:    Lilyan Gilford MS, LCAS, LPC, NCC,  CCSI Therapeutic Triage Specialist 11/18/2017 5:48 PM

## 2017-11-18 NOTE — ED Notes (Signed)
Unable to obtain signature - both computer on wheels are not working. Charge nurse aware.

## 2017-11-18 NOTE — ED Notes (Signed)
Pt continues to rest in bed with lights dimmed. Respirations even and unlabored. Will continue to monitor for changes in patient condition/further orders.

## 2017-11-18 NOTE — ED Notes (Signed)
This RN to bedside to D/C patient. Pt refusing to wake up with this RN attempting to wake him, however pt will groan and move his head. MD made aware, VSS stable at this time. Will continue to monitor for further patient needs.

## 2017-11-18 NOTE — ED Notes (Signed)
VS remain stable, pt refuses to open eyes and interact with this RN. Pt moved to hallway bed until he wakes up. Dr. Mayford Knife aware.

## 2017-11-18 NOTE — ED Notes (Signed)
Pt sleeping. Lunch tray placed at pt bedside

## 2017-11-18 NOTE — BH Assessment (Signed)
Patient is to be admitted to [facility] by Dr. Toni Amend.  Attending Physician will be Dr. Jennet Maduro.   Patient has been assigned to room 322, by [facility] Charge Nurse Shatara.   Intake Paper Work has been signed and placed on patient chart.  ER staff is aware of the admission:  Dr. Derrill Kay, ER MD   Amy B., Patient's Nurse   Ethelene Browns, Patient Access.
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
## 2017-11-18 NOTE — ED Notes (Signed)
Pt resting in bed with lights dimmed for patient comfort. Regular, even and unlabored respirations visible. Will continue to monitor for further patient needs.

## 2017-11-19 ENCOUNTER — Other Ambulatory Visit: Payer: Self-pay

## 2017-11-19 DIAGNOSIS — F3342 Major depressive disorder, recurrent, in full remission: Secondary | ICD-10-CM

## 2017-11-19 DIAGNOSIS — F339 Major depressive disorder, recurrent, unspecified: Principal | ICD-10-CM

## 2017-11-19 LAB — TSH: TSH: 0.147 u[IU]/mL — AB (ref 0.350–4.500)

## 2017-11-19 LAB — LIPID PANEL
Cholesterol: 157 mg/dL (ref 0–200)
HDL: 40 mg/dL — ABNORMAL LOW (ref >40)
LDL Cholesterol: 102 mg/dL — ABNORMAL HIGH (ref 0–99)
Total CHOL/HDL Ratio: 3.9 RATIO
Triglycerides: 76 mg/dL (ref <150)
VLDL: 15 mg/dL (ref 0–40)

## 2017-11-19 MED ORDER — QUETIAPINE FUMARATE 25 MG PO TABS
50.0000 mg | ORAL_TABLET | Freq: Every day | ORAL | Status: DC
  Administered 2017-11-19 – 2017-11-23 (×5): 50 mg via ORAL
  Filled 2017-11-19 (×5): qty 2

## 2017-11-19 MED ORDER — QUETIAPINE FUMARATE 25 MG PO TABS: 50.0000 mg | ORAL_TABLET | Freq: Every day | ORAL | Status: DC

## 2017-11-19 MED ORDER — OLANZAPINE 5 MG PO TABS: 5.0000 mg | ORAL_TABLET | Freq: Every day | ORAL | Status: DC

## 2017-11-19 NOTE — BHH Group Notes (Signed)
BHH Group Notes:  (Nursing/MHT/Case Management/Adjunct)  Date:  11/19/2017  Time:  9:42 AM  Type of Therapy:  Psychoeducational Skills  Participation Level:  Did Not Attend  Manuel Richards 11/19/2017, 9:42 AM 

## 2017-11-19 NOTE — Plan of Care (Signed)
Patient is alert, but very isolative in his room. Patient interaction in with staff and peers in minimal; patient would not talk with nurse today instead cover his head and forward very little. Patient agreed to let nurse know if he was feeling SI or needed help with anything. The patient spent most of the day and evening in bed. Patient affect is irritable today; not willing to cooperate at all. Nurse will continue to monitor. 15 minute checks by MHT to continue. Problem: Education: Goal: Knowledge of [facility] Education information/materials will improve 11/19/2017 1516 by Leamon Arnt, RN Outcome: Not Progressing 11/19/2017 1516 by Leamon Arnt, RN Outcome: Not Progressing Goal: Emotional status will improve 11/19/2017 1516 by Leamon Arnt, RN Outcome: Not Progressing 11/19/2017 1516 by Leamon Arnt, RN Outcome: Not Progressing Goal: Mental status will improve 11/19/2017 1516 by Leamon Arnt, RN Outcome: Not Progressing 11/19/2017 1516 by Leamon Arnt, RN Outcome: Not Progressing Goal: Verbalization of understanding the information provided will improve 11/19/2017 1516 by Leamon Arnt, RN Outcome: Not Progressing 11/19/2017 1516 by Leamon Arnt, RN Outcome: Not Progressing   Problem: Activity: Goal: Interest or engagement in activities will improve 11/19/2017 1516 by Leamon Arnt, RN Outcome: Not Progressing 11/19/2017 1516 by Leamon Arnt, RN Outcome: Not Progressing Goal: Sleeping patterns will improve 11/19/2017 1516 by Leamon Arnt, RN Outcome: Not Progressing 11/19/2017 1516 by Leamon Arnt, RN Outcome: Not Progressing

## 2017-11-19 NOTE — BHH Group Notes (Signed)
  11/19/2017 1:00pm  Type of Therapy and Topic:  Group Therapy:  Feelings around Relapse and Recovery  Participation Level:  Did Not Attend   Description of Group:    Patients in this group will discuss emotions they experience before and after a relapse. They will process how experiencing these feelings, or avoidance of experiencing them, relates to having a relapse. Facilitator will guide patients to explore emotions they have related to recovery. Patients will be encouraged to process which emotions are more powerful. They will be guided to discuss the emotional reaction significant others in their lives may have to their relapse or recovery. Patients will be assisted in exploring ways to respond to the emotions of others without this contributing to a relapse.  Therapeutic Goals: Patient will identify two or more emotions that lead to a relapse for them Patient will identify two emotions that result when they relapse Patient will identify two emotions related to recovery Patient will demonstrate ability to communicate their needs through discussion and/or role plays   

## 2017-11-19 NOTE — Tx Team (Signed)
Initial Treatment Plan 11/19/2017 1:05 AM Manuel Richards ZOX:[id number]    PATIENT STRESSORS: Financial difficulties Health problems Marital or family conflict Substance abuse   PATIENT STRENGTHS: Active sense of humor General fund of knowledge Motivation for treatment/growth   PATIENT IDENTIFIED PROBLEMS: Suicidal Ideation  Substance abuse  "I want to go to a residential recovery treatment"  "I need help with housing"               DISCHARGE CRITERIA:  Ability to meet basic life and health needs Adequate post-discharge living arrangements Improved stabilization in mood, thinking, and/or behavior  PRELIMINARY DISCHARGE PLAN: Attend aftercare/continuing care group Attend 12-step recovery group Outpatient therapy  PATIENT/FAMILY INVOLVEMENT: This treatment plan has been presented to and reviewed with the patient, Manuel Richards, and/or family member.  The patient and family have been given the opportunity to ask questions and make suggestions.  Janne Lab, RN 11/19/2017, 1:05 AM
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
## 2017-11-19 NOTE — Tx Team (Addendum)
Interdisciplinary Treatment and Diagnostic Plan Update  11/19/2017 Time of Session: 11am LEONDRE TAUL MRN: [id number]  Principal Diagnosis: Bipolar 1 disorder, depressed (HCC)  Secondary Diagnoses: Principal Problem:   Bipolar 1 disorder, depressed (HCC) Active Problems:   Opioid use disorder, moderate, dependence (HCC)   Cannabis use disorder, moderate, dependence (HCC)   Tobacco use disorder   Bipolar I disorder, most recent episode depressed, severe without psychotic features (HCC)   Current Medications:  Current Facility-Administered Medications  Medication Dose Route Frequency Provider Last Rate Last Dose  . acetaminophen (TYLENOL) tablet 650 mg  650 mg Oral Q6H PRN Clapacs, Jackquline Denmark, MD   650 mg at 11/18/17 2227  . alum & mag hydroxide-simeth (MAALOX/MYLANTA) 200-200-20 MG/5ML suspension 30 mL  30 mL Oral Q4H PRN Clapacs, John T, MD      . hydrOXYzine (ATARAX/VISTARIL) tablet 50 mg  50 mg Oral TID PRN Clapacs, Jackquline Denmark, MD   50 mg at 11/18/17 2227  . magnesium hydroxide (MILK OF MAGNESIA) suspension 30 mL  30 mL Oral Daily PRN Clapacs, John T, MD      . QUEtiapine (SEROQUEL) tablet 50 mg  50 mg Oral QHS McNew, Holly R, MD      . traZODone (DESYREL) tablet 100 mg  100 mg Oral QHS PRN Clapacs, Jackquline Denmark, MD   100 mg at 11/18/17 2227   PTA Medications: Medications Prior to Admission  Medication Sig Dispense Refill Last Dose  . cyclobenzaprine (FLEXERIL) 10 MG tablet Take 1 tablet (10 mg total) by mouth 3 (three) times daily as needed for muscle spasms. 30 tablet 0   . hydrOXYzine (ATARAX/VISTARIL) 50 MG tablet Take 1 tablet (50 mg total) by mouth 3 (three) times daily as needed for anxiety. 90 tablet 1   . ibuprofen (ADVIL,MOTRIN) 800 MG tablet Take 1 tablet (800 mg total) by mouth every 8 (eight) hours as needed. 30 tablet 0   . QUEtiapine (SEROQUEL) 50 MG tablet Take 1 tablet (50 mg total) by mouth at bedtime. 30 tablet 1   . silver sulfADIAZINE (SILVADENE) 1 % cream Apply to  affected area daily 50 g 1     Patient Stressors: Financial difficulties Health problems Marital or family conflict Substance abuse  Patient Strengths: Active sense of humor General fund of knowledge Motivation for treatment/growth  Treatment Modalities: Medication Management, Group therapy, Case management,  1 to 1 session with clinician, Psychoeducation, Recreational therapy.   Physician Treatment Plan for Primary Diagnosis: Bipolar 1 disorder, depressed (HCC) Long Term Goal(s):     Short Term Goals:    Medication Management: Evaluate patient's response, side effects, and tolerance of medication regimen.  Therapeutic Interventions: 1 to 1 sessions, Unit Group sessions and Medication administration.  Evaluation of Outcomes: Not Progressing  Physician Treatment Plan for Secondary Diagnosis: Principal Problem:   Bipolar 1 disorder, depressed (HCC) Active Problems:   Opioid use disorder, moderate, dependence (HCC)   Cannabis use disorder, moderate, dependence (HCC)   Tobacco use disorder   Bipolar I disorder, most recent episode depressed, severe without psychotic features (HCC)  Long Term Goal(s):     Short Term Goals:       Medication Management: Evaluate patient's response, side effects, and tolerance of medication regimen.  Therapeutic Interventions: 1 to 1 sessions, Unit Group sessions and Medication administration.  Evaluation of Outcomes: Not Progressing   RN Treatment Plan for Primary Diagnosis: Bipolar 1 disorder, depressed (HCC) Long Term Goal(s): Knowledge of disease and therapeutic regimen to maintain health will  improve  Short Term Goals: Ability to participate in decision making will improve, Ability to identify and develop effective coping behaviors will improve and Compliance with prescribed medications will improve  Medication Management: RN will administer medications as ordered by provider, will assess and evaluate patient's response and provide  education to patient for prescribed medication. RN will report any adverse and/or side effects to prescribing provider.  Therapeutic Interventions: 1 on 1 counseling sessions, Psychoeducation, Medication administration, Evaluate responses to treatment, Monitor vital signs and CBGs as ordered, Perform/monitor CIWA, COWS, AIMS and Fall Risk screenings as ordered, Perform wound care treatments as ordered.  Evaluation of Outcomes: Not Progressing   LCSW Treatment Plan for Primary Diagnosis: Bipolar 1 disorder, depressed (HCC) Long Term Goal(s): Safe transition to appropriate next level of care at discharge, Engage patient in therapeutic group addressing interpersonal concerns.  Short Term Goals: Engage patient in aftercare planning with referrals and resources, Increase social support, Increase ability to appropriately verbalize feelings, Facilitate patient progression through stages of change regarding substance use diagnoses and concerns, Identify triggers associated with mental health/substance abuse issues and Increase skills for wellness and recovery  Therapeutic Interventions: Assess for all discharge needs, 1 to 1 time with Social worker, Explore available resources and support systems, Assess for adequacy in community support network, Educate family and significant other(s) on suicide prevention, Complete Psychosocial Assessment, Interpersonal group therapy.  Evaluation of Outcomes: Not Progressing   Progress in Treatment: Attending groups: No. Participating in groups: No. Taking medication as prescribed: Yes. Toleration medication: Yes. Family/Significant other contact made: No, will contact:  Family if patient consents Patient understands diagnosis: Yes. Discussing patient identified problems/goals with staff: Yes. Medical problems stabilized or resolved: Yes. Denies suicidal/homicidal ideation: Yes. Issues/concerns per patient self-inventory: No. Other:   New problem(s)  identified: No, Describe:  None  New Short Term/Long Term Goal(s): Patient did not attend.  Patient Goals:  Patient did not attend.  Discharge Plan or Barriers: To discharge to a residential substance abuse treatment program.  Reason for Continuation of Hospitalization: Medication stabilization  Estimated Length of Stay: 3-5 days    Attendees: Patient:Patient did not attend. 11/19/2017 11:39 AM  Physician: Corinna Gab, [facility] 11/19/2017 11:39 AM  Nursing: Cecille Amsterdam RN 11/19/2017 11:39 AM  RN Care Manager: 11/19/2017 11:39 AM  Social Worker: Johny Shears, LCSWA 11/19/2017 11:39 AM  Recreational Therapist: Danella Deis. Dreama Saa, LRT 11/19/2017 11:39 AM  Other: Huey Romans, LCSW 11/19/2017 11:39 AM  Other:  11/19/2017 11:39 AM  Other: 11/19/2017 11:39 AM    Scribe for Treatment Team: Johny Shears, LCSW 11/19/2017 11:39 AM

## 2017-11-19 NOTE — Progress Notes (Signed)
LCSW met with patient and he is agreeable to go to [facility], he signed consent form to speak with his mother ,wife, future out patient referral to [facility] and to send his information to [facility] for treatment.  LCSW collected information to complete patient assessment and [facility] referral was faxed and will send information to [facility] once [facility] is provided. LCSW printed off face sheet, H&P and will put patient data in Epic.  [employer] LCSW [phone]

## 2017-11-19 NOTE — Progress Notes (Signed)
Recreation Therapy Notes  INPATIENT RECREATION THERAPY ASSESSMENT  Patient Details Name: IVA POSTEN MRN: [id number] DOB: 16-Aug-1990 Today's Date: 11/19/2017       Information Obtained From: Patient(Patient refused assesment)  Able to Participate in Assessment/Interview:    Patient Presentation:    Reason for Admission (Per Patient):    Patient Stressors:    Coping Skills:      Leisure Interests (2+):     Frequency of Recreation/Participation:    Awareness of Community Resources:     [employer]:     Current Use:    If no, Barriers?:    Expressed Interest in [employer] Information:    [place] of Residence:     Patient Main Form of Transportation:    Patient Strengths:     Patient Identified Areas of Improvement:     Patient Goal for Hospitalization:     Current SI (including self-harm):     Current HI:     Current AVH:    Staff Intervention Plan:    Consent to Intern Participation:    Kanae Ignatowski 11/19/2017, 1:14 PM
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
## 2017-11-19 NOTE — Progress Notes (Signed)
Recreation Therapy Notes  Date: 11/19/2017  Time: 9:30 am   Location: Craft room   Behavioral response: N/A   Intervention Topic: Creative Expressions  Discussion/Intervention: Patient did not attend group.   Clinical Observations/Feedback:  Patient did not attend group.   [name] LRT/CTRS        Manuel Richards 11/19/2017 11:31 AM 

## 2017-11-20 LAB — HEMOGLOBIN A1C
HEMOGLOBIN A1C: 5.6 % (ref 4.8–5.6)
MEAN PLASMA GLUCOSE: 114 mg/dL

## 2017-11-20 NOTE — Plan of Care (Signed)
Did not participate in activities but compliant with medications.

## 2017-11-20 NOTE — BHH Group Notes (Signed)
LCSW Group Therapy Note   11/20/2017 1:15pm   Type of Therapy and Topic:  Group Therapy:  Trust and Honesty  Participation Level:  Did Not Attend  Description of Group:    In this group patients will be asked to explore the value of being honest.  Patients will be guided to discuss their thoughts, feelings, and behaviors related to honesty and trusting in others. Patients will process together how trust and honesty relate to forming relationships with peers, family members, and self. Each patient will be challenged to identify and express feelings of being vulnerable. Patients will discuss reasons why people are dishonest and identify alternative outcomes if one was truthful (to self or others). This group will be process-oriented, with patients participating in exploration of their own experiences, giving and receiving support, and processing challenge from other group members.   Therapeutic Goals: 1. Patient will identify why honesty is important to relationships and how honesty overall affects relationships.  2. Patient will identify a situation where they lied or were lied too and the  feelings, thought process, and behaviors surrounding the situation 3. Patient will identify the meaning of being vulnerable, how that feels, and how that correlates to being honest with self and others. 4. Patient will identify situations where they could have told the truth, but instead lied and explain reasons of dishonesty.   Summary of Patient Progress:Pt was invited to attend group but chose not to attend. CSW will continue to encourage pt to attend group throughout their admission.     Therapeutic Modalities:   Cognitive Behavioral Therapy Solution Focused Therapy Motivational Interviewing Brief Therapy  Cheryl Chay  CUEBAS-COLON, LCSW 11/20/2017 11:58 AM  

## 2017-11-20 NOTE — Plan of Care (Signed)
Patient was in the day room for breakfast and lunch. Patient also went outside with the other patients on the unit. Patient was pleasant to me, didn't have any medications. Denied Si, Hi, AVH. Patient will continue to be monitored Q 15 minutes per unit protocol.    Problem: Education: Goal: Knowledge of [facility] Education information/materials will improve Outcome: Progressing Goal: Emotional status will improve Outcome: Progressing Goal: Mental status will improve Outcome: Progressing Goal: Verbalization of understanding the information provided will improve Outcome: Progressing   Problem: Activity: Goal: Interest or engagement in activities will improve Outcome: Progressing Goal: Sleeping patterns will improve Outcome: Progressing   Problem: Health Behavior/Discharge Planning: Goal: Identification of resources available to assist in meeting health care needs will improve Outcome: Progressing Goal: Compliance with treatment plan for underlying cause of condition will improve Outcome: Progressing   Problem: Coping: Goal: Coping ability will improve Outcome: Progressing   Problem: Health Behavior/Discharge Planning: Goal: Identification of resources available to assist in meeting health care needs will improve Outcome: Progressing   Problem: Self-Concept: Goal: Ability to disclose and discuss suicidal ideas will improve Outcome: Progressing Goal: Will verbalize positive feelings about self Outcome: Progressing

## 2017-11-20 NOTE — BHH Counselor (Signed)
Adult Comprehensive Assessment  Patient ID: Manuel Richards, male   DOB: [birth year], 27 y.o.   MRN: [id number]  Information Source: Information source: Patient  Current Stressors:  Patient states their primary concerns and needs for treatment are:: His addiction issues Patient states their goals for this hospitilization and ongoing recovery are:: To go to [facility]- Treatment Educational / Learning stressors: none Employment / Job issues: none Family Relationships: Clinical cytogeneticist / Lack of resources (include bankruptcy): [employer] / Lack of housing: He reports he is homeless Physical health (include injuries & life threatening diseases): none Social relationships: Strained Substance abuse: Heroin/Meth Bereavement / Loss: none  Living/Environment/Situation:  Living Arrangements: Other (Comment)(Homeless) Living conditions (as described by patient or guardian): na Who else lives in the home?: na How long has patient lived in current situation?: 2 weeks What is atmosphere in current home: Chaotic  Family History:  Marital status: Single Are you sexually active?: Yes What is your sexual orientation?: heterosexual Has your sexual activity been affected by drugs, alcohol, medication, or emotional stress?: no Does patient have children?: Yes How many children?: 2 How is patient's relationship with their children?: (Good- Miss them)  Childhood History:  By whom was/is the patient raised?: Mother, Grandparents Additional childhood history information: Father is in and out of jail. No relationship with him. pt lived with grandmother until age 10: mom had substance use issues.  Lived with mom after age 63. Description of patient's relationship with caregiver when they were a child: Close relationship with grandmother.   How were you disciplined when you got in trouble as a child/adolescent?: very little discipline.  Some spankings. Does patient have siblings?: Yes Number of  Siblings: 2 Description of patient's current relationship with siblings: 2 sisters, one step brother.  Some contact. Did patient suffer any verbal/emotional/physical/sexual abuse as a child?: No Did patient suffer from severe childhood neglect?: No Has patient ever been sexually abused/assaulted/raped as an adolescent or adult?: No Was the patient ever a victim of a crime or a disaster?: No Witnessed domestic violence?: No Has patient been effected by domestic violence as an adult?: No  Education:  Highest grade of school patient has completed: Grade 11 Currently a student?: No Learning disability?: No  Employment/Work Situation:   Employment situation: Employed Patient's job has been impacted by current illness: Yes Describe how patient's job has been impacted: unable to work What is the longest time patient has a held a job?: 7 months Where was the patient employed at that time?: [employer]  Did You Receive Any Psychiatric Treatment/Services While in the [employer]?: No Are There Guns or Other Weapons in Your Home?: No Are These Comptroller?: (no guns)  Financial Resources:      Alcohol/Substance Abuse:   What has been your use of drugs/alcohol within the last 12 months?: (I use heroin and Meth and Marajuanna) If attempted suicide, did drugs/alcohol play a role in this?: No Alcohol/Substance Abuse Treatment Hx: (No treatment) If yes, describe treatment: Free form Has alcohol/substance abuse ever caused legal problems?: No  Social Support System:   Forensic psychologist System: Poor Describe Community Support System: none Type of faith/religion: christian How does patient's faith help to cope with current illness?: not really  Leisure/Recreation:   Leisure and Hobbies: reading, writing   Strengths/Needs:   What is the patient's perception of their strengths?: Cant think of any Patient states they can use these personal strengths during their treatment  to contribute to their recovery: Im  willing to accept treatment now Patient states these barriers may affect/interfere with their treatment: his ability to stop using Patient states these barriers may affect their return to the community: no comment Other important information patient would like considered in planning for their treatment: Im willing to go  Discharge Plan:   Currently receiving community mental health services: No Patient states concerns and preferences for aftercare planning are: He will follow up with [facility] after residential treatment Patient states they will know when they are safe and ready for discharge when: In treatment program Does patient have access to transportation?: Yes(His Mom will take him to treatment) Does patient have financial barriers related to discharge medications?: No Patient description of barriers related to discharge medications: none Plan for living situation after discharge: (Not sure if his "wife"will take him back) Will patient be returning to same living situation after discharge?: No  Summary/Recommendations:   Summary and Recommendations (to be completed by the evaluator): LCSW introduced myself to patient. He is a 27 year old male with the diagnosis as follows Major Depressive disorder recurrent severe without psychosis-Substance use disorder. He report he has been homeless for the last 2 weeks. He is alert and oriented x4. He reports he has been abusing subtances since he was 89 and agrees he needs residential treatment. He has signed consents to go to [facility] and have follow up treatment out patient with [facility]. LCSW was given verbal and written permission to contact his mother and wife and facilities [facility]. LCSW completed [facility] referal and sent out  information to obtain an Auth number from [employer]. He will attend groups while in the hospital and take medications as recommended.   Manuel Richards M. 11/20/2017

## 2017-11-20 NOTE — Progress Notes (Signed)
Late submission: Completed patient assessment. Sent information to obtain auth number at 6 pm and [facility] did not have it ready yet. Faxed patient application with supporting documents to [facility] last night.  LCSW updated hand off chart and will consult with weekend LCSW to follow up to get auth and see about beds at [facility] for patient . SPE still needs to be done.  [employer] LCSW [phone]

## 2017-11-20 NOTE — Plan of Care (Signed)
Patient was seen in the dayroom today. Affect I improving but still flat and depressed. Able to express his feelings and needs. Denying thoughts of self harm . Denying hallucinations. Presented to the medication room and said that he has no concern. Received medication and was encouraged to talk to staff as needed. Emotional support provided. Safety and security maintained per unit protocol.

## 2017-11-20 NOTE — Progress Notes (Signed)
Patient went outside with the other patients on the unit.

## 2017-11-20 NOTE — Progress Notes (Addendum)
Patient isolative in room, reporting that he just needs to rest. Alert and oriented. Denying suicidal thoughts. Avoiding long conversations with staff. Did not participate in group activities . Was encouraged to call staff as needed. Currently in bed resting. Safety precautions maintained.

## 2017-11-20 NOTE — Progress Notes (Signed)
[facility] MD Progress Note  11/20/2017 6:19 PM KUSHAL SAUNDERS  MRN:  [id number] Subjective:  Pt seen and chart reviewed. Madilynn Montante was not engaging, answered most of my questions with one or two words.  Jonas Goh denied having acute withdrawal from heroin, said that Bernardina Cacho would be interested in substance abuse treatment and a half way house.  Dhriti Fales denied Si or HI.  Caidance Sybert reports eating and sleeping well.  No AVH.   Principal Problem: Major depressive disorder, recurrent episode with mixed features (HCC) Diagnosis:   Patient Active Problem List   Diagnosis Date Noted  . Major depressive disorder, recurrent episode with mixed features (HCC) [F33.9] 11/19/2017  . Sedative, hypnotic or anxiolytic use disorder, severe, dependence (HCC) [F13.20] 08/12/2016  . Hepatitis C [B19.20] 08/12/2016  . Cocaine use disorder, moderate, dependence (HCC) [F14.20] 08/11/2016  . 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 Opioid use disorder, moderate, dependence (HCC) [F11.20] 12/19/2014  . Cannabis use disorder, moderate, dependence (HCC) [F12.20] 12/19/2014  . Tobacco use disorder [F17.200] 12/19/2014   Total Time spent with patient: 15 minutes  Past Psychiatric History: History of hospitalization. Licia Harl has been admitted for psychosis and manic symptoms but was felt to be substance induced. Epifania Littrell does not have outpatient providers but has followed with [facility] in the past. Vanesha Athens is vague with reporting past suicide attempts. Jasir Rother states, "probably. There's a good chance I have attempted." Adonijah Baena states that Ernest Popowski has tried to overdose on heroin. Lichelle Viets has had a few inpatient admissions. Last one appears to have been in June 2018 at [facility]. Iyan Flett is not sure of any past medication trials. In 2017, Khayri Kargbo was hospitalized due to paranoia, AH and bizzare behaviors. It was noted that Fortino Haag was almost catatonic (UDS was only positive for marijuana at that time  Past Medical History:  Past Medical History:  Diagnosis Date  . ADHD  (attention deficit hyperactivity disorder)   . Anxiety    History reviewed. No pertinent surgical history. Family History:  Family History  Problem Relation Age of Onset  . Bipolar disorder Mother   . Schizophrenia Maternal Grandmother   . Schizophrenia Maternal Uncle    Family Psychiatric  History:  Social History:  Social History   Substance and Sexual Activity  Alcohol Use Yes     Social History   Substance and Sexual Activity  Drug Use Yes  . Types: Cocaine, Marijuana, Benzodiazepines, Opium    Social History   Socioeconomic History  . Marital status: Married    Spouse name: Not on file  . Number of children: 2  . Years of education: Not on file  . Highest education level: Not on file  Occupational History  . Occupation: n/a  Social Needs  . Financial resource strain: Very hard  . Food insecurity:    Worry: Often true    Inability: Often true  . Transportation needs:    Medical: Yes    Non-medical: Yes  Tobacco Use  . Smoking status: Current Every Day Smoker    Packs/day: 0.50    Types: Cigarettes  . Smokeless tobacco: Never Used  Substance and Sexual Activity  . Alcohol use: Yes  . Drug use: Yes    Types: Cocaine, Marijuana, Benzodiazepines, Opium  . Sexual activity: Yes    Birth control/protection: Condom  Lifestyle  . Physical activity:    Days per week: Not on file    Minutes per session: Not on file  . Stress: Not on file  Relationships  .  Social connections:    Talks on phone: Not on file    Gets together: Not on file    Attends religious service: Not on file    Active member of club or organization: Not on file    Attends meetings of clubs or organizations: Not on file    Relationship status: Not on file  Other Topics Concern  . Not on file  Social History Narrative  . Not on file   Additional Social History:    Pain Medications: See MAR Prescriptions: See MAR Over the Counter: See MAR History of alcohol / drug use?: Yes Longest  period of sobriety (when/how long): Unable to quantify  Sleep: Fair  Appetite:  Fair  Current Medications: Current Facility-Administered Medications  Medication Dose Route Frequency Provider Last Rate Last Dose  . acetaminophen (TYLENOL) tablet 650 mg  650 mg Oral Q6H PRN Clapacs, Jackquline Denmark, MD   650 mg at 11/18/17 2227  . alum & mag hydroxide-simeth (MAALOX/MYLANTA) 200-200-20 MG/5ML suspension 30 mL  30 mL Oral Q4H PRN Clapacs, John T, MD      . hydrOXYzine (ATARAX/VISTARIL) tablet 50 mg  50 mg Oral TID PRN Clapacs, Jackquline Denmark, MD   50 mg at 11/18/17 2227  . magnesium hydroxide (MILK OF MAGNESIA) suspension 30 mL  30 mL Oral Daily PRN Clapacs, John T, MD      . QUEtiapine (SEROQUEL) tablet 50 mg  50 mg Oral QHS McNew, Ileene Hutchinson, MD   50 mg at 11/19/17 2214  . traZODone (DESYREL) tablet 100 mg  100 mg Oral QHS PRN Clapacs, Jackquline Denmark, MD   100 mg at 11/18/17 2227    Lab Results:  Results for orders placed or performed during the hospital encounter of 11/18/17 (from the past 48 hour(s))  Hemoglobin A1c     Status: None   Collection Time: 11/19/17  7:27 AM  Result Value Ref Range   Hgb A1c MFr Bld 5.6 4.8 - 5.6 %    Comment: (NOTE)         Prediabetes: 5.7 - 6.4         Diabetes: >6.4         Glycemic control for adults with diabetes: <7.0    Mean Plasma Glucose 114 mg/dL    Comment: (NOTE) Performed At: [facility] [address] [place], [place] [id number] Jolene Schimke MD NW:[id number]   Lipid panel     Status: Abnormal   Collection Time: 11/19/17  7:27 AM  Result Value Ref Range   Cholesterol 157 0 - 200 mg/dL   Triglycerides 76 <578 mg/dL   HDL 40 (L) >46 mg/dL   Total CHOL/HDL Ratio 3.9 RATIO   VLDL 15 0 - 40 mg/dL   LDL Cholesterol 962 (H) 0 - 99 mg/dL    Comment:        Total Cholesterol/HDL:CHD Risk Coronary Heart Disease Risk Table                     Men   Women  1/2 Average Risk   3.4   3.3  Average Risk       5.0   4.4  2 X Average Risk   9.6   7.1  3 X  Average Risk  23.4   11.0        Use the calculated Patient Ratio above and the CHD Risk Table to determine the patient's CHD Risk.        ATP III CLASSIFICATION (  LDL):  <100     mg/dL   Optimal  161-096  mg/dL   Near or Above                    Optimal  130-159  mg/dL   Borderline  045-409  mg/dL   High  >811     mg/dL   Very High Performed at [facility], [address] [place], [place] [address]   TSH     Status: Abnormal   Collection Time: 11/19/17  7:27 AM  Result Value Ref Range   TSH 0.147 (L) 0.350 - 4.500 uIU/mL    Comment: Performed by a 3rd Generation assay with a functional sensitivity of <=0.01 uIU/mL. Performed at [facility], [address] [place], [place] [address]     Blood Alcohol level:  Lab Results  Component Value Date   [facility] <10 11/18/2017   ETH <5 08/11/2016    Metabolic Disorder Labs: Lab Results  Component Value Date   HGBA1C 5.6 11/19/2017   MPG 114 11/19/2017   MPG 108 08/11/2016   Lab Results  Component Value Date   PROLACTIN 31.6 (H) 09/06/2015   Lab Results  Component Value Date   CHOL 157 11/19/2017   TRIG 76 11/19/2017   HDL 40 (L) 11/19/2017   CHOLHDL 3.9 11/19/2017   VLDL 15 11/19/2017   LDLCALC 102 (H) 11/19/2017   LDLCALC 98 08/11/2016    Physical Findings: AIMS: Facial and Oral Movements Muscles of Facial Expression: None, normal Lips and Perioral Area: None, normal Jaw: None, normal Tongue: None, normal,Extremity Movements Upper (arms, wrists, hands, fingers): None, normal Lower (legs, knees, ankles, toes): None, normal, Trunk Movements Neck, shoulders, hips: None, normal, Overall Severity Severity of abnormal movements (highest score from questions above): None, normal Incapacitation due to abnormal movements: None, normal Patient's awareness of abnormal movements (rate only patient's report): No Awareness, Dental Status Current problems with teeth and/or dentures?: No Does patient usually  wear dentures?: No  CIWA:  CIWA-Ar Total: 0 COWS:  COWS Total Score: 0  Musculoskeletal: Strength & Muscle Tone: within normal limits Gait & Station: normal Patient leans: N/A  Psychiatric Specialty Exam: Physical Exam  ROS  Blood pressure (!) 134/95, pulse 93, temperature 98.8 F (37.1 C), temperature source Oral, resp. rate 16, height 5\' 4"  (1.626 m), weight 49.8 kg, SpO2 100 %.Body mass index is 18.85 kg/m.  General Appearance: Casual  Eye Contact:  Minimal  Speech:  Clear and Coherent  Volume:  Normal  Mood:  Depressed  Affect:  Blunt and Congruent  Thought Process:  Goal Directed  Orientation:  Full (Time, Place, and Person)  Thought Content:  Logical  Suicidal Thoughts:  No  Homicidal Thoughts:  No  Memory:  Immediate;   Fair Recent;   Fair Remote;   Fair  Judgement:  Intact  Insight:  Fair  Psychomotor Activity:  Normal  Concentration:  Concentration: Fair and Attention Span: Fair  Recall:  [employer] of Knowledge:  Fair  Language:  Fair        Assets:  Physical Health Resilience  ADL's:  Intact  Cognition:  WNL  Sleep:  Number of Hours: 7.5     Treatment Plan Summary: Daily contact with patient to assess and evaluate symptoms and progress in treatment and Medication management   27 yo male admitted due to voicing SI and polysubstance abuse. Diagnosis is unclear as Janisa Labus has been using drugs consistently for several years with  no periods of sobriety. Azara Gemme has had hospitalizations for psychotic and manic symptoms. There is some documentation that those symptosm were thought to be purely substance induced. However, Hadlei Stitt did have a hospitalization with psychosis where UDS was only positive for cannabis (however Matheson Vandehei does have history of acid and LSD use,as well which Anelis Hrivnak could have been using at that time which would not show up on UDS). Rakin Lemelle is very vague with any symptom history so it is difficult to ascertain a true diagnosis at this time. Rondalyn Belford is organized int thoughts and  does not appear manic or psychotic currently. Kerrianne Jeng does desire to be sober and go to residential treatment at this time.   Plan:  Mood disorder -continue Seroquel 50 mg qhs and can titrate up as needed and tolerated.  -QTc 472  Polysubstance use disorder-heroin, methamphetamine, cannabis-IVDA -Laniece Hornbaker might be a candidate for MAT (suboxone or methadone).  -referral to [facility]  Hepatitis c -has not received treatment for this -LFTs slightly elevated  Dispo -[facility] referral   Catarino Vold, MD 11/20/2017, 6:19 PM

## 2017-11-21 NOTE — [facility] Group Notes (Signed)
[facility] Group Notes:  (Nursing/MHT/Case Management/Adjunct)  Date:  11/21/2017  Time:  10:10 PM  Type of Therapy:  Group Therapy  Participation Level:  Did Not Attend   Jinger Neighbors 11/21/2017, 10:10 PM

## 2017-11-21 NOTE — Progress Notes (Signed)
D- Patient alert and oriented. Patient presents in a pleasant mood on assessment stating that he slept alright last night and had no major complaints or concerns to voice to this Clinical research associate. Patient rates his depression a "5/10" and his anxiety a "7/10" stating that "not being able to leave" and "not knowing what's going to happen when I leave here " is making him feel like this. Patient denies SI, HI, AVH, and pain at this time. Patient had no stated goals for today.  A- Support and encouragement provided. Routine safety checks conducted every 15 minutes.  Patient informed to notify staff with problems or concerns.  R- Patient contracts for safety. Patient compliant with medications and treatment plan. Patient receptive, calm, and cooperative. Patient interacts well with others on the unit.  Patient remains safe at this time.

## 2017-11-21 NOTE — Progress Notes (Signed)
[facility] MD Progress Note  11/21/2017 2:37 PM Elizjah Noblet  MRN:  [id number] Subjective:  Pt seen and chart reviewed. Arden Axon  Is more engaging today and said that Cheyne Bungert wants get substance abuse treatment.  Warda Mcqueary prefers residential as Brette Cast is also homeless.   Gwenetta Devos said that Orvella Digiulio would be interested in suboxone treatment if Luisalberto Beegle can find the resources.   No SI.   Principal Problem: Major depressive disorder, recurrent episode with mixed features (HCC) Diagnosis:   Patient Active Problem List   Diagnosis Date Noted  . Major depressive disorder, recurrent episode with mixed features (HCC) [F33.9] 11/19/2017  . Sedative, hypnotic or anxiolytic use disorder, severe, dependence (HCC) [F13.20] 08/12/2016  . Hepatitis C [B19.20] 08/12/2016  . Cocaine use disorder, moderate, dependence (HCC) [F14.20] 08/11/2016  . 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 Opioid use disorder, moderate, dependence (HCC) [F11.20] 12/19/2014  . Cannabis use disorder, moderate, dependence (HCC) [F12.20] 12/19/2014  . Tobacco use disorder [F17.200] 12/19/2014   Total Time spent with patient: 15 minutes  Past Psychiatric History: History of hospitalization. Shontay Wallner has been admitted for psychosis and manic symptoms but was felt to be substance induced. Grady Mohabir does not have outpatient providers but has followed with [facility] in the past. Javoni Lucken is vague with reporting past suicide attempts. Keshauna Degraffenreid states, "probably. There's a good chance I have attempted." Codee Bloodworth states that Tanny Harnack has tried to overdose on heroin. Finas Delone has had a few inpatient admissions. Last one appears to have been in June 2018 at [facility]. Emileigh Kellett is not sure of any past medication trials. In 2017, Mahesh Sizemore was hospitalized due to paranoia, AH and bizzare behaviors. It was noted that Vittorio Mohs was almost catatonic (UDS was only positive for marijuana at that time  Past Medical History:  Past Medical History:  Diagnosis Date  . ADHD (attention deficit hyperactivity disorder)    . Anxiety    History reviewed. No pertinent surgical history. Family History:  Family History  Problem Relation Age of Onset  . Bipolar disorder Mother   . Schizophrenia Maternal Grandmother   . Schizophrenia Maternal Uncle    Family Psychiatric  History:  Social History:  Social History   Substance and Sexual Activity  Alcohol Use Yes     Social History   Substance and Sexual Activity  Drug Use Yes  . Types: Cocaine, Marijuana, Benzodiazepines, Opium    Social History   Socioeconomic History  . Marital status: Married    Spouse name: Not on file  . Number of children: 2  . Years of education: Not on file  . Highest education level: Not on file  Occupational History  . Occupation: n/a  Social Needs  . Financial resource strain: Very hard  . Food insecurity:    Worry: Often true    Inability: Often true  . Transportation needs:    Medical: Yes    Non-medical: Yes  Tobacco Use  . Smoking status: Current Every Day Smoker    Packs/day: 0.50    Types: Cigarettes  . Smokeless tobacco: Never Used  Substance and Sexual Activity  . Alcohol use: Yes  . Drug use: Yes    Types: Cocaine, Marijuana, Benzodiazepines, Opium  . Sexual activity: Yes    Birth control/protection: Condom  Lifestyle  . Physical activity:    Days per week: Not on file    Minutes per session: Not on file  . Stress: Not on file  Relationships  . Social connections:    Talks  on phone: Not on file    Gets together: Not on file    Attends religious service: Not on file    Active member of club or organization: Not on file    Attends meetings of clubs or organizations: Not on file    Relationship status: Not on file  Other Topics Concern  . Not on file  Social History Narrative  . Not on file   Additional Social History:    Pain Medications: See MAR Prescriptions: See MAR Over the Counter: See MAR History of alcohol / drug use?: Yes Longest period of sobriety (when/how long): Unable to  quantify  Sleep: Fair  Appetite:  Fair  Current Medications: Current Facility-Administered Medications  Medication Dose Route Frequency Provider Last Rate Last Dose  . acetaminophen (TYLENOL) tablet 650 mg  650 mg Oral Q6H PRN Clapacs, Jackquline Denmark, MD   650 mg at 11/18/17 2227  . alum & mag hydroxide-simeth (MAALOX/MYLANTA) 200-200-20 MG/5ML suspension 30 mL  30 mL Oral Q4H PRN Clapacs, John T, MD      . hydrOXYzine (ATARAX/VISTARIL) tablet 50 mg  50 mg Oral TID PRN Clapacs, Jackquline Denmark, MD   50 mg at 11/18/17 2227  . magnesium hydroxide (MILK OF MAGNESIA) suspension 30 mL  30 mL Oral Daily PRN Clapacs, John T, MD      . QUEtiapine (SEROQUEL) tablet 50 mg  50 mg Oral QHS McNew, Ileene Hutchinson, MD   50 mg at 11/20/17 2143  . traZODone (DESYREL) tablet 100 mg  100 mg Oral QHS PRN Clapacs, Jackquline Denmark, MD   100 mg at 11/18/17 2227    Lab Results:  No results found for this or any previous visit (from the past 48 hour(s)).  Blood Alcohol level:  Lab Results  Component Value Date   ETH <10 11/18/2017   ETH <5 08/11/2016    Metabolic Disorder Labs: Lab Results  Component Value Date   HGBA1C 5.6 11/19/2017   MPG 114 11/19/2017   MPG 108 08/11/2016   Lab Results  Component Value Date   PROLACTIN 31.6 (H) 09/06/2015   Lab Results  Component Value Date   CHOL 157 11/19/2017   TRIG 76 11/19/2017   HDL 40 (L) 11/19/2017   CHOLHDL 3.9 11/19/2017   VLDL 15 11/19/2017   LDLCALC 102 (H) 11/19/2017   LDLCALC 98 08/11/2016    Physical Findings: AIMS: Facial and Oral Movements Muscles of Facial Expression: None, normal Lips and Perioral Area: None, normal Jaw: None, normal Tongue: None, normal,Extremity Movements Upper (arms, wrists, hands, fingers): None, normal Lower (legs, knees, ankles, toes): None, normal, Trunk Movements Neck, shoulders, hips: None, normal, Overall Severity Severity of abnormal movements (highest score from questions above): None, normal Incapacitation due to abnormal  movements: None, normal Patient's awareness of abnormal movements (rate only patient's report): No Awareness, Dental Status Current problems with teeth and/or dentures?: No Does patient usually wear dentures?: No  CIWA:  CIWA-Ar Total: 0 COWS:  COWS Total Score: 0  Musculoskeletal: Strength & Muscle Tone: within normal limits Gait & Station: normal Patient leans: N/A  Psychiatric Specialty Exam: Physical Exam   ROS   Blood pressure (!) 120/95, pulse (!) 104, temperature 98.7 F (37.1 C), temperature source Oral, resp. rate 17, height 5\' 4"  (1.626 m), weight 49.8 kg, SpO2 99 %.Body mass index is 18.85 kg/m.  General Appearance: Casual  Eye Contact:  Minimal  Speech:  Clear and Coherent  Volume:  Normal  Mood:  Depressed  Affect:  Blunt and Congruent  Thought Process:  Goal Directed  Orientation:  Full (Time, Place, and Person)  Thought Content:  Logical  Suicidal Thoughts:  No  Homicidal Thoughts:  No  Memory:  Immediate;   Fair Recent;   Fair Remote;   Fair  Judgement:  Intact  Insight:  Fair  Psychomotor Activity:  Normal  Concentration:  Concentration: Fair and Attention Span: Fair  Recall:  [employer] of Knowledge:  Fair  Language:  Fair        Assets:  Physical Health Resilience  ADL's:  Intact  Cognition:  WNL  Sleep:  Number of Hours: 7.5     Treatment Plan Summary: Daily contact with patient to assess and evaluate symptoms and progress in treatment and Medication management   27 yo male admitted due to voicing SI and polysubstance abuse. Diagnosis is unclear as Kashara Blocher has been using drugs consistently for several years with no periods of sobriety. Dougles Kimmey has had hospitalizations for psychotic and manic symptoms. There is some documentation that those symptosm were thought to be purely substance induced. However, Cohen Doleman did have a hospitalization with psychosis where UDS was only positive for cannabis (however Aldina Porta does have history of acid and LSD use,as well which Thom Ollinger  could have been using at that time which would not show up on UDS). Emmalena Canny is very vague with any symptom history so it is difficult to ascertain a true diagnosis at this time. Nichlos Kunzler is organized int thoughts and does not appear manic or psychotic currently. Marcy Sookdeo does desire to be sober and go to residential treatment at this time.   Plan:  Mood disorder -continue Seroquel 50 mg qhs and can titrate up as needed and tolerated.  -QTc 472  Polysubstance use disorder-heroin, methamphetamine, cannabis-IVDA -Keigo Whalley is a candidate for MAT, Deondrae Mcgrail said that Payeton Germani likes to try suboxone if Keena Dinse has the resources to continues outside the hospital. Will defer it to [facility].   -referral to [facility]  Hepatitis c -has not received treatment for this -LFTs slightly elevated  Dispo -[facility] referral   Tynan Boesel, MD 11/21/2017, 2:37 PM

## 2017-11-21 NOTE — [facility] Group Notes (Signed)
LCSW Group Therapy Note 11/21/2017 1:15pm  Type of Therapy and Topic: Group Therapy: Feelings Around Returning Home & Establishing a Supportive Framework and Supporting Oneself When Supports Not Available  Participation Level: Did Not Attend  Description of Group:  Patients first processed thoughts and feelings about upcoming discharge. These included fears of upcoming changes, lack of change, new living environments, judgements and expectations from others and overall stigma of mental health issues. The group then discussed the definition of a supportive framework, what that looks and feels like, and how do to discern it from an unhealthy non-supportive network. The group identified different types of supports as well as what to do when your family/friends are less than helpful or unavailable  Therapeutic Goals  1. Patient will identify one healthy supportive network that they can use at discharge. 2. Patient will identify one factor of a supportive framework and how to tell it from an unhealthy network. 3. Patient able to identify one coping skill to use when they do not have positive supports from others. 4. Patient will demonstrate ability to communicate their needs through discussion and/or role plays.  Summary of Patient Progress:  Pt was invited to attend group but chose not to attend. CSW will continue to encourage pt to attend group throughout their admission.    Therapeutic Modalities Cognitive Behavioral Therapy Motivational Interviewing   Kwan Shellhammer  CUEBAS-COLON, LCSW 11/21/2017 11:40 AM

## 2017-11-21 NOTE — Plan of Care (Signed)
Patient verbalized understanding of the general information that's been provided to him and he has not voiced any further questions or concerns at this time. Patient denied SI/HI/AVH at the time of assessment. Patient rated his depression a "5/10" and his anxiety a "7/10" stating that "not being able to leave here" and "not knowing what's going to happen when I leave here" is why he feeling this way. Patient has been seen out in the milieu multiple times throughout the day and then goes back to his room. Patient has the ability to identify the available resources that can assist him in meeting his health-care needs, however, he has not voiced anything to this Clinical research associate as of yet. Patient has been in compliance with his prescribed therapeutic regimen thus far. Patient had no stated goals for today and remains safe on the unit at this time.  Problem: Education: Goal: Knowledge of [facility] Education information/materials will improve Outcome: Progressing Goal: Emotional status will improve Outcome: Progressing Goal: Mental status will improve Outcome: Progressing Goal: Verbalization of understanding the information provided will improve Outcome: Progressing   Problem: Activity: Goal: Interest or engagement in activities will improve Outcome: Progressing Goal: Sleeping patterns will improve Outcome: Progressing   Problem: Health Behavior/Discharge Planning: Goal: Identification of resources available to assist in meeting health care needs will improve Outcome: Progressing Goal: Compliance with treatment plan for underlying cause of condition will improve Outcome: Progressing   Problem: Coping: Goal: Coping ability will improve Outcome: Progressing   Problem: Health Behavior/Discharge Planning: Goal: Identification of resources available to assist in meeting health care needs will improve Outcome: Progressing   Problem: Self-Concept: Goal: Ability to disclose and discuss suicidal  ideas will improve Outcome: Progressing Goal: Will verbalize positive feelings about self Outcome: Progressing

## 2017-11-22 MED ORDER — ENSURE ENLIVE PO LIQD
237.0000 mL | Freq: Two times a day (BID) | ORAL | Status: DC
  Administered 2017-11-22 (×2): 237 mL via ORAL

## 2017-11-22 NOTE — [facility] Group Notes (Signed)
[facility] Group Notes:  (Nursing/MHT/Case Management/Adjunct)  Date:  11/22/2017  Time:  10:43 PM  Type of Therapy:  Group Therapy  Participation Level:  Did Not Attend   Jinger Neighbors 11/22/2017, 10:43 PM

## 2017-11-22 NOTE — Plan of Care (Signed)
D: Pt denies SI/HI/AV hallucinations. Pt is pleasant and cooperative. Pt has been observed in milieu some today interacting with peers.  A: Pt was offered support and encouragement. Pt was given scheduled medications. Pt was encourage to attend groups. Q 15 minute checks were done for safety.  R:Pt attends groups and interacts well with peers and staff. Pt is taking medication. Pt has no complaints.Pt receptive to treatment and safety maintained on unit.   Problem: Education: Goal: Knowledge of [facility] Education information/materials will improve Outcome: Progressing Goal: Emotional status will improve Outcome: Progressing Goal: Mental status will improve Outcome: Progressing Goal: Verbalization of understanding the information provided will improve Outcome: Progressing   Problem: Activity: Goal: Interest or engagement in activities will improve Outcome: Progressing Goal: Sleeping patterns will improve Outcome: Progressing   Problem: Health Behavior/Discharge Planning: Goal: Identification of resources available to assist in meeting health care needs will improve Outcome: Progressing Goal: Compliance with treatment plan for underlying cause of condition will improve Outcome: Progressing   Problem: Coping: Goal: Coping ability will improve Outcome: Progressing   Problem: Health Behavior/Discharge Planning: Goal: Identification of resources available to assist in meeting health care needs will improve Outcome: Progressing   Problem: Self-Concept: Goal: Ability to disclose and discuss suicidal ideas will improve Outcome: Progressing Goal: Will verbalize positive feelings about self Outcome: Progressing

## 2017-11-22 NOTE — Progress Notes (Signed)
[facility] MD Progress Note  11/22/2017 1:40 PM Manuel Richards  MRN:  [id number] Subjective:  Pt states that he is doing fine. He is sleeping okay. He still wants to pursue [facility] for treatment. He denies SI or thoughts of self harm. He states taht he has big appetite and is not getting enough to eat. He is more willing to talk today and less isolative to his room. He is sleeping fine. He was encouraged to attend groups. He is organized and goal directed. HE states that he spoke with his wife and kids. He wants to get sober for them.   Principal Problem: Major depressive disorder, recurrent episode with mixed features (HCC) Diagnosis:   Patient Active Problem List   Diagnosis Date Noted  . Major depressive disorder, recurrent episode with mixed features (HCC) [F33.9] 11/19/2017    Priority: High  . Substance-induced psychotic disorder with hallucinations [facility]) [F19.951] 12/19/2014    Priority: High  . Opioid use disorder, moderate, dependence (HCC) [F11.20] 12/19/2014    Priority: High  . Cannabis use disorder, moderate, dependence (HCC) [F12.20] 12/19/2014    Priority: High  . Sedative, hypnotic or anxiolytic use disorder, severe, dependence (HCC) [F13.20] 08/12/2016  . Hepatitis C [B19.20] 08/12/2016  . Cocaine use disorder, moderate, dependence (HCC) [F14.20] 08/11/2016  . Tobacco use disorder [F17.200] 12/19/2014   Total Time spent with patient: 20 minutes  Past Psychiatric History: See h&p  Past Medical History:  Past Medical History:  Diagnosis Date  . ADHD (attention deficit hyperactivity disorder)   . Anxiety    History reviewed. No pertinent surgical history. Family History:  Family History  Problem Relation Age of Onset  . Bipolar disorder Mother   . Schizophrenia Maternal Grandmother   . Schizophrenia Maternal Uncle    Family Psychiatric  History: See H&P Social History:  Social History   Substance and Sexual Activity  Alcohol Use Yes     Social History    Substance and Sexual Activity  Drug Use Yes  . Types: Cocaine, Marijuana, Benzodiazepines, Opium    Social History   Socioeconomic History  . Marital status: Married    Spouse name: Not on file  . Number of children: 2  . Years of education: Not on file  . Highest education level: Not on file  Occupational History  . Occupation: n/a  Social Needs  . Financial resource strain: Very hard  . Food insecurity:    Worry: Often true    Inability: Often true  . Transportation needs:    Medical: Yes    Non-medical: Yes  Tobacco Use  . Smoking status: Current Every Day Smoker    Packs/day: 0.50    Types: Cigarettes  . Smokeless tobacco: Never Used  Substance and Sexual Activity  . Alcohol use: Yes  . Drug use: Yes    Types: Cocaine, Marijuana, Benzodiazepines, Opium  . Sexual activity: Yes    Birth control/protection: Condom  Lifestyle  . Physical activity:    Days per week: Not on file    Minutes per session: Not on file  . Stress: Not on file  Relationships  . Social connections:    Talks on phone: Not on file    Gets together: Not on file    Attends religious service: Not on file    Active member of club or organization: Not on file    Attends meetings of clubs or organizations: Not on file    Relationship status: Not on file  Other Topics Concern  .  Not on file  Social History Narrative  . Not on file   Additional Social History:    Pain Medications: See MAR Prescriptions: See MAR Over the Counter: See MAR History of alcohol / drug use?: Yes Longest period of sobriety (when/how long): Unable to quantify                    Sleep: Fair  Appetite:  Good  Current Medications: Current Facility-Administered Medications  Medication Dose Route Frequency Provider Last Rate Last Dose  . acetaminophen (TYLENOL) tablet 650 mg  650 mg Oral Q6H PRN Clapacs, Jackquline Denmark, MD   650 mg at 11/18/17 2227  . alum & mag hydroxide-simeth (MAALOX/MYLANTA) 200-200-20  MG/5ML suspension 30 mL  30 mL Oral Q4H PRN Clapacs, John T, MD      . feeding supplement (ENSURE ENLIVE) (ENSURE ENLIVE) liquid 237 mL  237 mL Oral BID BM Rhen Kawecki R, MD   237 mL at 11/22/17 1000  . hydrOXYzine (ATARAX/VISTARIL) tablet 50 mg  50 mg Oral TID PRN Clapacs, Jackquline Denmark, MD   50 mg at 11/18/17 2227  . magnesium hydroxide (MILK OF MAGNESIA) suspension 30 mL  30 mL Oral Daily PRN Clapacs, John T, MD      . QUEtiapine (SEROQUEL) tablet 50 mg  50 mg Oral QHS Parker Wherley, Ileene Hutchinson, MD   50 mg at 11/21/17 2001  . traZODone (DESYREL) tablet 100 mg  100 mg Oral QHS PRN Clapacs, Jackquline Denmark, MD   100 mg at 11/21/17 2001    Lab Results: No results found for this or any previous visit (from the past 48 hour(s)).  Blood Alcohol level:  Lab Results  Component Value Date   ETH <10 11/18/2017   ETH <5 08/11/2016    Metabolic Disorder Labs: Lab Results  Component Value Date   HGBA1C 5.6 11/19/2017   MPG 114 11/19/2017   MPG 108 08/11/2016   Lab Results  Component Value Date   PROLACTIN 31.6 (H) 09/06/2015   Lab Results  Component Value Date   CHOL 157 11/19/2017   TRIG 76 11/19/2017   HDL 40 (L) 11/19/2017   CHOLHDL 3.9 11/19/2017   VLDL 15 11/19/2017   LDLCALC 102 (H) 11/19/2017   LDLCALC 98 08/11/2016    Physical Findings: AIMS: Facial and Oral Movements Muscles of Facial Expression: None, normal Lips and Perioral Area: None, normal Jaw: None, normal Tongue: None, normal,Extremity Movements Upper (arms, wrists, hands, fingers): None, normal Lower (legs, knees, ankles, toes): None, normal, Trunk Movements Neck, shoulders, hips: None, normal, Overall Severity Severity of abnormal movements (highest score from questions above): None, normal Incapacitation due to abnormal movements: None, normal Patient's awareness of abnormal movements (rate only patient's report): No Awareness, Dental Status Current problems with teeth and/or dentures?: No Does patient usually wear dentures?: No   CIWA:  CIWA-Ar Total: 0 COWS:  COWS Total Score: 0  Musculoskeletal: Strength & Muscle Tone: within normal limits Gait & Station: normal Patient leans: N/A  Psychiatric Specialty Exam: Physical Exam  Nursing note and vitals reviewed.   Review of Systems  All other systems reviewed and are negative.   Blood pressure (!) 122/92, pulse 82, temperature 97.7 F (36.5 C), temperature source Oral, resp. rate 16, height 5\' 4"  (1.626 m), weight 49.8 kg, SpO2 100 %.Body mass index is 18.85 kg/m.  General Appearance: Disheveled  Eye Contact:  Fair  Speech:  Clear and Coherent  Volume:  Normal  Mood:  Euthymic  Affect:  Appropriate  Thought Process:  Coherent and Goal Directed  Orientation:  Full (Time, Place, and Person)  Thought Content:  Logical  Suicidal Thoughts:  No  Homicidal Thoughts:  No  Memory:  Immediate;   Fair  Judgement:  Fair  Insight:  Fair  Psychomotor Activity:  Normal  Concentration:  Concentration: Fair  Recall:  [employer] of Knowledge:  Fair  Language:  Fair  Akathisia:  No      Assets:  Resilience  ADL's:  Intact  Cognition:  WNL  Sleep:  Number of Hours: 7.45     Treatment Plan Summary: 27 yo male admitted due to SI and polysubstance abuse. HE was referred to [facility] and awaiting a response. He is less isolative.   Plan:  Mood disorder -Continue Seroquel 50 mg qhs  polysubstance abuse -Referred to [facility]. Awaiting response  Dispo -[facility] referral. He states that if this does not work out he has some friends he can stay with.He is unable to go to our shelter as he has no ID Haskell Riling, MD 11/22/2017, 1:40 PM

## 2017-11-22 NOTE — Progress Notes (Signed)
Recreation Therapy Notes  Date: 11/22/2017  Time: 9:30 am   Location: Craft room   Behavioral response: N/A   Intervention Topic: Self-esteem  Discussion/Intervention: Patient did not attend group.   Clinical Observations/Feedback:  Patient did not attend group.   [name] LRT/CTRS        Manuel Richards 11/22/2017 11:34 AM 

## 2017-11-22 NOTE — Progress Notes (Signed)
Patient ID: Manuel Richards, male   DOB: 1990-08-30, 27 y.o.   MRN: [id number] DAR Note: Pt observed on the hallway not interacting. Pt at assessment endorsed moderate anxiety and depression, "I'm getting there." Pt denied depression, anxiety, pain, SI/HI, AVH. Support, encouragement, and safe environment provided. All Pt questions and concerns addressed. Pt was med compliant. Will continue to monitor for safety.

## 2017-11-23 MED ORDER — QUETIAPINE FUMARATE 50 MG PO TABS: 50.0000 mg | ORAL_TABLET | Freq: Every day | ORAL | 1 refills | Status: DC

## 2017-11-23 NOTE — [facility] Group Notes (Signed)
LCSW Group Therapy Note   11/22/2017 1:00pm   Type of Therapy and Topic:  Group Therapy:  Overcoming Obstacles   Participation Level:  Came to join group at about the last 10 minutes   Description of Group:    In this group patients will be encouraged to explore what they see as obstacles to their own wellness and recovery. They will be guided to discuss their thoughts, feelings, and behaviors related to these obstacles. The group will process together ways to cope with barriers, with attention given to specific choices patients can make. Each patient will be challenged to identify changes they are motivated to make in order to overcome their obstacles. This group will be process-oriented, with patients participating in exploration of their own experiences as well as giving and receiving support and challenge from other group members.   Therapeutic Goals: 1. Patient will identify personal and current obstacles as they relate to admission. 2. Patient will identify barriers that currently interfere with their wellness or overcoming obstacles.  3. Patient will identify feelings, thought process and behaviors related to these barriers. 4. Patient will identify two changes they are willing to make to overcome these obstacles:      Summary of Patient Progress Pt appeared attentive, but did not contribute to group discussion.     Therapeutic Modalities:   Cognitive Behavioral Therapy Solution Focused Therapy Motivational Interviewing Relapse Prevention Therapy  Glennon Mac, LCSW 11/23/2017 1:07 PM

## 2017-11-23 NOTE — Progress Notes (Signed)
[facility] MD Progress Note  11/23/2017 1:54 PM Manuel Richards  MRN:  [id number] Subjective: Pt states that he is feeling fine. He is getting very antsy waiting for [facility] to let him know if he has a bed. He denies SI or thoughts of self harm. He is sleeping fine. He is eating well. He is organized in thoughts. He later came to my office stating that he does not want to wait on [facility] referral anymore. He would rather go to [facility] and look for a much longer residential program. He states that he has friends that he will stay with in the meantime.   Principal Problem: Major depressive disorder, recurrent episode with mixed features (HCC) Diagnosis:   Patient Active Problem List   Diagnosis Date Noted  . Major depressive disorder, recurrent episode with mixed features (HCC) [F33.9] 11/19/2017    Priority: High  . Substance-induced psychotic disorder with hallucinations [facility]) [F19.951] 12/19/2014    Priority: High  . Opioid use disorder, moderate, dependence (HCC) [F11.20] 12/19/2014    Priority: High  . Cannabis use disorder, moderate, dependence (HCC) [F12.20] 12/19/2014    Priority: High  . Sedative, hypnotic or anxiolytic use disorder, severe, dependence (HCC) [F13.20] 08/12/2016  . Hepatitis C [B19.20] 08/12/2016  . Cocaine use disorder, moderate, dependence (HCC) [F14.20] 08/11/2016  . Tobacco use disorder [F17.200] 12/19/2014   Total Time spent with patient: 20 minutes  Past Psychiatric History: See H&p  Past Medical History:  Past Medical History:  Diagnosis Date  . ADHD (attention deficit hyperactivity disorder)   . Anxiety    History reviewed. No pertinent surgical history. Family History:  Family History  Problem Relation Age of Onset  . Bipolar disorder Mother   . Schizophrenia Maternal Grandmother   . Schizophrenia Maternal Uncle    Family Psychiatric  History: See H&P Social History:  Social History   Substance and Sexual Activity  Alcohol Use Yes     Social History    Substance and Sexual Activity  Drug Use Yes  . Types: Cocaine, Marijuana, Benzodiazepines, Opium    Social History   Socioeconomic History  . Marital status: Married    Spouse name: Not on file  . Number of children: 2  . Years of education: Not on file  . Highest education level: Not on file  Occupational History  . Occupation: n/a  Social Needs  . Financial resource strain: Very hard  . Food insecurity:    Worry: Often true    Inability: Often true  . Transportation needs:    Medical: Yes    Non-medical: Yes  Tobacco Use  . Smoking status: Current Every Day Smoker    Packs/day: 0.50    Types: Cigarettes  . Smokeless tobacco: Never Used  Substance and Sexual Activity  . Alcohol use: Yes  . Drug use: Yes    Types: Cocaine, Marijuana, Benzodiazepines, Opium  . Sexual activity: Yes    Birth control/protection: Condom  Lifestyle  . Physical activity:    Days per week: Not on file    Minutes per session: Not on file  . Stress: Not on file  Relationships  . Social connections:    Talks on phone: Not on file    Gets together: Not on file    Attends religious service: Not on file    Active member of club or organization: Not on file    Attends meetings of clubs or organizations: Not on file    Relationship status: Not on file  Other Topics Concern  . Not on file  Social History Narrative  . Not on file   Additional Social History:    Pain Medications: See MAR Prescriptions: See MAR Over the Counter: See MAR History of alcohol / drug use?: Yes Longest period of sobriety (when/how long): Unable to quantify                    Sleep: Fair  Appetite:  Good  Current Medications: Current Facility-Administered Medications  Medication Dose Route Frequency Provider Last Rate Last Dose  . acetaminophen (TYLENOL) tablet 650 mg  650 mg Oral Q6H PRN Clapacs, Jackquline Denmark, MD   650 mg at 11/18/17 2227  . alum & mag hydroxide-simeth (MAALOX/MYLANTA) 200-200-20  MG/5ML suspension 30 mL  30 mL Oral Q4H PRN Clapacs, John T, MD      . feeding supplement (ENSURE ENLIVE) (ENSURE ENLIVE) liquid 237 mL  237 mL Oral BID BM Kaleia Longhi R, MD   237 mL at 11/22/17 1400  . hydrOXYzine (ATARAX/VISTARIL) tablet 50 mg  50 mg Oral TID PRN Clapacs, Jackquline Denmark, MD   50 mg at 11/22/17 1806  . magnesium hydroxide (MILK OF MAGNESIA) suspension 30 mL  30 mL Oral Daily PRN Clapacs, John T, MD      . QUEtiapine (SEROQUEL) tablet 50 mg  50 mg Oral QHS Sady Monaco, Ileene Hutchinson, MD   50 mg at 11/22/17 2001  . traZODone (DESYREL) tablet 100 mg  100 mg Oral QHS PRN Clapacs, Jackquline Denmark, MD   100 mg at 11/22/17 2001    Lab Results: No results found for this or any previous visit (from the past 48 hour(s)).  Blood Alcohol level:  Lab Results  Component Value Date   ETH <10 11/18/2017   ETH <5 08/11/2016    Metabolic Disorder Labs: Lab Results  Component Value Date   HGBA1C 5.6 11/19/2017   MPG 114 11/19/2017   MPG 108 08/11/2016   Lab Results  Component Value Date   PROLACTIN 31.6 (H) 09/06/2015   Lab Results  Component Value Date   CHOL 157 11/19/2017   TRIG 76 11/19/2017   HDL 40 (L) 11/19/2017   CHOLHDL 3.9 11/19/2017   VLDL 15 11/19/2017   LDLCALC 102 (H) 11/19/2017   LDLCALC 98 08/11/2016    Physical Findings: AIMS: Facial and Oral Movements Muscles of Facial Expression: None, normal Lips and Perioral Area: None, normal Jaw: None, normal Tongue: None, normal,Extremity Movements Upper (arms, wrists, hands, fingers): None, normal Lower (legs, knees, ankles, toes): None, normal, Trunk Movements Neck, shoulders, hips: None, normal, Overall Severity Severity of abnormal movements (highest score from questions above): None, normal Incapacitation due to abnormal movements: None, normal Patient's awareness of abnormal movements (rate only patient's report): No Awareness, Dental Status Current problems with teeth and/or dentures?: No Does patient usually wear dentures?: No   CIWA:  CIWA-Ar Total: 0 COWS:  COWS Total Score: 0  Musculoskeletal: Strength & Muscle Tone: within normal limits Gait & Station: normal Patient leans: N/A  Psychiatric Specialty Exam: Physical Exam  Nursing note and vitals reviewed.   Review of Systems  All other systems reviewed and are negative.   Blood pressure (!) 127/101, pulse 76, temperature 98.2 F (36.8 C), resp. rate 18, height 5\' 4"  (1.626 m), weight 49.8 kg, SpO2 100 %.Body mass index is 18.85 kg/m.  General Appearance: Casual  Eye Contact:  Fair  Speech:  Clear and Coherent  Volume:  Normal  Mood:  Euthymic  Affect:  Appropriate  Thought Process:  Coherent and Goal Directed  Orientation:  Full (Time, Place, and Person)  Thought Content:  Logical  Suicidal Thoughts:  No  Homicidal Thoughts:  No  Memory:  Immediate;   Fair  Judgement:  Fair  Insight:  Fair  Psychomotor Activity:  Normal  Concentration:  Concentration: Fair  Recall:  [employer] of Knowledge:  Fair  Language:  Fair  Akathisia:  No      Assets:  Resilience  ADL's:  Intact  Cognition:  WNL  Sleep:  Number of Hours: 8.25     Treatment Plan Summary: 27 yo male admitted due to SI and polysubstance abuse. He was referred to [facility] but no longer wants to pursue that. HE would like to do [facility]  Plan:  Mood disorder -Continue Seroquel 50 mg qhs  Polysubstance abuse -He now wants to go to [facility]  [place]ipo -Discharge tomorrow with follow up with [facility]. He will stay with friends  Haskell Riling, MD 11/23/2017, 1:54 PM

## 2017-11-23 NOTE — [facility] Group Notes (Signed)
11/23/2017 1PM  Type of Therapy/Topic:  Group Therapy:  Feelings about Diagnosis  Participation Level:  Did Not Attend   Description of Group:   This group will allow patients to explore their thoughts and feelings about diagnoses they have received. Patients will be guided to explore their level of understanding and acceptance of these diagnoses. Facilitator will encourage patients to process their thoughts and feelings about the reactions of others to their diagnosis and will guide patients in identifying ways to discuss their diagnosis with significant others in their lives. This group will be process-oriented, with patients participating in exploration of their own experiences, giving and receiving support, and processing challenge from other group members.   Therapeutic Goals: 1. Patient will demonstrate understanding of diagnosis as evidenced by identifying two or more symptoms of the disorder 2. Patient will be able to express two feelings regarding the diagnosis 3. Patient will demonstrate their ability to communicate their needs through discussion and/or role play  Summary of Patient Progress: Patient was encouraged and invited to attend group. Patient came to group but then left shortly after. Social worker will continue to encourage group participation in the future.        Therapeutic Modalities:   Cognitive Behavioral Therapy Brief Therapy Feelings Identification    Manuel Richards, [place] 11/23/2017 3:46 PM

## 2017-11-23 NOTE — [facility] Suicide Risk Assessment (Signed)
[facility] INPATIENT:  Family/Significant Other Suicide Prevention Education  Suicide Prevention Education:  Education Completed; Wife, Garlon Hatchet [phone] has been identified by the patient as the family member/significant other with whom the patient will be residing, and identified as the person(s) who will aid the patient in the event of a mental health crisis (suicidal ideations/suicide attempt).  With written consent from the patient, the family member/significant other has been provided the following suicide prevention education, prior to the and/or following the discharge of the patient.  The suicide prevention education provided includes the following:  Suicide risk factors  Suicide prevention and interventions  National Suicide Hotline telephone number  [facility] assessment telephone number  [facility] Emergency Assistance 911  [facility] and/or Residential Mobile Crisis Unit telephone number  Request made of family/significant other to:  Remove weapons (e.g., guns, rifles, knives), all items previously/currently identified as safety concern.    Remove drugs/medications (over-the-counter, prescriptions, illicit drugs), all items previously/currently identified as a safety concern.  The family member/significant other verbalizes understanding of the suicide prevention education information provided.  The family member/significant other agrees to remove the items of safety concern listed above.  CSW spoke with the patients wife. She reports that she wants him to do better and take better care of himself. She wants him to get the help that he needs. She mentions that the patient doesn't have any access to any guns or weapons. She reports that they are homeless and living in a shelter. No other concerns were reported.   Johny Shears 11/23/2017, 2:53 PM

## 2017-11-23 NOTE — Progress Notes (Signed)
Patient ID: Manuel Richards, male   DOB: 04/18/1990, 27 y.o.   MRN: [id number] DAR Note: Pt observed on the hallway still not interacting. Pt at assessment continue to endorse moderate anxiety and depression, "I feel worse today." Pt denied depression, anxiety, pain, SI/HI, AVH. Support, encouragement, and safe environment provided. All Pt questions and concerns addressed. Pt was med compliant. Will continue to monitor for safety.

## 2017-11-23 NOTE — Progress Notes (Signed)
Received Manuel Richards this AM in the dinning room. He has been OOB in the milieu at intervals and going to partial group therapy sessions. He endorsed feeling depressed and anxious, but feels in control of his feeling and will continue to work on his coping skills. He is looking forward to discharge tomorrow.

## 2017-11-24 MED ORDER — QUETIAPINE FUMARATE 50 MG PO TABS: 50.0000 mg | ORAL_TABLET | Freq: Every day | ORAL | 1 refills | Status: DC

## 2017-11-24 NOTE — [facility] Suicide Risk Assessment (Signed)
[facility] Discharge Suicide Risk Assessment   Principal Problem: Major depressive disorder, recurrent episode with mixed features [facility]) Discharge Diagnoses:  Patient Active Problem List   Diagnosis Date Noted  . Major depressive disorder, recurrent episode with mixed features (HCC) [F33.9] 11/19/2017    Priority: High  . Substance-induced psychotic disorder with hallucinations [facility]) [F19.951] 12/19/2014    Priority: High  . Opioid use disorder, moderate, dependence (HCC) [F11.20] 12/19/2014    Priority: High  . Cannabis use disorder, moderate, dependence (HCC) [F12.20] 12/19/2014    Priority: High  . Sedative, hypnotic or anxiolytic use disorder, severe, dependence (HCC) [F13.20] 08/12/2016  . Hepatitis C [B19.20] 08/12/2016  . Cocaine use disorder, moderate, dependence (HCC) [F14.20] 08/11/2016  . Tobacco use disorder [F17.200] 12/19/2014    Mental Status Per Nursing Assessment::   On Admission:  NA(denies having any SI)  Demographic Factors:  Male, Caucasian and Unemployed  Loss Factors: Financial problems/change in socioeconomic status  Historical Factors: Impulsivity  Risk Reduction Factors:   Responsible for children under 71 years of age  Continued Clinical Symptoms:  Alcohol/Substance Abuse/Dependencies  Cognitive Features That Contribute To Risk:  None    Suicide Risk:  Minimal Acute Risk: No identifiable suicidal ideation.    Follow-up Information    [facility] Follow up.   Contact information: [address] [place] [address] [phone]             Haskell Riling, MD 11/24/2017, 9:14 AM
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
## 2017-11-24 NOTE — Progress Notes (Signed)
Patient stayed in the milieu, alert and oriented, denying thoughts of self harm. Presented to the medication room and requested Trazodone in addition to his scheduled medication. However, patient unable to fall asleep. Has just presented to the nurses station saying "I don't think I will be able to sleep tonight". Drank hot chocolate then returned to his room. Currently in bed awake. Staff continue to monitor for safety.

## 2017-11-24 NOTE — Progress Notes (Signed)
  [facility] Adult Case Management Discharge Plan :  Will you be returning to the same living situation after discharge:  Yes,  Shelter At discharge, do you have transportation home?: Yes,  Mother coming at discharge Do you have the ability to pay for your medications: Yes,  Medicaid  Release of information consent forms completed and in the chart;  Patient's signature needed at discharge.  Patient to Follow up at: Follow-up Information    [facility] Go on 11/26/2017.   Why:  Please follow up with [facility] on Friday November 26, 2017 at 7:30am for IOP. Thank you. Contact information: [address] [place] [address] [phone]        [facility]. Call.   Specialty:  Behavioral Health Why:  Please call to follow up on bed avalibility. Your application has already been approved. Your are waiting on a open bed. Call daily. Thank you. Contact information: [address] [place] [address] [phone]          Next level of care provider has access to [facility] Link:no  Safety Planning and Suicide Prevention discussed: Yes,  Wife, Garlon Hatchet [phone]  Have you used any form of tobacco in the last 30 days? (Cigarettes, Smokeless Tobacco, Cigars, and/or Pipes): Yes  Has patient been referred to the Quitline?: Patient refused referral  Patient has been referred for addiction treatment: Yes  Johny Shears, LCSW 11/24/2017, 9:30 AM

## 2017-11-24 NOTE — Plan of Care (Signed)
Visible in the milieu. Restless but compliant with treatment. Denying thoughts of self harm.

## 2017-11-24 NOTE — Progress Notes (Signed)
Received [place] this AM after breakfast, he denied all of the psychiatric symptoms and feels safe to be discharge home today. He received his discharge order, the AVS was reviewed and his questions answered. He received his prescription. His personal belongings returned. He was discharged to self in route to the bus stop. His mother cancelled her arrival and his wife decided not to catch the bus with her children to pick her husband up. He was discharged at 1045 hrs.

## 2017-11-24 NOTE — Progress Notes (Signed)
Recreation Therapy Notes  INPATIENT RECREATION TR PLAN  Patient Details Name: Manuel Richards MRN: [id number] DOB: 1990/07/10 Today's Date: 11/24/2017  Rec Therapy Plan Is patient appropriate for Therapeutic Recreation?: Yes Treatment times per week: At least 3 Estimated Length of Stay: 5-7 days TR Treatment/Interventions: Group participation (Comment)  Discharge Criteria Pt will be discharged from therapy if:: Discharged Treatment plan/goals/alternatives discussed and agreed upon by:: Patient/family  Discharge Summary Short term goals set: Patient will engage in groups without prompting or encouragement from LRT x3 group sessions within 5 recreation therapy group sessions Short term goals met: Not met Reason goals not met: Patient did not attend any groups Therapeutic equipment acquired: N/A Reason patient discharged from therapy: Discharge from hospital Pt/family agrees with progress & goals achieved: Yes Date patient discharged from therapy: 11/24/17   Valeska Haislip 11/24/2017, 4:22 PM

## 2018-03-06 ENCOUNTER — Emergency Department
Admission: EM | Admit: 2018-03-06 | Discharge: 2018-03-08 | Disposition: A | Discharge status: Other Acute Inpatient Hospital | Payer: Medicaid Other | Attending: Emergency Medicine | Admitting: Emergency Medicine

## 2018-03-06 ENCOUNTER — Other Ambulatory Visit: Payer: Self-pay

## 2018-03-06 DIAGNOSIS — F1220 Cannabis dependence, uncomplicated: Secondary | ICD-10-CM | POA: Diagnosis present

## 2018-03-06 DIAGNOSIS — F1120 Opioid dependence, uncomplicated: Secondary | ICD-10-CM | POA: Diagnosis present

## 2018-03-06 DIAGNOSIS — F1320 Sedative, hypnotic or anxiolytic dependence, uncomplicated: Secondary | ICD-10-CM | POA: Diagnosis present

## 2018-03-06 DIAGNOSIS — Z79899 Other long term (current) drug therapy: Secondary | ICD-10-CM | POA: Diagnosis not present

## 2018-03-06 DIAGNOSIS — F339 Major depressive disorder, recurrent, unspecified: Secondary | ICD-10-CM | POA: Diagnosis not present

## 2018-03-06 DIAGNOSIS — F1420 Cocaine dependence, uncomplicated: Secondary | ICD-10-CM | POA: Diagnosis present

## 2018-03-06 DIAGNOSIS — F1121 Opioid dependence, in remission: Secondary | ICD-10-CM | POA: Diagnosis present

## 2018-03-06 DIAGNOSIS — F19951 Other psychoactive substance use, unspecified with psychoactive substance-induced psychotic disorder with hallucinations: Secondary | ICD-10-CM | POA: Diagnosis present

## 2018-03-06 DIAGNOSIS — F17210 Nicotine dependence, cigarettes, uncomplicated: Secondary | ICD-10-CM | POA: Insufficient documentation

## 2018-03-06 DIAGNOSIS — F17201 Nicotine dependence, unspecified, in remission: Secondary | ICD-10-CM | POA: Diagnosis present

## 2018-03-06 DIAGNOSIS — F17200 Nicotine dependence, unspecified, uncomplicated: Secondary | ICD-10-CM | POA: Diagnosis present

## 2018-03-06 DIAGNOSIS — F1421 Cocaine dependence, in remission: Secondary | ICD-10-CM | POA: Diagnosis present

## 2018-03-06 DIAGNOSIS — F3342 Major depressive disorder, recurrent, in full remission: Secondary | ICD-10-CM | POA: Diagnosis present

## 2018-03-06 DIAGNOSIS — F329 Major depressive disorder, single episode, unspecified: Secondary | ICD-10-CM | POA: Diagnosis present

## 2018-03-06 DIAGNOSIS — F312 Bipolar disorder, current episode manic severe with psychotic features: Secondary | ICD-10-CM

## 2018-03-06 HISTORY — DX: Other psychoactive substance use, unspecified, uncomplicated: F19.90

## 2018-03-06 LAB — COMPREHENSIVE METABOLIC PANEL
ALBUMIN: 4.9 g/dL (ref 3.5–5.0)
ALT: 32 U/L (ref 0–44)
ANION GAP: 8 (ref 5–15)
AST: 24 U/L (ref 15–41)
Alkaline Phosphatase: 118 U/L (ref 38–126)
BUN: 13 mg/dL (ref 6–20)
CHLORIDE: 102 mmol/L (ref 98–111)
CO2: 27 mmol/L (ref 22–32)
Calcium: 9.8 mg/dL (ref 8.9–10.3)
Creatinine, Ser: 1.04 mg/dL (ref 0.61–1.24)
GFR calc Af Amer: >60 mL/min (ref >60)
GFR calc non Af Amer: >60 mL/min (ref >60)
GLUCOSE: 130 mg/dL — AB (ref 70–99)
POTASSIUM: 4.1 mmol/L (ref 3.5–5.1)
Sodium: 137 mmol/L (ref 135–145)
Total Bilirubin: 1 mg/dL (ref 0.3–1.2)
Total Protein: 8.8 g/dL — ABNORMAL HIGH (ref 6.5–8.1)

## 2018-03-06 LAB — CBC
HCT: 49.1 % (ref 39.0–52.0)
Hemoglobin: 16.8 g/dL (ref 13.0–17.0)
MCH: 29.7 pg (ref 26.0–34.0)
MCHC: 34.2 g/dL (ref 30.0–36.0)
MCV: 86.9 fL (ref 80.0–100.0)
Platelets: 544 10*3/uL — ABNORMAL HIGH (ref 150–400)
RBC: 5.65 MIL/uL (ref 4.22–5.81)
RDW: 11.9 % (ref 11.5–15.5)
WBC: 14.7 10*3/uL — ABNORMAL HIGH (ref 4.0–10.5)
nRBC: 0 % (ref 0.0–0.2)

## 2018-03-06 LAB — ETHANOL: Alcohol, Ethyl (B): <10 mg/dL (ref <10)

## 2018-03-06 MED ORDER — ZIPRASIDONE MESYLATE 20 MG IM SOLR
10.0000 mg | Freq: Four times a day (QID) | INTRAMUSCULAR | Status: DC | PRN
  Administered 2018-03-06: 10 mg via INTRAMUSCULAR
  Filled 2018-03-06: qty 20

## 2018-03-06 MED ORDER — DIPHENHYDRAMINE HCL 50 MG/ML IJ SOLN
50.0000 mg | Freq: Once | INTRAMUSCULAR | Status: AC
  Administered 2018-03-06: 50 mg via INTRAMUSCULAR
  Filled 2018-03-06: qty 1

## 2018-03-06 MED ORDER — LORAZEPAM 2 MG PO TABS
2.0000 mg | ORAL_TABLET | Freq: Once | ORAL | Status: AC
  Administered 2018-03-06: 2 mg via ORAL
  Filled 2018-03-06: qty 1

## 2018-03-06 MED ORDER — OLANZAPINE 5 MG PO TABS
5.0000 mg | ORAL_TABLET | Freq: Two times a day (BID) | ORAL | Status: DC
  Administered 2018-03-06 – 2018-03-07 (×2): 5 mg via ORAL
  Filled 2018-03-06 (×2): qty 1

## 2018-03-06 NOTE — ED Notes (Signed)
Hourly rounding reveals patient in room after injection. Stable, in no acute distress. Q15 minute rounds and monitoring via [employer] to continue.

## 2018-03-06 NOTE — ED Notes (Signed)
Hourly rounding reveals patient sleeping in room. No complaints, stable, in no acute distress. Q15 minute rounds and monitoring via [employer] to continue. 

## 2018-03-06 NOTE — ED Provider Notes (Signed)
[facility] Emergency Department Provider Note ____________________________________________   First MD Initiated Contact with Patient 03/06/18 1507     (approximate)  I have reviewed the triage vital signs and the nursing notes.   HISTORY  Chief Complaint Drug Problem    HPI Tavio Majka is a 28 y.o. male who presents for evaluation due to substance abuse, anxiety, and concerned that he is going to have a "mental episode" due to quitting using.  Patient states that he uses heroin and methamphetamine.  He states his last use was today around 7 AM.  He denies any medical symptoms.  He denies any SI or HI.  Past Medical History:  Diagnosis Date  . ADHD (attention deficit hyperactivity disorder)   . Anxiety   . Drug use     Patient Active Problem List   Diagnosis Date Noted  . Major depressive disorder, recurrent episode with mixed features (HCC) 11/19/2017  . Sedative, hypnotic or anxiolytic use disorder, severe, dependence (HCC) 08/12/2016  . Hepatitis C 08/12/2016  . Cocaine use disorder, moderate, dependence (HCC) 08/11/2016  . Substance-induced psychotic disorder with hallucinations (HCC) 12/19/2014  . Opioid use disorder, moderate, dependence (HCC) 12/19/2014  . Cannabis use disorder, moderate, dependence (HCC) 12/19/2014  . Tobacco use disorder 12/19/2014    History reviewed. No pertinent surgical history.  Prior to Admission medications   Medication Sig Start Date End Date Taking? Authorizing Provider  QUEtiapine (SEROQUEL) 50 MG tablet Take 1 tablet (50 mg total) by mouth at bedtime. 11/24/17  Yes McNew, Ileene Hutchinson, MD    Allergies Penicillins  Family History  Problem Relation Age of Onset  . Bipolar disorder Mother   . Schizophrenia Maternal Grandmother   . Schizophrenia Maternal Uncle     Social History Social History   Tobacco Use  . Smoking status: Current Every Day Smoker    Packs/day: 0.50    Types: Cigarettes  .  Smokeless tobacco: Never Used  Substance Use Topics  . Alcohol use: Yes  . Drug use: Yes    Types: Cocaine, Marijuana, Benzodiazepines, Opium    Review of Systems  Constitutional: No fever. Eyes: No redness. ENT: No sore throat. Cardiovascular: Denies chest pain. Respiratory: Denies shortness of breath. Gastrointestinal: No vomiting.  Genitourinary: Negative for flank pain.  Musculoskeletal: Negative for back pain. Skin: Negative for rash. Neurological: Negative for headache.   ____________________________________________   PHYSICAL EXAM:  VITAL SIGNS: ED Triage Vitals  Enc Vitals Group     BP 03/06/18 1408 139/85     Pulse Rate 03/06/18 1408 (!) 113     Resp 03/06/18 1408 14     Temp 03/06/18 1408 97.8 F (36.6 C)     Temp Source 03/06/18 1408 Oral     SpO2 03/06/18 1408 100 %     Weight 03/06/18 1408 120 lb (54.4 kg)     Height 03/06/18 1408 5\' 6"  (1.676 m)     Head Circumference --      Peak Flow --      Pain Score 03/06/18 1410 0     Pain Loc --      Pain Edu? --      Excl. in GC? --     Constitutional: Alert and oriented. Well appearing and in no acute distress. Eyes: Conjunctivae are normal.  Head: Atraumatic. Nose: No congestion/rhinnorhea. Mouth/Throat: Mucous membranes are moist.   Neck: Normal range of motion.  Cardiovascular: Good peripheral circulation. Respiratory: Normal respiratory effort.  Gastrointestinal: No  distention.  Musculoskeletal: Extremities warm and well perfused.  Neurologic:  Normal speech and language. No gross focal neurologic deficits are appreciated.  Skin:  Skin is warm and dry. No rash noted. Psychiatric: Anxious appearing.  Somewhat pressured speech and tangential thoughts.  ____________________________________________   LABS (all labs ordered are listed, but only abnormal results are displayed)  Labs Reviewed  COMPREHENSIVE METABOLIC PANEL - Abnormal; Notable for the following components:      Result Value    Glucose, Bld 130 (*)    Total Protein 8.8 (*)    All other components within normal limits  CBC - Abnormal; Notable for the following components:   WBC 14.7 (*)    Platelets 544 (*)    All other components within normal limits  ETHANOL  URINE DRUG SCREEN, QUALITATIVE ([facility] ONLY)   ____________________________________________  EKG   ____________________________________________  RADIOLOGY    ____________________________________________   PROCEDURES  Procedure(s) performed: No  Procedures  Critical Care performed: No ____________________________________________   INITIAL IMPRESSION / ASSESSMENT AND PLAN / ED COURSE  Pertinent labs & imaging results that were available during my care of the patient were reviewed by me and considered in my medical decision making (see chart for details).  28 year old male with history as noted above presents with anxiety and feeling like he might have a "mental episode" as he wants to quit using heroin and methamphetamine.  He reports his last use was this morning.  I reviewed the past medical records in Epic; the patient was most recently seen and admitted last fall due to suicidal ideation.  On exam today, the patient is slightly tachycardic but his other vital signs are normal.  The remainder of his exam is unremarkable.  He denies SI or HI.  He is somewhat tangential in his thoughts and his speech is slightly rapid and pressured at times but he is able to answer all my questions appropriately.  At this time the patient does not demonstrate acute danger to self or others so I will not commit him at this time although he may end up requiring inpatient psychiatric admission.  I have ordered TTS and SOC evaluations.  [facility]  ----------------------------------------- 8:05 PM on 03/06/2018 -----------------------------------------  [facility] and [facility] psychiatric  ad[facility].  SOC made medication recommendations which I have ordered.  The lab work-up is unremarkable except for elevated WBC count which is nonspecific.  He is medically cleared at this time.  Disposition will be per psychiatric team recommendations. ____________________________________________   FINAL CLINICAL IMPRESSION(S) / ED DIAGNOSES  Final diagnoses:  Bipolar affective disorder, currently manic, severe, with psychotic features (HCC)      NEW MEDICATIONS STARTED DURING THIS VISIT:  New Prescriptions   No medications on file     Note:  This document was prepared using Dragon voice recognition software and may include unintentional dictation errors.    Dionne BucySiadecki, Marija Calamari, MD 03/06/18 2006

## 2018-03-06 NOTE — ED Notes (Signed)
Patient banging on door trying to get out of unit.

## 2018-03-06 NOTE — BH Assessment (Signed)
Referrals sent to the following: . [facility]   [address] [place] [address] [phone]  . [facility]  [address] [place] [address]  [phone]  . [facility] [address]   [address], [place] [address]  [phone]  . [facility]   [address] [place] [address]  [phone]  . [facility]  [address] [place], [place] [address]  [phone]

## 2018-03-06 NOTE — ED Notes (Signed)
Patient instructed to sit in Day Room till his room is cleaned and the bed made up. Before this nurse could get back to his room with linen patient was sitting on bed still wet with germicide.

## 2018-03-06 NOTE — ED Notes (Signed)
Pt. Transferred to [facility] from ED to room 5 after screening for contraband. Report to include Situation, Background, Assessment and Recommendations from Ann RN. Pt. Oriented to unit including Q15 minute rounds as well as the security cameras for their protection. Patient is alert and oriented, warm and dry in no acute distress. Patient denies SI, HI, and AVH. Pt. Encouraged to let me know if needs arise. 

## 2018-03-06 NOTE — ED Notes (Signed)
Hourly rounding reveals patient in room, still restless. No complaints, stable, in no acute distress. Q15 minute rounds and monitoring via [employer] to continue.

## 2018-03-06 NOTE — ED Triage Notes (Signed)
Pt states "I have a hx of mental episodes and i'm trying to quit using" when asked what she states "all substances" rapid speech. Last use today, was heroin. Track marks noted to arms. Denies SI or HI. Denies hallucinations. Denies ETOH.

## 2018-03-06 NOTE — BH Assessment (Signed)
Assessment Note  Manuel Richards is an 28 y.o. male. Patient presents to [facility]ED voluntarily due to manic behaviors. Patient was vague in his responses stating "I get really manic and then really depressed." Patient states he and his wife and two children are homeless. Patient denies current SI, however endorses SUA x 2 via pills.  Patient endorses ongoing heroin and cocaine use. Patient reports he last used yesterday.   Patient denies have outpatient mental health providers. Patients endorses previous inpatient psychiatric hospital admissions due to depression.  Patient doesn't currently have any involvement in the legal system.  Patient presented oriented x 4, guarded and preoccupied during assessment.    Diagnosis: Depression  Past Medical History:  Past Medical History:  Diagnosis Date  . ADHD (attention deficit hyperactivity disorder)   . Anxiety   . Drug use     History reviewed. No pertinent surgical history.  Family History:  Family History  Problem Relation Age of Onset  . Bipolar disorder Mother   . Schizophrenia Maternal Grandmother   . Schizophrenia Maternal Uncle     Social History:  reports that he has been smoking cigarettes. He has been smoking about 0.50 packs per day. He has never used smokeless tobacco. He reports current alcohol use. He reports current drug use. Drugs: Cocaine, Marijuana, Benzodiazepines, and Opium.  Additional Social History:  Alcohol / Drug Use Pain Medications: SEE PTA  Prescriptions: SEE PTA  Over the Counter: SEE PTA  History of alcohol / drug use?: Yes Longest period of sobriety (when/how long): None reported  Substance #1 Name of Substance 1: Heroin 1 - Age of First Use: Unknown 1 - Amount (size/oz): Unknown 1 - Frequency: Daily  1 - Duration: Ongoing  1 - Last Use / Amount: 03/05/2018 Substance #2 Name of Substance 2: Cocaine  2 - Age of First Use: Unknown 2 - Amount (size/oz): Unknown 2 - Frequency: Daily  2 -  Duration: Ongoing  2 - Last Use / Amount: 03/05/2018  CIWA: CIWA-Ar BP: 139/85 Pulse Rate: (!) 113 COWS:    Allergies:  Allergies  Allergen Reactions  . Penicillins Other (See Comments)    Reaction:  Unknown; childhood reaction     Home Medications: (Not in a hospital admission)   OB/GYN Status:  No LMP for male patient.  General Assessment Data Assessment unable to be completed: (Assessment completed ) [facility] Assessment: In system Is this a Tele or Face-to-Face Assessment?: Face-to-Face Is this an Initial Assessment or a Re-assessment for this encounter?: Initial Assessment Patient Accompanied by:: N/A Language Other than English: No Living Arrangements: Homeless/Shelter What gender do you identify as?: Male Marital status: Married JordanMaiden name: N/A Pregnancy Status: No Living Arrangements: Spouse/significant other, Children Can pt return to current living arrangement?: Yes Admission Status: Voluntary Is patient capable of signing voluntary admission?: Yes Referral Source: Self/Family/Friend Insurance type: Medicaid   Medical Screening Exam [facility] ONLY) Medical Exam completed: Yes  Crisis Care Plan Living Arrangements: Spouse/significant other, Children Legal Guardian: Other:(None reported) Name of Psychiatrist: None reported Name of Therapist: None reported  Education Status Is patient currently in school?: No Is the patient employed, unemployed or receiving disability?: Unemployed  Risk to self with the past 6 months Suicidal Ideation: No Has patient been a risk to self within the past 6 months prior to admission? : No Suicidal Intent: No Has patient had any suicidal intent within the past 6 months prior to admission? : No Is patient at  risk for suicide?: No Suicidal Plan?: No Has patient had any suicidal plan within the past 6 months prior to admission? : No Access to Means: Yes Specify Access to Suicidal Means:  illicit drugs  What has been your use of drugs/alcohol within the last 12 months?: Heroin, Cocaine  Previous Attempts/Gestures: Yes How many times?: 2 Other Self Harm Risks: Ongoing substance use  Triggers for Past Attempts: Unpredictable Intentional Self Injurious Behavior: None Family Suicide History: No Recent stressful life event(s): Loss (Comment), Financial Problems(Homelessness ) Persecutory voices/beliefs?: Yes Depression: Yes Depression Symptoms: Feeling worthless/self pity Substance abuse history and/or treatment for substance abuse?: Yes Suicide prevention information given to non-admitted patients: Not applicable  Risk to Others within the past 6 months Homicidal Ideation: No Does patient have any lifetime risk of violence toward others beyond the six months prior to admission? : No Thoughts of Harm to Others: No Current Homicidal Intent: No Current Homicidal Plan: No Access to Homicidal Means: No Identified Victim: None reported  History of harm to others?: No Assessment of Violence: None Noted Violent Behavior Description: None reported  Does patient have access to weapons?: No Criminal Charges Pending?: No Does patient have a court date: No Is patient on probation?: No  Psychosis Hallucinations: None noted Delusions: None noted  Mental Status Report Appearance/Hygiene: In hospital gown Eye Contact: Fair Motor Activity: Unremarkable Speech: Soft Level of Consciousness: Alert Mood: Sullen Affect: Sullen Anxiety Level: None Thought Processes: Coherent Judgement: Impaired Orientation: Person, Place, Time, Situation, Appropriate for developmental age Obsessive Compulsive Thoughts/Behaviors: None  Cognitive Functioning Concentration: Poor Memory: Recent Intact, Remote Intact Is patient IDD: No Insight: Poor Impulse Control: Poor Appetite: Fair Have you had any weight changes? : No Change Sleep: Decreased Total Hours of Sleep: 4 Vegetative Symptoms:  None  ADLScreening [facility]) Patient's cognitive ability adequate to safely complete daily activities?: Yes Patient able to express need for assistance with ADLs?: Yes Independently performs ADLs?: Yes (appropriate for developmental age)  Prior Inpatient Therapy Prior Inpatient Therapy: Yes Prior Therapy Dates: 2019 Prior Therapy Facilty/Provider(s): [facility]  Reason for Treatment: Depression  Prior Outpatient Therapy Prior Outpatient Therapy: No Does patient have an ACCT team?: No Does patient have Intensive In-House Services?  : No Does patient have [facility] services? : No Does patient have P4CC services?: No  ADL Screening (condition at time of admission) Patient's cognitive ability adequate to safely complete daily activities?: Yes Is the patient deaf or have difficulty hearing?: No Does the patient have difficulty seeing, even when wearing glasses/contacts?: No Does the patient have difficulty concentrating, remembering, or making decisions?: No Patient able to express need for assistance with ADLs?: Yes Does the patient have difficulty dressing or bathing?: No Independently performs ADLs?: Yes (appropriate for developmental age) Does the patient have difficulty walking or climbing stairs?: No Weakness of Legs: None Weakness of Arms/Hands: None  Home Assistive Devices/Equipment Home Assistive Devices/Equipment: None  Therapy Consults (therapy consults require a physician order) PT Evaluation Needed: No OT Evalulation Needed: No SLP Evaluation Needed: No Abuse/Neglect Assessment (Assessment to be complete while patient is alone) Abuse/Neglect Assessment Can Be Completed: Yes Physical Abuse: Denies Verbal Abuse: Denies Sexual Abuse: Denies Self-Neglect: Denies Values / Beliefs Cultural Requests During Hospitalization: None Spiritual Requests During Hospitalization: None Consults Spiritual Care Consult Needed: No Social Work Consult Needed: No Dispensing optician (For Healthcare) Does Patient Have a Medical Advance Directive?: No Would patient like information on creating a medical advance directive?: No - Patient declined  Disposition:  Disposition Initial Assessment Completed for this Encounter: Yes Patient referred to: Other (Comment)(pending SOC )  On Site Evaluation by:   Reviewed with Physician:    Galen Manila, [facility], LCAS-A 03/06/2018 4:34 PM

## 2018-03-06 NOTE — ED Notes (Signed)
Pt keeps trying to leave room and go in other rooms. Police Officers have told him to stay in room. He is standing in door way with no problems at this time.

## 2018-03-06 NOTE — ED Notes (Signed)
Pt took pants off and was walking in hall. Pt informed that he needs to put his pants back on and that he cannot walk in the hall without pants. Pt compliant at this time.

## 2018-03-06 NOTE — ED Notes (Signed)
Spoke with Dr Marisa Severin about pt and new orders received.

## 2018-03-06 NOTE — ED Notes (Signed)
[facility] consult in process

## 2018-03-06 NOTE — ED Notes (Signed)
Patient pacing, manic, responding to internal stimuli, only moderately redirectable.

## 2018-03-06 NOTE — ED Notes (Signed)
IVC 

## 2018-03-06 NOTE — ED Notes (Signed)
Pt continues to come out of room into the halls. Pt able to be verbally redirected at this time.

## 2018-03-06 NOTE — ED Notes (Signed)
Pt belongings: black slides, black shorts, purple button down shirt, green plaid underwear, $1 in pocket of shorts, lighter. No jewelry or phone.  2 knives given to officer in lobby.   Dressed out by [employer] EDT and this Charity fundraiserN.

## 2018-03-06 NOTE — ED Notes (Signed)
Wife left contact information: Dalbert Diponio [phone]

## 2018-03-06 NOTE — ED Notes (Signed)
TTS at bedside. 

## 2018-03-07 DIAGNOSIS — F1320 Sedative, hypnotic or anxiolytic dependence, uncomplicated: Secondary | ICD-10-CM

## 2018-03-07 DIAGNOSIS — F17200 Nicotine dependence, unspecified, uncomplicated: Secondary | ICD-10-CM

## 2018-03-07 DIAGNOSIS — F1420 Cocaine dependence, uncomplicated: Secondary | ICD-10-CM

## 2018-03-07 DIAGNOSIS — F1220 Cannabis dependence, uncomplicated: Secondary | ICD-10-CM

## 2018-03-07 DIAGNOSIS — F339 Major depressive disorder, recurrent, unspecified: Secondary | ICD-10-CM

## 2018-03-07 DIAGNOSIS — F19951 Other psychoactive substance use, unspecified with psychoactive substance-induced psychotic disorder with hallucinations: Secondary | ICD-10-CM

## 2018-03-07 DIAGNOSIS — F1120 Opioid dependence, uncomplicated: Secondary | ICD-10-CM

## 2018-03-07 LAB — URINE DRUG SCREEN, QUALITATIVE (ARMC ONLY)
Amphetamines, Ur Screen: POSITIVE — AB
Barbiturates, Ur Screen: NOT DETECTED
Benzodiazepine, Ur Scrn: POSITIVE — AB
Cannabinoid 50 Ng, Ur ~~LOC~~: POSITIVE — AB
Cocaine Metabolite,Ur ~~LOC~~: NOT DETECTED
MDMA (ECSTASY) UR SCREEN: NOT DETECTED
Methadone Scn, Ur: NOT DETECTED
Opiate, Ur Screen: NOT DETECTED
Phencyclidine (PCP) Ur S: NOT DETECTED
TRICYCLIC, UR SCREEN: POSITIVE — AB

## 2018-03-07 MED ORDER — BUPROPION HCL ER (XL) 150 MG PO TB24
150.0000 mg | ORAL_TABLET | Freq: Every day | ORAL | Status: DC
  Administered 2018-03-07 – 2018-03-08 (×2): 150 mg via ORAL
  Filled 2018-03-07 (×2): qty 1

## 2018-03-07 MED ORDER — QUETIAPINE FUMARATE ER 50 MG PO TB24
100.0000 mg | ORAL_TABLET | Freq: Every day | ORAL | Status: DC
  Administered 2018-03-07: 100 mg via ORAL
  Filled 2018-03-07 (×2): qty 2

## 2018-03-07 NOTE — BH Assessment (Signed)
This Clinical research associate spoke with pt's wife Abdi Rolle: [phone]) who reports pt "has not slept in 5 days and when he talks he does not make sense". She reports pt has past hx with substance use; however, she was unable to report whether or not pt has had any recent relapses. She confirmed that her, pt and their 2 children (28yo & 1yo) have been living in a hotel for the last 2 months. She reports that pt has hx of non-compliance with his medications. She also reports his manic behaviors are usually triggered when pt is not sleeping.

## 2018-03-07 NOTE — BH Assessment (Signed)
This Clinical research associate attempted to call pt's wife Kedar Eisley: [phone]) - no answer. HIPAA compliant voicemail left with callback number.

## 2018-03-07 NOTE — ED Notes (Signed)
Report to include Situation, Background, Assessment, and Recommendations received from Rhea RN. Patient alert and oriented, warm and dry, in no acute distress. Patient denies SI, HI, AVH and pain. Patient made aware of Q15 minute rounds and security cameras for their safety. Patient instructed to come to me with needs or concerns. 

## 2018-03-07 NOTE — ED Notes (Signed)
Hourly rounding reveals patient sleeping in room. No complaints, stable, in no acute distress. Q15 minute rounds and monitoring via [employer] to continue. 

## 2018-03-07 NOTE — BH Assessment (Addendum)
Patient is to be admitted to [facility] by Dr. Viviano Simas.  Attending Physician will be Dr. Jennet Maduro.   Patient has been assigned to room 312, by [facility] Charge Nurse Gigi.   Intake Paper Work has been signed and placed on patient chart.  ER staff is aware of the admission:  Irving Burton, ER Secretary    Dr. Don Perking, ER MD   Prudencio Burly, Patient's Nurse   Sharmon Leyden, Patient Access.   *Due to staffing, pt is to be admitted to [facility] in the AM (03/08/2018)

## 2018-03-07 NOTE — ED Provider Notes (Signed)
-----------------------------------------   6:32 AM on 03/07/2018 -----------------------------------------   Blood pressure (!) 130/103, pulse (!) 113, temperature 97.6 F (36.4 C), temperature source Oral, resp. rate 19, height 5\' 6"  (1.676 m), weight 54.4 kg, SpO2 100 %.  The patient is calm and cooperative at this time.  There have been no acute events since the last update.  Awaiting disposition plan from Behavioral Medicine team.    Irean Hong, MD 03/07/18 [phone]

## 2018-03-07 NOTE — Progress Notes (Signed)
[facility]  03/07/2018 1:31 PM Manuel Richards  MRN:  [id number]   Subjective: "I have been self-medicating with drugs, and I have not slept in days."   Principal Problem: Substance-induced psychotic disorder with hallucinations (HCC) Diagnosis:   Patient Active Problem List   Diagnosis Date Noted  . Major depressive disorder, recurrent episode with mixed features (HCC) [F33.9] 11/19/2017  . Sedative, hypnotic or anxiolytic use disorder, severe, dependence (HCC) [F13.20] 08/12/2016  . Hepatitis C [B19.20] 08/12/2016  . Cocaine use disorder, moderate, dependence (HCC) [F14.20] 08/11/2016  . 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 Opioid use disorder, moderate, dependence (HCC) [F11.20] 12/19/2014  . Cannabis use disorder, moderate, dependence (HCC) [F12.20] 12/19/2014  . Tobacco use disorder [F17.200] 12/19/2014   Total Time spent with patient: 35 minutes  Past Psychiatric History: Manuel LoopJoshua Dustin Richards is a 28 y.o. male with a longstanding history of polysubstance abuse, and bipolar illness which likely is precipitated by substance use.   On evaluation today, patient is requesting Xanax and his ADHD medication.  He reports that he has been self-medicating with marijuana and opiates.  He reports that he was once in a Suboxone clinic, but could not tolerate the taste of Suboxone.  He states he does not have a vehicle in order to be compliant with the methadone program.  Despite this, patient states he is interested in substance use treatment.  He is currently not working.  He and his wife and 2 young children have been evicted from their trailer and have been staying in the [facility] for the past 2 months.  He is worried about the finances.  Patient becomes tearful when discussing his family history.  He does not recall previously being treated for depression, but does endorse depressed mood and insomnia.  He previously had been  prescribed Seroquel with good effect for mood stabilization and sleep.  He is currently denying suicidal and homicidal ideation.  He is denying auditory visual hallucinations.   Past Medical History:  Past Medical History:  Diagnosis Date  . ADHD (attention deficit hyperactivity disorder)   . Anxiety   . Drug use    History reviewed. No pertinent surgical history. Family History:  Family History  Problem Relation Age of Onset  . Bipolar disorder Mother   . Schizophrenia Maternal Grandmother   . Schizophrenia Maternal Uncle    Family Psychiatric  History: Depression Social History:  Social History   Substance and Sexual Activity  Alcohol Use Yes     Social History   Substance and Sexual Activity  Drug Use Yes  . Types: Cocaine, Marijuana, Benzodiazepines, Opium    Social History   Socioeconomic History  . Marital status: Married    Spouse name: Not on file  . Number of children: 2  . Years of education: Not on file  . Highest education level: Not on file  Occupational History  . Occupation: n/a  Social Needs  . Financial resource strain: Very hard  . Food insecurity:    Worry: Often true    Inability: Often true  . Transportation needs:    Medical: Yes    Non-medical: Yes  Tobacco Use  . Smoking status: Current Every Day Smoker    Packs/day: 0.50    Types: Cigarettes  . Smokeless tobacco: Never Used  Substance and Sexual Activity  . Alcohol use: Yes  . Drug use: Yes    Types: Cocaine, Marijuana, Benzodiazepines, Opium  . Sexual activity: Yes  Birth control/protection: Condom  Lifestyle  . Physical activity:    Days per week: Not on file    Minutes per session: Not on file  . Stress: Not on file  Relationships  . Social connections:    Talks on phone: Not on file    Gets together: Not on file    Attends religious service: Not on file    Active member of club or organization: Not on file    Attends meetings of clubs or organizations: Not on file     Relationship status: Not on file  Other Topics Concern  . Not on file  Social History Narrative  . Not on file   Additional Social History:    Pain Medications: SEE PTA  Prescriptions: SEE PTA  Over the Counter: SEE PTA  History of alcohol / drug use?: Yes Longest period of sobriety (when/how long): None reported  Name of Substance 1: Heroin 1 - Age of First Use: Unknown 1 - Amount (size/oz): Unknown 1 - Frequency: Daily  1 - Duration: Ongoing  1 - Last Use / Amount: 03/05/2018 Name of Substance 2: Cocaine  2 - Age of First Use: Unknown 2 - Amount (size/oz): Unknown 2 - Frequency: Daily  2 - Duration: Ongoing  2 - Last Use / Amount: 03/05/2018                Sleep: Fair  Appetite:  Good  Current Medications: Current Facility-Administered Medications  Medication Dose Route Frequency Provider Last Rate Last Dose  . buPROPion (WELLBUTRIN XL) 24 hr tablet 150 mg  150 mg Oral Daily Mariel CraftMaurer, Satin Boal M, MD      . QUEtiapine (SEROQUEL XR) 24 hr tablet 100 mg  100 mg Oral QHS Mariel CraftMaurer, Clorine Swing M, MD      . ziprasidone (GEODON) injection 10 mg  10 mg Intramuscular Q6H PRN Dionne BucySiadecki, Sebastian, MD   10 mg at 03/06/18 2045   Current Outpatient Medications  Medication Sig Dispense Refill  . QUEtiapine (SEROQUEL) 50 MG tablet Take 1 tablet (50 mg total) by mouth at bedtime. 30 tablet 1    Lab Results:  Results for orders placed or performed during the hospital encounter of 03/06/18 (from the past 48 hour(s))  Urine Drug Screen, Qualitative     Status: Abnormal   Collection Time: 03/06/18 10:42 AM  Result Value Ref Range   Tricyclic, Ur Screen POSITIVE (A) NONE DETECTED   Amphetamines, Ur Screen POSITIVE (A) NONE DETECTED   MDMA (Ecstasy)Ur Screen NONE DETECTED NONE DETECTED   Cocaine Metabolite,Ur [place] NONE DETECTED NONE DETECTED   Opiate, Ur Screen NONE DETECTED NONE DETECTED   Phencyclidine (PCP) Ur S NONE DETECTED NONE DETECTED   Cannabinoid 50 Ng, Ur [place] POSITIVE (A) NONE  DETECTED   Barbiturates, Ur Screen NONE DETECTED NONE DETECTED   Benzodiazepine, Ur Scrn POSITIVE (A) NONE DETECTED   Methadone Scn, Ur NONE DETECTED NONE DETECTED    Comment: (NOTE) Tricyclics + metabolites, urine    Cutoff 1000 ng/mL Amphetamines + metabolites, urine  Cutoff 1000 ng/mL MDMA (Ecstasy), urine              Cutoff 500 ng/mL Cocaine Metabolite, urine          Cutoff 300 ng/mL Opiate + metabolites, urine        Cutoff 300 ng/mL Phencyclidine (PCP), urine         Cutoff 25 ng/mL Cannabinoid, urine  Cutoff 50 ng/mL Barbiturates + metabolites, urine  Cutoff 200 ng/mL Benzodiazepine, urine              Cutoff 200 ng/mL Methadone, urine                   Cutoff 300 ng/mL The urine drug screen provides only a preliminary, unconfirmed analytical test result and should not be used for non-medical purposes. Clinical consideration and professional judgment should be applied to any positive drug screen result due to possible interfering substances. A more specific alternate chemical method must be used in order to obtain a confirmed analytical result. Gas chromatography / mass spectrometry (GC/MS) is the preferred confirmat ory method. Performed at [facility], [address] [place], [place] [address]   Comprehensive metabolic panel     Status: Abnormal   Collection Time: 03/06/18  2:11 PM  Result Value Ref Range   Sodium 137 135 - 145 mmol/L   Potassium 4.1 3.5 - 5.1 mmol/L   Chloride 102 98 - 111 mmol/L   CO2 27 22 - 32 mmol/L   Glucose, Bld 130 (H) 70 - 99 mg/dL   BUN 13 6 - 20 mg/dL   Creatinine, Ser 7.51 0.61 - 1.24 mg/dL   Calcium 9.8 8.9 - 70.0 mg/dL   Total Protein 8.8 (H) 6.5 - 8.1 g/dL   Albumin 4.9 3.5 - 5.0 g/dL   AST 24 15 - 41 U/L   ALT 32 0 - 44 U/L   Alkaline Phosphatase 118 38 - 126 U/L   Total Bilirubin 1.0 0.3 - 1.2 mg/dL   GFR calc non Af Amer >60 >60 mL/min   GFR calc Af Amer >60 >60 mL/min   Anion gap 8 5 - 15     Comment: Performed at [facility], [address], [place], [place] [address]  Ethanol     Status: None   Collection Time: 03/06/18  2:11 PM  Result Value Ref Range   Alcohol, Ethyl (B) <10 <10 mg/dL    Comment: (NOTE) Lowest detectable limit for serum alcohol is 10 mg/dL. For medical purposes only. Performed at [facility], [address] [place], [place] [address]   cbc     Status: Abnormal   Collection Time: 03/06/18  2:11 PM  Result Value Ref Range   WBC 14.7 (H) 4.0 - 10.5 K/uL   RBC 5.65 4.22 - 5.81 MIL/uL   Hemoglobin 16.8 13.0 - 17.0 g/dL   HCT 91.6 38.4 - 66.5 %   MCV 86.9 80.0 - 100.0 fL   MCH 29.7 26.0 - 34.0 pg   MCHC 34.2 30.0 - 36.0 g/dL   RDW 99.3 57.0 - 17.7 %   Platelets 544 (H) 150 - 400 K/uL   nRBC 0.0 0.0 - 0.2 %    Comment: Performed at [facility], [address] [place], [place] [address]    Blood Alcohol level:  Lab Results  Component Value Date   [facility] <10 03/06/2018   ETH <10 11/18/2017    Metabolic Disorder Labs: Lab Results  Component Value Date   HGBA1C 5.6 11/19/2017   MPG 114 11/19/2017   MPG 108 08/11/2016   Lab Results  Component Value Date   PROLACTIN 31.6 (H) 09/06/2015   Lab Results  Component Value Date   CHOL 157 11/19/2017   TRIG 76 11/19/2017   HDL 40 (L) 11/19/2017   CHOLHDL 3.9 11/19/2017   VLDL 15 11/19/2017   LDLCALC 102 (  H) 11/19/2017   LDLCALC 98 08/11/2016    Physical Findings: AIMS:  , ,  ,  ,    CIWA:    COWS:     Musculoskeletal: Strength & Muscle Tone: within normal limits Gait & Station: normal Patient leans: N/A  Psychiatric Specialty Exam: Physical Exam  Nursing note and vitals reviewed. Constitutional: He appears well-developed and well-nourished.  HENT:  Head: Normocephalic.  Eyes: Pupils are equal, round, and reactive to light.  Neck: Normal range of motion.  Cardiovascular:  Tachycardia  Respiratory: Effort normal.  Musculoskeletal: Normal range of  motion.  Neurological: He is alert.  Forgetful during conversation, and some memory loss from last few days.  Review of Systems  Constitutional: Negative.   HENT: Negative.   Respiratory: Negative.   Cardiovascular: Negative.   Gastrointestinal: Negative.   Musculoskeletal: Negative.   Neurological: Negative.   Psychiatric/Behavioral: Positive for depression, hallucinations, memory loss, substance abuse and suicidal ideas. The patient is nervous/anxious and has insomnia.   All other systems reviewed and are negative.   Blood pressure (!) 130/103, pulse (!) 113, temperature 97.6 F (36.4 C), temperature source Oral, resp. rate 19, height 5\' 6"  (1.676 m), weight 54.4 kg, SpO2 100 %.Body mass index is 19.37 kg/m.  General Appearance: Casual  Eye Contact:  Fair  Speech:  Clear and Coherent and Rapid  Volume:  Normal  Mood:  Anxious, Depressed and Irritable  Affect:  Congruent  Thought Process:  Coherent and Descriptions of Associations: Tangential, with racing thoughts  Orientation:  Other:  Oriented to person and place  Thought Content:  Logical, Hallucinations: Auditory and visual decreasing, Rumination and Tangential  Suicidal Thoughts:  No  Homicidal Thoughts:  No  Memory:  Immediate;   Fair  Judgement:  Fair  Insight:  Fair  Psychomotor Activity:  Normal  Concentration:  Concentration: Fair  Recall:  [employer] of Knowledge:  Fair  Language:  Fair  Akathisia:  No      Assets:  Resilience  ADL's:  Intact  Cognition:  WNL  Sleep:        Treatment Plan Summary:  Continue involuntary commitment Admit to inpatient psychiatry  Patient is agreeable to starting medication management, risks, benefits, side effects and adverse effects reviewed with patient: Wellbutrin XL 150 mg daily for depression Seroquel XR 100 mg at bedtime for mood stabilization  Defer further medication management to inpatient treatment team.  Mariel Craft, MD 03/07/2018, 1:31 PM

## 2018-03-08 ENCOUNTER — Other Ambulatory Visit: Payer: Self-pay

## 2018-03-08 ENCOUNTER — Inpatient Hospital Stay
Admission: AD | Admit: 2018-03-08 | Discharge: 2018-03-09 | DRG: 885 | Disposition: A | Discharge status: Home / Self Care | Payer: Medicaid Other | Attending: Psychiatry | Admitting: Psychiatry

## 2018-03-08 DIAGNOSIS — F17210 Nicotine dependence, cigarettes, uncomplicated: Secondary | ICD-10-CM | POA: Diagnosis present

## 2018-03-08 DIAGNOSIS — F17201 Nicotine dependence, unspecified, in remission: Secondary | ICD-10-CM | POA: Diagnosis present

## 2018-03-08 DIAGNOSIS — Z818 Family history of other mental and behavioral disorders: Secondary | ICD-10-CM

## 2018-03-08 DIAGNOSIS — F329 Major depressive disorder, single episode, unspecified: Secondary | ICD-10-CM | POA: Diagnosis not present

## 2018-03-08 DIAGNOSIS — G4700 Insomnia, unspecified: Secondary | ICD-10-CM | POA: Diagnosis present

## 2018-03-08 DIAGNOSIS — F1410 Cocaine abuse, uncomplicated: Secondary | ICD-10-CM | POA: Diagnosis present

## 2018-03-08 DIAGNOSIS — F333 Major depressive disorder, recurrent, severe with psychotic symptoms: Secondary | ICD-10-CM | POA: Diagnosis present

## 2018-03-08 DIAGNOSIS — Z79899 Other long term (current) drug therapy: Secondary | ICD-10-CM | POA: Diagnosis not present

## 2018-03-08 DIAGNOSIS — F1320 Sedative, hypnotic or anxiolytic dependence, uncomplicated: Secondary | ICD-10-CM | POA: Diagnosis present

## 2018-03-08 DIAGNOSIS — F1220 Cannabis dependence, uncomplicated: Secondary | ICD-10-CM | POA: Diagnosis present

## 2018-03-08 DIAGNOSIS — R45851 Suicidal ideations: Secondary | ICD-10-CM | POA: Diagnosis present

## 2018-03-08 DIAGNOSIS — F17200 Nicotine dependence, unspecified, uncomplicated: Secondary | ICD-10-CM | POA: Diagnosis present

## 2018-03-08 MED ORDER — HYDROXYZINE HCL 50 MG PO TABS
50.0000 mg | ORAL_TABLET | Freq: Three times a day (TID) | ORAL | Status: DC | PRN
  Administered 2018-03-08: 50 mg via ORAL
  Filled 2018-03-08: qty 1

## 2018-03-08 MED ORDER — BUPROPION HCL ER (XL) 150 MG PO TB24
150.0000 mg | ORAL_TABLET | Freq: Every day | ORAL | Status: DC
  Administered 2018-03-09: 150 mg via ORAL
  Filled 2018-03-08: qty 1

## 2018-03-08 MED ORDER — ZIPRASIDONE MESYLATE 20 MG IM SOLR: 10.0000 mg | Freq: Four times a day (QID) | INTRAMUSCULAR | Status: DC | PRN

## 2018-03-08 MED ORDER — ACETAMINOPHEN 325 MG PO TABS: 650.0000 mg | ORAL_TABLET | Freq: Four times a day (QID) | ORAL | Status: DC | PRN

## 2018-03-08 MED ORDER — MAGNESIUM HYDROXIDE 400 MG/5ML PO SUSP: 30.0000 mL | Freq: Every day | ORAL | Status: DC | PRN

## 2018-03-08 MED ORDER — QUETIAPINE FUMARATE ER 50 MG PO TB24
100.0000 mg | ORAL_TABLET | Freq: Every day | ORAL | Status: DC
  Administered 2018-03-08: 100 mg via ORAL
  Filled 2018-03-08 (×2): qty 2

## 2018-03-08 MED ORDER — ALUM & MAG HYDROXIDE-SIMETH 200-200-20 MG/5ML PO SUSP: 30.0000 mL | ORAL | Status: DC | PRN

## 2018-03-08 NOTE — ED Notes (Signed)
Hourly rounding reveals patient sleeping in room. No complaints, stable, in no acute distress. Q15 minute rounds and monitoring via [employer] to continue. 

## 2018-03-08 NOTE — Progress Notes (Signed)
Admission Note:   Report was received form Geralynn Ochs, RN at [id number] on a 28 year old male who presents IVC in no acute distress for the treatment of SI and Depression. Patient appears flat and depressed. Patient was calm and cooperative with admission process. Patient denies SI/HI/AVH and contracts for safety with this Clinical research associate. Patient has a past medical history of ADHD, anxiety, and substance abuse. Patient rated his depression a "4/10" stating that "being in here" is making him feel this way. Patient rated his anxiety a "6/10" stating that "not being able to do what I want is making him anxious". Skin was assessed with Rivka Barbara, RN and found to have track marks to bilateral forearms, petechiae to his posterior neck, and bilateral legs. Patient searched and no contraband found and unit policies explained and understanding verbalized. Consents obtained. Food and fluids offered and accepted. Patient had no additional questions or concerns at this time.

## 2018-03-08 NOTE — BHH Group Notes (Signed)
LCSW Group Therapy Note  03/08/2018 1:00 PM  Type of Therapy/Topic:  Group Therapy:  Feelings about Diagnosis  Participation Level:  Did Not Attend   Description of Group:   This group will allow patients to explore their thoughts and feelings about diagnoses they have received. Patients will be guided to explore their level of understanding and acceptance of these diagnoses. Facilitator will encourage patients to process their thoughts and feelings about the reactions of others to their diagnosis and will guide patients in identifying ways to discuss their diagnosis with significant others in their lives. This group will be process-oriented, with patients participating in exploration of their own experiences, giving and receiving support, and processing challenge from other group members.   Therapeutic Goals: 1. Patient will demonstrate understanding of diagnosis as evidenced by identifying two or more symptoms of the disorder 2. Patient will be able to express two feelings regarding the diagnosis 3. Patient will demonstrate their ability to communicate their needs through discussion and/or role play  Summary of Patient Progress: Patient did not attend.     Therapeutic Modalities:   Cognitive Behavioral Therapy Brief Therapy Feelings Identification   Penni Homans, MSW, LCSW 03/08/2018 2:11 PM

## 2018-03-08 NOTE — BH Assessment (Signed)
TTS informed [facility] BMU Charge RN and ED RN of patient's admission orders to [facility].

## 2018-03-08 NOTE — Plan of Care (Signed)
New admission.  Problem: Education: Goal: Knowledge of [facility] Education information/materials will improve Outcome: Not Progressing Goal: Emotional status will improve Outcome: Not Progressing Goal: Mental status will improve Outcome: Not Progressing Goal: Verbalization of understanding the information provided will improve Outcome: Not Progressing   Problem: Safety: Goal: Periods of time without injury will increase Outcome: Not Progressing   Problem: Coping: Goal: Coping ability will improve Outcome: Not Progressing Goal: Will verbalize feelings Outcome: Not Progressing   Problem: Health Behavior/Discharge Planning: Goal: Ability to make decisions will improve Outcome: Not Progressing Goal: Compliance with therapeutic regimen will improve Outcome: Not Progressing

## 2018-03-08 NOTE — BHH Group Notes (Signed)
BHH Group Notes:  (Nursing/MHT/Case Management/Adjunct)  Date:  03/08/2018  Time:  10:17 PM  Type of Therapy:  Group Therapy  Participation Level:  Minimal  Participation Quality:  Appropriate  Affect:  Appropriate  Cognitive:  Appropriate  Insight:  Appropriate  Engagement in Group:  Engaged  Modes of Intervention:  Education  Summary of Progress/Problems: Manuel Richards attended group. Manuel Richards did not share his goal for the day and did not want to introduce self to the group Manuel Richards remained for the entire group. MHT informed patients of rules and expectations while on the unit. MHT informed patients they needed to provide their code to any one they wanted to be able to contact them. MHT informed patients of visitation hours and phone hours. MHT informed patients of to fill out snack sheet. MHT informed patients food was not allowed in the rooms. MHT informed patients there was no touching, hugging, or doing other patient's hair on the unit. MHT informed patients of routine checks and to cover appropriately throughout the night. MHT reminded patients not to give out personal information and to only use first names. Manuel Richards 03/08/2018, 10:17 PM

## 2018-03-08 NOTE — Tx Team (Signed)
Initial Treatment Plan 03/08/2018 6:36 PM [name]    PATIENT STRESSORS: Medication change or noncompliance Substance abuse Other: Homeless   PATIENT STRENGTHS: Ability for insight General fund of knowledge Motivation for treatment/growth   PATIENT IDENTIFIED PROBLEMS: Substance abuse  Depression   Anxiety                 DISCHARGE CRITERIA:  Ability to meet basic life and health needs Improved stabilization in mood, thinking, and/or behavior Motivation to continue treatment in a less acute level of care Reduction of life-threatening or endangering symptoms to within safe limits  PRELIMINARY DISCHARGE PLAN: Outpatient therapy Placement in alternative living arrangements  PATIENT/FAMILY INVOLVEMENT: This treatment plan has been presented to and reviewed with the patient, Manuel Richards. The patient has been given the opportunity to ask questions and make suggestions.  Audrielle Vankuren, RN 03/08/2018, 6:36 PM

## 2018-03-08 NOTE — ED Provider Notes (Signed)
-----------------------------------------   5:49 AM on 03/08/2018 -----------------------------------------   Blood pressure 122/75, pulse 91, temperature 98.2 F (36.8 C), temperature source Oral, resp. rate 16, height 5\' 6"  (1.676 m), weight 54.4 kg, SpO2 100 %.  The patient is calm and cooperative at this time.  There have been no acute events since the last update.  Awaiting disposition plan from Behavioral Medicine team.    Myrna Blazer, MD 03/08/18 [id number]

## 2018-03-09 ENCOUNTER — Encounter: Payer: Self-pay | Admitting: Psychiatry

## 2018-03-09 DIAGNOSIS — F333 Major depressive disorder, recurrent, severe with psychotic symptoms: Principal | ICD-10-CM

## 2018-03-09 MED ORDER — QUETIAPINE FUMARATE ER 200 MG PO TB24: 200.0000 mg | ORAL_TABLET | Freq: Every day | ORAL | 1 refills | Status: DC

## 2018-03-09 MED ORDER — QUETIAPINE FUMARATE ER 200 MG PO TB24
200.0000 mg | ORAL_TABLET | Freq: Every day | ORAL | Status: DC
  Filled 2018-03-09: qty 1

## 2018-03-09 NOTE — BHH Counselor (Signed)
Pt is being discharged within 24hrs, psa not required to be completed.

## 2018-03-09 NOTE — H&P (Signed)
Psychiatric Admission Assessment Adult  Patient Identification: Manuel Richards MRN:  [id number] Date of Evaluation:  03/09/2018 Chief Complaint:  Bipolar 1 Disorder Principal Diagnosis: MDD (major depressive disorder), recurrent, severe, with psychosis (HCC) Diagnosis:  Principal Problem:   MDD (major depressive disorder), recurrent, severe, with psychosis (HCC) Active Problems:   Cannabis use disorder, moderate, dependence (HCC)   Tobacco use disorder   Sedative, hypnotic or anxiolytic use disorder, severe, dependence (HCC)  History of Present Illness:   Identifying data. Mr. Thompkins is a 28 year old male with a history of depression.  Chief complaint. "It was scary."  History of present illness. Information was obtained from the patient and the chart. The patient came to the ER on 03/06/2018 complaining of severe insomnia of several days duration with some psychotic symptoms. Her was unable to distinguish between reality and hallucination which scared him. He developed suicidal thinking. He understands that drug use grossly contributed to this event. He has also been of his Seroquel. He has been in the ER for several days and has been able to sleep with Seroquel. He feels much better now. Hallucinations have resolved. He is well grounded in reality that calls for him to take care of his wife and two children who are staying at the motel and will be evicted. He is no longer suicidal or homicidal. He denies any symptoms of depression. He feels anxious about his family's situation. He report no symptoms of withdrawal. Vital signs are stable.  Past psychiatric history. Long struggle with substances, mood instability. Admitted here several times for suicidal ideation but denies any attempts. Does well on Seroquel when compliant.  Family psychiatric history. Multiple family members with substance abuse but also bipolar, ADHD, dementia.  Social history. Unemployed. He lives with his family at  a motel and has to [employer] for money every day. Hopes to "find the time" for residential treatment sometimes in the future.    Total Time spent with patient: 1 hour  Is the patient at risk to self? No.  Has the patient been a risk to self in the past 6 months? No.  Has the patient been a risk to self within the distant past? No.  Is the patient a risk to others? No.  Has the patient been a risk to others in the past 6 months? No.  Has the patient been a risk to others within the distant past? No.   Prior Inpatient Therapy:   Prior Outpatient Therapy:    Alcohol Screening: 1. How often do you have a drink containing alcohol?: Never 2. How many drinks containing alcohol do you have on a typical day when you are drinking?: 1 or 2 3. How often do you have six or more drinks on one occasion?: Never AUDIT-C Score: 0 4. How often during the last year have you found that you were not able to stop drinking once you had started?: Never 5. How often during the last year have you failed to do what was normally expected from you becasue of drinking?: Never 6. How often during the last year have you needed a first drink in the morning to get yourself going after a heavy drinking session?: Never 7. How often during the last year have you had a feeling of guilt of remorse after drinking?: Never 8. How often during the last year have you been unable to remember what happened the night before because you had been drinking?: Never 9. Have you or someone else been injured as  a result of your drinking?: No 10. Has a relative or friend or a doctor or another health worker been concerned about your drinking or suggested you cut down?: No Alcohol Use Disorder Identification Test Final Score (AUDIT): 0 Alcohol Brief Interventions/Follow-up: AUDIT Score <7 follow-up not indicated Substance Abuse History in the last 12 months:  Yes.   Consequences of Substance Abuse: Negative Previous Psychotropic Medications: Yes   Psychological Evaluations: No  Past Medical History:  Past Medical History:  Diagnosis Date  . ADHD (attention deficit hyperactivity disorder)   . Anxiety   . Drug use    History reviewed. No pertinent surgical history. Family History:  Family History  Problem Relation Age of Onset  . Bipolar disorder Mother   . Schizophrenia Maternal Grandmother   . Schizophrenia Maternal Uncle    Tobacco Screening: Have you used any form of tobacco in the last 30 days? (Cigarettes, Smokeless Tobacco, Cigars, and/or Pipes): Yes Tobacco use, Select all that apply: 5 or more cigarettes per day Are you interested in Tobacco Cessation Medications?: No, patient refused Counseled patient on smoking cessation including recognizing danger situations, developing coping skills and basic information about quitting provided: Refused/Declined practical counseling Social History:  Social History   Substance and Sexual Activity  Alcohol Use Yes     Social History   Substance and Sexual Activity  Drug Use Yes  . Types: Cocaine, Marijuana, Benzodiazepines, Opium    Additional Social History:      Pain Medications: see PTA Prescriptions: see PTA Over the Counter: see PTA History of alcohol / drug use?: Yes Longest period of sobriety (when/how long): Unknown Negative Consequences of Use: (N/A ) Withdrawal Symptoms: Other (Comment)(none) Name of Substance 1: Heroin 1 - Age of First Use: unknown                  Allergies:   Allergies  Allergen Reactions  . Penicillins Other (See Comments)    Reaction:  Unknown; childhood reaction    Lab Results: No results found for this or any previous visit (from the past 48 hour(s)).  Blood Alcohol level:  Lab Results  Component Value Date   ETH <10 03/06/2018   ETH <10 11/18/2017    Metabolic Disorder Labs:  Lab Results  Component Value Date   HGBA1C 5.6 11/19/2017   MPG 114 11/19/2017   MPG 108 08/11/2016   Lab Results  Component Value  Date   PROLACTIN 31.6 (H) 09/06/2015   Lab Results  Component Value Date   CHOL 157 11/19/2017   TRIG 76 11/19/2017   HDL 40 (L) 11/19/2017   CHOLHDL 3.9 11/19/2017   VLDL 15 11/19/2017   LDLCALC 102 (H) 11/19/2017   LDLCALC 98 08/11/2016    Current Medications: Current Facility-Administered Medications  Medication Dose Route Frequency Provider Last Rate Last Dose  . acetaminophen (TYLENOL) tablet 650 mg  650 mg Oral Q6H PRN Mariel Craft, MD      . alum & mag hydroxide-simeth (MAALOX/MYLANTA) 200-200-20 MG/5ML suspension 30 mL  30 mL Oral Q4H PRN Mariel Craft, MD      . hydrOXYzine (ATARAX/VISTARIL) tablet 50 mg  50 mg Oral TID PRN Mariel Craft, MD   50 mg at 03/08/18 1729  . magnesium hydroxide (MILK OF MAGNESIA) suspension 30 mL  30 mL Oral Daily PRN Mariel Craft, MD      . QUEtiapine (SEROQUEL XR) 24 hr tablet 200 mg  200 mg Oral QHS Mehkai Gallo B, MD      .  ziprasidone (GEODON) injection 10 mg  10 mg Intramuscular Q6H PRN Mariel CraftMaurer, Sheila M, MD       PTA Medications: Medications Prior to Admission  Medication Sig Dispense Refill Last Dose  . QUEtiapine (SEROQUEL) 50 MG tablet Take 1 tablet (50 mg total) by mouth at bedtime. 30 tablet 1 Past Week at Unknown time    Musculoskeletal: Strength & Muscle Tone: within normal limits Gait & Station: normal Patient leans: N/A  Psychiatric Specialty Exam: Physical Exam  Nursing note and vitals reviewed. Constitutional: He is oriented to person, place, and time. He appears well-developed and well-nourished.  HENT:  Head: Normocephalic and atraumatic.  Eyes: Pupils are equal, round, and reactive to light. Conjunctivae and EOM are normal.  Neck: Normal range of motion.  Cardiovascular: Normal rate and regular rhythm.  Respiratory: Effort normal and breath sounds normal.  GI: Soft.  Musculoskeletal: Normal range of motion.  Neurological: He is alert and oriented to person, place, and time.  Skin: Skin is  warm and dry.  Psychiatric: His speech is normal and behavior is normal. Thought content normal. His mood appears anxious. Cognition and memory are normal. He expresses impulsivity.    Review of Systems  Neurological: Negative.   Psychiatric/Behavioral: Positive for substance abuse. The patient has insomnia.   All other systems reviewed and are negative.   Blood pressure 118/83, pulse (!) 102, temperature (!) 97.5 F (36.4 C), temperature source Oral, resp. rate 18, height 5\' 6"  (1.676 m), weight 52.2 kg, SpO2 100 %.Body mass index is 18.56 kg/m.  See SRA                                                  Sleep:  Number of Hours: 8    Treatment Plan Summary: Daily contact with patient to assess and evaluate symptoms and progress in treatment and Medication management   [name] is a 28 year old male with a history od depression, psychosis and mood instability. He was admitted for suicidal ideation in the context of treatment noncompliance and relapse on multiple substances/  #Suicidal ideation -patient adamantly denies any thoughts, intentions or plans to hurt himself or others  #Mood/psychosis -denies psychotic symptoms -starte Seroquel XR 200 mg nightly  #Substance abuse -spoke with [facility]k [facility]intoHarvey Bryant of [facility] -will follow up with [facility] -declines residential treatment  #Smoking cessation -nicotine patch is available  #Labs -lipid panel, TSH, A1C obtained recently -EKG  #Disposition  -discharge to his motel -follow up with [facility]    Observation Level/Precautions:  15 minute checks  Laboratory:  CBC Chemistry Profile UDS UA  Psychotherapy:    Medications:    Consultations:    Discharge Concerns:    Estimated LOS:  Other:     Physician Treatment Plan for Primary Diagnosis: MDD (major depressive disorder), recurrent, severe, with psychosis (HCC) Long Term Goal(s): Improvement in symptoms so as ready for discharge  Short Term Goals: Ability to  identify changes in lifestyle to reduce recurrence of condition will improve, Ability to verbalize feelings will improve, Ability to disclose and discuss suicidal ideas, Ability to demonstrate self-control will improve, Ability to identify and develop effective coping behaviors will improve, Ability to maintain clinical measurements within normal limits will improve, Compliance with prescribed medications will improve and Ability to identify triggers associated with substance abuse/mental health issues will improve  Physician Treatment Plan for  Secondary Diagnosis: Principal Problem:   MDD (major depressive disorder), recurrent, severe, with psychosis (HCC) Active Problems:   Cannabis use disorder, moderate, dependence (HCC)   Tobacco use disorder   Sedative, hypnotic or anxiolytic use disorder, severe, dependence (HCC)  Long Term Goal(s): Improvement in symptoms so as ready for discharge  Short Term Goals: Ability to identify changes in lifestyle to reduce recurrence of condition will improve, Ability to demonstrate self-control will improve and Ability to identify triggers associated with substance abuse/mental health issues will improve  I certify that inpatient services furnished can reasonably be expected to improve the patient's condition.    Kristine Linea, MD 1/22/202012:11 PM

## 2018-03-09 NOTE — Plan of Care (Signed)
  Problem: Safety: Goal: Periods of time without injury will increase Outcome: Progressing  Patient continues to be safe on the unit no injury noted.

## 2018-03-09 NOTE — [facility] Suicide Risk Assessment (Signed)
[facility] Admission Suicide Risk Assessment   Nursing information obtained from:  Patient Demographic factors:  Male, Unemployed, Adolescent or young adult, Caucasian Current Mental Status:  NA Loss Factors:  Financial problems / change in socioeconomic status Historical Factors:  Family history of mental illness or substance abuse Risk Reduction Factors:  NA  Total Time spent with patient: 1 hour Principal Problem: MDD (major depressive disorder), recurrent, severe, with psychosis (HCC) Diagnosis:  Principal Problem:   MDD (major depressive disorder), recurrent, severe, with psychosis (HCC) Active Problems:   Cannabis use disorder, moderate, dependence (HCC)   Tobacco use disorder   Sedative, hypnotic or anxiolytic use disorder, severe, dependence (HCC)  Subjective Data: suicidal ideation  Continued Clinical Symptoms:  Alcohol Use Disorder Identification Test Final Score (AUDIT): 0 The "Alcohol Use Disorders Identification Test", Guidelines for Use in Primary Care, Second Edition.  World Science writer [facility]). Score between 0-7:  no or low risk or alcohol related problems. Score between 8-15:  moderate risk of alcohol related problems. Score between 16-19:  high risk of alcohol related problems. Score 20 or above:  warrants further diagnostic evaluation for alcohol dependence and treatment.   CLINICAL FACTORS:   Depression:   Impulsivity Alcohol/Substance Abuse/Dependencies   Musculoskeletal: Strength & Muscle Tone: within normal limits Gait & Station: normal Patient leans: N/A  Psychiatric Specialty Exam: Physical Exam  Nursing note and vitals reviewed. Psychiatric: He has a normal mood and affect. His speech is normal and behavior is normal. Thought content normal. Cognition and memory are normal. He expresses impulsivity.    Review of Systems  Neurological: Negative.   Psychiatric/Behavioral: Positive for substance abuse. The patient has insomnia.   All other systems  reviewed and are negative.   Blood pressure 118/83, pulse (!) 102, temperature (!) 97.5 F (36.4 C), temperature source Oral, resp. rate 18, height 5\' 6"  (1.676 m), weight 52.2 kg, SpO2 100 %.Body mass index is 18.56 kg/m.  General Appearance: Casual  Eye Contact:  Good  Speech:  Clear and Coherent  Volume:  Normal  Mood:  Anxious  Affect:  Appropriate  Thought Process:  Goal Directed and Descriptions of Associations: Intact  Orientation:  Full (Time, Place, and Person)  Thought Content:  WDL  Suicidal Thoughts:  No  Homicidal Thoughts:  No  Memory:  Immediate;   Fair Recent;   Fair Remote;   Fair  Judgement:  Poor  Insight:  Shallow  Psychomotor Activity:  Normal  Concentration:  Concentration: Fair and Attention Span: Fair  Recall:  [employer] of Knowledge:  Fair  Language:  Fair  Akathisia:  No  Handed:  Right  AIMS (if indicated):     Assets:  Communication Skills Desire for Improvement Intimacy Physical Health Resilience Social Support  ADL's:  Intact  Cognition:  WNL  Sleep:  Number of Hours: 8      COGNITIVE FEATURES THAT CONTRIBUTE TO RISK:  None    SUICIDE RISK:   Minimal: No identifiable suicidal ideation.  Patients presenting with no risk factors but with morbid ruminations; may be classified as minimal risk based on the severity of the depressive symptoms  PLAN OF CARE: hospital admission, medication management, substance abuse counseling, discharge planning.  Mr. Hogenson is a 28 year old male with a history od depression, psychosis and mood instability. He was admitted for suicidal ideation in the context of treatment noncompliance and relapse on multiple substances/  #Suicidal ideation -patient adamantly denies any thoughts, intentions or plans to hurt himself or others  #  Mood/psychosis -denies psychotic symptoms -starte Seroquel XR 200 mg nightly  #Substance abuse -spoke with Unk [facility]intoHarvey Bryant of [facility] -will follow up with [facility] -declines  residential treatment  #Smoking cessation -nicotine patch is available  #Labs -lipid panel, TSH, A1C -EKG  #Disposition  -discharge to his motel -follow up with [facility]  I certify that inpatient services furnished can reasonably be expected to improve the patient's condition.   Kristine LineaJolanta Ndrew Creason, MD 03/09/2018, 12:03 PM

## 2018-03-09 NOTE — B[facility] Suicide Risk Assessment (Signed)
[facility] INPATIENT:  Family/Significant Other Suicide Prevention Education  Suicide Prevention Education:  Patient Refusal for Family/Significant Other Suicide Prevention Education: The patient Manuel Richards has refused to provide written consent for family/significant other to be provided Family/Significant Other Suicide Prevention Education during admission and/or prior to discharge.  Physician notified.  Danya Spearman T Jeramine Delis 03/09/2018, 12:49 PM

## 2018-03-09 NOTE — Progress Notes (Signed)
  [facility] Adult Case Management Discharge Plan :  Will you be returning to the same living situation after discharge:  Yes,  pt is living in a hotel At discharge, do you have transportation home?: Yes,  CSW will assist with transportation Do you have the ability to pay for your medications: Yes,  insurance  Release of information consent forms completed and in the chart;  Patient's signature needed at discharge.  Patient to Follow up at: Follow-up Information    [facility] Go on 03/11/2018.   Why:  Please go to [facility] on Friday, March 11, 2018 at 7:15am: Unk Pinto will pick you up and take you there. Please bring photo ID, medication, insurance card. Thank you. Contact information: [address] [place] [address] [phone]           Next level of care provider has access to [facility] Link:no  Safety Planning and Suicide Prevention discussed: Yes,  with pt; pt declined family contact  Have you used any form of tobacco in the last 30 days? (Cigarettes, Smokeless Tobacco, Cigars, and/or Pipes): Yes  Has patient been referred to the Quitline?: Patient refused referral  Patient has been referred for addiction treatment: Pt. refused referral  Suzan Slick, LCSW 03/09/2018, 12:46 PM

## 2018-03-09 NOTE — Progress Notes (Signed)
Patient alert and oriented x 4, denies SI/HI/AVH affect is flat but brightens upon approach, no bizarre behavior, thoughts are organized and coherent, mood is bright and pleasant, makes appropriate eye contact, complaint with medication and attended evening wrap up group. 15 minutes safety checks maintained will continue to monitor.

## 2018-03-09 NOTE — B[facility] Suicide Risk Assessment (Signed)
[facility] Discharge Suicide Risk Assessment   Principal Problem: MDD (major depressive disorder), recurrent, severe, with psychosis (HCC) Discharge Diagnoses: Principal Problem:   MDD (major depressive disorder), recurrent, severe, with psychosis (HCC) Active Problems:   Cannabis use disorder, moderate, dependence (HCC)   Tobacco use disorder   Sedative, hypnotic or anxiolytic use disorder, severe, dependence (HCC)   Total Time spent with patient: 1 hour  Musculoskeletal: Strength & Muscle Tone: within normal limits Gait & Station: normal Patient leans: N/A  Psychiatric Specialty Exam: Review of Systems  Psychiatric/Behavioral: Positive for substance abuse.  All other systems reviewed and are negative.   Blood pressure 118/83, pulse (!) 102, temperature (!) 97.5 F (36.4 C), temperature source Oral, resp. rate 18, height 5\' 6"  (1.676 m), weight 52.2 kg, SpO2 100 %.Body mass index is 18.56 kg/m.   See SRA                                              Sleep:  Number of Hours: 8  Cognition: WNL  ADL's:  Intact   Mental Status Per Nursing Assessment::   On Admission:  NA  Demographic Factors:  Male, Adolescent or young adult, Caucasian and Unemployed  Loss Factors: Financial problems/change in socioeconomic status  Historical Factors: Family history of mental illness or substance abuse and Impulsivity  Risk Reduction Factors:   Responsible for children under 24 years of age, Sense of responsibility to family, Living with another person, especially a relative and Positive social support  Continued Clinical Symptoms:  Depression:   Impulsivity Alcohol/Substance Abuse/Dependencies  Cognitive Features That Contribute To Risk:  None    Suicide Risk:  Minimal: No identifiable suicidal ideation.  Patients presenting with no risk factors but with morbid ruminations; may be classified as minimal risk based on the severity of the depressive  symptoms    Plan Of Care/Follow-up recommendations:  Activity:  as tolerated Diet:  low sodium heart healthy Other:  keep follow up appointments  Kristine Linea, MD 03/09/2018, 12:33 PM

## 2018-03-09 NOTE — Progress Notes (Signed)
Recreation Therapy Notes  Date: 03/09/2018  Time: 9:30 am   Location: Craft room   Behavioral response: N/A   Intervention Topic: Problem Solving  Discussion/Intervention: Patient did not attend group.   Clinical Observations/Feedback:  Patient did not attend group.   Glenna Brunkow LRT/CTRS         Jelissa Espiritu 03/09/2018 11:48 AM

## 2018-03-09 NOTE — Progress Notes (Signed)
Patient ID: Manuel Richards, male   DOB: [birth year], 28 y.o.   MRN: [id number]  Discharge Note:  Patient denies SI/HI/AVH at this time. Discharge instructions, AVS, prescriptions, and transition record gone over with patient. Patient agrees to comply with medication management, follow-up visit, and outpatient therapy. Patient belongings returned to patient. Patient had no further questions and concerns addressed and answered at this time. Patient discharged to bus stop via courtesy car.

## 2018-03-09 NOTE — Discharge Summary (Signed)
Physician Discharge Summary Note  Patient:  Manuel Richards is an 28 y.o., male MRN:  [id number] DOB:  07/14/1990 Patient phone:  [phone] (home)  [place]   [address] [place] [place] [id number],  Total Time spent with patient: 1 hour  Date of Admission:  03/08/2018 Date of Discharge: 03/09/2018  Reason for Admission:  Suicidal ideation.  History of Present Illness:   Identifying data. Manuel Richards is a 28 year old male with a history of depression.  Chief complaint. "It was scary."  History of present illness. Information was obtained from the patient and the chart. The patient came to the ER on 03/06/2018 complaining of severe insomnia of several days duration with some psychotic symptoms. Her was unable to distinguish between reality and hallucination which scared him. He developed suicidal thinking. He understands that drug use grossly contributed to this event. He has also been of his Seroquel. He has been in the ER for several days and has been able to sleep with Seroquel. He feels much better now. Hallucinations have resolved. He is well grounded in reality that calls for him to take care of his wife and two children who are staying at the motel and will be evicted. He is no longer suicidal or homicidal. He denies any symptoms of depression. He feels anxious about his family's situation. He report no symptoms of withdrawal. Vital signs are stable.  Past psychiatric history. Long struggle with substances, mood instability. Admitted here several times for suicidal ideation but denies any attempts. Does well on Seroquel when compliant.  Family psychiatric history. Multiple family members with substance abuse but also bipolar, ADHD, dementia.  Social history. Unemployed. He lives with his family at a motel and has to [employer] for money every day. Hopes to "find the time" for residential treatment sometimes in the future.     Principal Problem: MDD (major depressive disorder), recurrent,  severe, with psychosis (HCC) Discharge Diagnoses: Principal Problem:   MDD (major depressive disorder), recurrent, severe, with psychosis (HCC) Active Problems:   Cannabis use disorder, moderate, dependence (HCC)   Tobacco use disorder   Sedative, hypnotic or anxiolytic use disorder, severe, dependence (HCC)   Past Medical History:  Past Medical History:  Diagnosis Date  . ADHD (attention deficit hyperactivity disorder)   . Anxiety   . Drug use    History reviewed. No pertinent surgical history. Family History:  Family History  Problem Relation Age of Onset  . Bipolar disorder Mother   . Schizophrenia Maternal Grandmother   . Schizophrenia Maternal Uncle    Social History:  Social History   Substance and Sexual Activity  Alcohol Use Yes     Social History   Substance and Sexual Activity  Drug Use Yes  . Types: Cocaine, Marijuana, Benzodiazepines, Opium    Social History   Socioeconomic History  . Marital status: Married    Spouse name: Not on file  . Number of children: 2  . Years of education: Not on file  . Highest education level: Not on file  Occupational History  . Occupation: n/a  Social Needs  . Financial resource strain: Very hard  . Food insecurity:    Worry: Often true    Inability: Often true  . Transportation needs:    Medical: Yes    Non-medical: Yes  Tobacco Use  . Smoking status: Current Every Day Smoker    Packs/day: 0.50    Types: Cigarettes  . Smokeless tobacco: Never Used  Substance and Sexual Activity  . Alcohol  use: Yes  . Drug use: Yes    Types: Cocaine, Marijuana, Benzodiazepines, Opium  . Sexual activity: Yes    Birth control/protection: Condom  Lifestyle  . Physical activity:    Days per week: Not on file    Minutes per session: Not on file  . Stress: Not on file  Relationships  . Social connections:    Talks on phone: Not on file    Gets together: Not on file    Attends religious service: Not on file    Active member  of club or organization: Not on file    Attends meetings of clubs or organizations: Not on file    Relationship status: Not on file  Other Topics Concern  . Not on file  Social History Narrative  . Not on file    Hospital Course:   Manuel Richards is a 29 year old male with a history od depression, psychosis and mood instability. He was admitted for suicidal ideation in the context of treatment noncomliance and substance use. He was restarted on medications and tolerated them well. The patient adamantly denies any thoughts, intentions or plans to hurt himself or others. He is able to contract for safety. He is forward thinking and optimistic about the future.   #Mood/psychosis -denies psychotic symptoms -restart Seroquel XR 200 mg nightly  #Substance abuse -spoke with Unk Pinto of [facility] -will follow up with [facility] -declines residential treatment  #Smoking cessation -nicotine patch is available  #Labs -lipid panel, TSH, A1C obtained recently -EKG  #Disposition  -discharge to his motel with family -follow up with [facility]   Physical Findings: AIMS: Facial and Oral Movements Muscles of Facial Expression: None, normal Lips and Perioral Area: None, normal Jaw: None, normal Tongue: None, normal,Extremity Movements Upper (arms, wrists, hands, fingers): None, normal Lower (legs, knees, ankles, toes): None, normal, Trunk Movements Neck, shoulders, hips: None, normal, Overall Severity Severity of abnormal movements (highest score from questions above): None, normal Incapacitation due to abnormal movements: None, normal Patient's awareness of abnormal movements (rate only patient's report): No Awareness, Dental Status Current problems with teeth and/or dentures?: Yes Does patient usually wear dentures?: No  CIWA:  CIWA-Ar Total: 1 COWS:  COWS Total Score: 2  Musculoskeletal: Strength & Muscle Tone: within normal limits Gait & Station: normal Patient leans: N/A  Psychiatric  Specialty Exam: Physical Exam  Nursing note and vitals reviewed. Psychiatric: He has a normal mood and affect. His speech is normal and behavior is normal. Thought content normal. Cognition and memory are normal. He expresses impulsivity.    Review of Systems  Neurological: Negative.   Psychiatric/Behavioral: Positive for substance abuse.  All other systems reviewed and are negative.   Blood pressure 118/83, pulse (!) 102, temperature (!) 97.5 F (36.4 C), temperature source Oral, resp. rate 18, height 5\' 6"  (1.676 m), weight 52.2 kg, SpO2 100 %.Body mass index is 18.56 kg/m.  General Appearance: Casual  Eye Contact:  Good  Speech:  Clear and Coherent  Volume:  Normal  Mood:  Euthymic  Affect:  Appropriate  Thought Process:  Goal Directed and Descriptions of Associations: Intact  Orientation:  Full (Time, Place, and Person)  Thought Content:  WDL  Suicidal Thoughts:  No  Homicidal Thoughts:  No  Memory:  Immediate;   Fair Recent;   Fair Remote;   Fair  Judgement:  Poor  Insight:  Shallow  Psychomotor Activity:  Normal  Concentration:  Concentration: Fair and Attention Span: Fair  Recall:  [employer]  of Knowledge:  Fair  Language:  Fair  Akathisia:  No  Handed:  Right  AIMS (if indicated):     Assets:  Communication Skills Desire for Improvement Intimacy Physical Health Resilience Social Support  ADL's:  Intact  Cognition:  WNL  Sleep:  Number of Hours: 8     Have you used any form of tobacco in the last 30 days? (Cigarettes, Smokeless Tobacco, Cigars, and/or Pipes): Yes  Has this patient used any form of tobacco in the last 30 days? (Cigarettes, Smokeless Tobacco, Cigars, and/or Pipes) Yes, Yes, A prescription for an FDA-approved tobacco cessation medication was offered at discharge and the patient refused  Blood Alcohol level:  Lab Results  Component Value Date   [facility] <10 03/06/2018   ETH <10 11/18/2017    Metabolic Disorder Labs:  Lab Results  Component  Value Date   HGBA1C 5.6 11/19/2017   MPG 114 11/19/2017   MPG 108 08/11/2016   Lab Results  Component Value Date   PROLACTIN 31.6 (H) 09/06/2015   Lab Results  Component Value Date   CHOL 157 11/19/2017   TRIG 76 11/19/2017   HDL 40 (L) 11/19/2017   CHOLHDL 3.9 11/19/2017   VLDL 15 11/19/2017   LDLCALC 102 (H) 11/19/2017   LDLCALC 98 08/11/2016    See Psychiatric Specialty Exam and Suicide Risk Assessment completed by Attending Physician prior to discharge.  Discharge destination:  Home  Is patient on multiple antipsychotic therapies at discharge:  No   Has Patient had three or more failed trials of antipsychotic monotherapy by history:  No  Recommended Plan for Multiple Antipsychotic Therapies: NA  Discharge Instructions    Diet - low sodium heart healthy   Complete by:  As directed    Increase activity slowly   Complete by:  As directed      Allergies as of 03/09/2018      Reactions   Penicillins Other (See Comments)   Reaction:  Unknown; childhood reaction       Medication List    STOP taking these medications   QUEtiapine 50 MG tablet Commonly known as:  SEROQUEL Replaced by:  QUEtiapine 200 MG 24 hr tablet     TAKE these medications     Indication  QUEtiapine 200 MG 24 hr tablet Commonly known as:  SEROQUEL XR Take 1 tablet (200 mg total) by mouth at bedtime. Replaces:  QUEtiapine 50 MG tablet  Indication:  Manic-Depression        Follow-up recommendations:  Activity:  as tolerated Diet:  low sodium heart healthy Other:  keep follow up appointments  Comments:    Signed: Kristine Linea, MD 03/09/2018, 12:37 PM

## 2019-08-21 DIAGNOSIS — Y999 Unspecified external cause status: Secondary | ICD-10-CM | POA: Insufficient documentation

## 2019-08-21 DIAGNOSIS — F17210 Nicotine dependence, cigarettes, uncomplicated: Secondary | ICD-10-CM | POA: Insufficient documentation

## 2019-08-21 DIAGNOSIS — Y929 Unspecified place or not applicable: Secondary | ICD-10-CM | POA: Insufficient documentation

## 2019-08-21 DIAGNOSIS — F909 Attention-deficit hyperactivity disorder, unspecified type: Secondary | ICD-10-CM | POA: Insufficient documentation

## 2019-08-21 DIAGNOSIS — S022XXA Fracture of nasal bones, initial encounter for closed fracture: Secondary | ICD-10-CM | POA: Insufficient documentation

## 2019-08-21 DIAGNOSIS — Y939 Activity, unspecified: Secondary | ICD-10-CM | POA: Insufficient documentation

## 2019-08-22 ENCOUNTER — Emergency Department: Payer: Self-pay

## 2019-08-22 ENCOUNTER — Other Ambulatory Visit: Payer: Self-pay

## 2019-08-22 ENCOUNTER — Emergency Department
Admission: EM | Admit: 2019-08-22 | Discharge: 2019-08-22 | Disposition: A | Discharge status: Home / Self Care | Payer: Self-pay | Attending: Student in an Organized Health Care Education/Training Program | Admitting: Student in an Organized Health Care Education/Training Program

## 2019-08-22 DIAGNOSIS — S022XXA Fracture of nasal bones, initial encounter for closed fracture: Secondary | ICD-10-CM

## 2019-08-22 MED ORDER — OXYCODONE-ACETAMINOPHEN 5-325 MG PO TABS: 1.0000 | ORAL_TABLET | ORAL | 0 refills | Status: AC | PRN

## 2019-08-22 MED ORDER — CLINDAMYCIN HCL 150 MG PO CAPS
300.0000 mg | ORAL_CAPSULE | Freq: Once | ORAL | Status: AC
  Administered 2019-08-22: 300 mg via ORAL
  Filled 2019-08-22: qty 2

## 2019-08-22 MED ORDER — OXYCODONE-ACETAMINOPHEN 5-325 MG PO TABS
1.0000 | ORAL_TABLET | Freq: Once | ORAL | Status: AC
  Administered 2019-08-22: 1 via ORAL
  Filled 2019-08-22: qty 1

## 2019-08-22 MED ORDER — CLINDAMYCIN HCL 300 MG PO CAPS: 300.0000 mg | ORAL_CAPSULE | Freq: Three times a day (TID) | ORAL | 0 refills | Status: AC
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
## 2019-08-28 ENCOUNTER — Other Ambulatory Visit: Payer: Self-pay

## 2019-08-28 ENCOUNTER — Encounter (HOSPITAL_COMMUNITY): Payer: Self-pay | Admitting: Emergency Medicine

## 2019-08-28 ENCOUNTER — Emergency Department (HOSPITAL_COMMUNITY)
Admission: EM | Admit: 2019-08-28 | Discharge: 2019-08-28 | Disposition: A | Discharge status: Home / Self Care | Payer: Self-pay | Attending: Emergency Medicine | Admitting: Emergency Medicine

## 2019-08-28 DIAGNOSIS — F1910 Other psychoactive substance abuse, uncomplicated: Secondary | ICD-10-CM

## 2019-08-28 DIAGNOSIS — F1110 Opioid abuse, uncomplicated: Secondary | ICD-10-CM | POA: Insufficient documentation

## 2019-08-28 DIAGNOSIS — Z79899 Other long term (current) drug therapy: Secondary | ICD-10-CM | POA: Insufficient documentation

## 2019-08-28 DIAGNOSIS — F17210 Nicotine dependence, cigarettes, uncomplicated: Secondary | ICD-10-CM | POA: Insufficient documentation

## 2019-08-28 DIAGNOSIS — E876 Hypokalemia: Secondary | ICD-10-CM

## 2019-08-28 LAB — CBC
HCT: 40.4 % (ref 39.0–52.0)
Hemoglobin: 13.3 g/dL (ref 13.0–17.0)
MCH: 29.2 pg (ref 26.0–34.0)
MCHC: 32.9 g/dL (ref 30.0–36.0)
MCV: 88.8 fL (ref 80.0–100.0)
Platelets: 352 10*3/uL (ref 150–400)
RBC: 4.55 MIL/uL (ref 4.22–5.81)
RDW: 12.3 % (ref 11.5–15.5)
WBC: 10.1 10*3/uL (ref 4.0–10.5)
nRBC: 0 % (ref 0.0–0.2)

## 2019-08-28 LAB — RAPID URINE DRUG SCREEN, HOSP PERFORMED
Amphetamines: NOT DETECTED
Barbiturates: NOT DETECTED
Benzodiazepines: NOT DETECTED
Cocaine: POSITIVE — AB
Opiates: POSITIVE — AB
Tetrahydrocannabinol: NOT DETECTED

## 2019-08-28 LAB — COMPREHENSIVE METABOLIC PANEL
ALT: 21 U/L (ref 0–44)
AST: 18 U/L (ref 15–41)
Albumin: 4.1 g/dL (ref 3.5–5.0)
Alkaline Phosphatase: 77 U/L (ref 38–126)
Anion gap: 9 (ref 5–15)
BUN: 6 mg/dL (ref 6–20)
CO2: 27 mmol/L (ref 22–32)
Calcium: 9.3 mg/dL (ref 8.9–10.3)
Chloride: 101 mmol/L (ref 98–111)
Creatinine, Ser: 1.28 mg/dL — ABNORMAL HIGH (ref 0.61–1.24)
GFR calc Af Amer: >60 mL/min (ref >60)
GFR calc non Af Amer: >60 mL/min (ref >60)
Glucose, Bld: 98 mg/dL (ref 70–99)
Potassium: 3 mmol/L — ABNORMAL LOW (ref 3.5–5.1)
Sodium: 137 mmol/L (ref 135–145)
Total Bilirubin: 0.5 mg/dL (ref 0.3–1.2)
Total Protein: 7.1 g/dL (ref 6.5–8.1)

## 2019-08-28 LAB — ETHANOL: Alcohol, Ethyl (B): <10 mg/dL (ref <10)

## 2019-08-28 MED ORDER — POTASSIUM CHLORIDE ER 10 MEQ PO TBCR: 40.0000 meq | EXTENDED_RELEASE_TABLET | Freq: Every day | ORAL | 0 refills | Status: DC

## 2019-08-28 MED ORDER — ACETAMINOPHEN 325 MG PO TABS
650.0000 mg | ORAL_TABLET | Freq: Once | ORAL | Status: AC
  Administered 2019-08-28: 650 mg via ORAL
  Filled 2019-08-28: qty 2
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
## 2019-08-28 NOTE — Discharge Instructions (Signed)
Take the potassium pills as directed. You can take Tylenol as needed to help with your tooth pain. Follow-up with your primary care provider. Have your potassium level rechecked in 1 week. Use the resource guide to help with detox. Return to the ER for chest pain, shortness of breath, injuries or falls, leg swelling.

## 2019-08-28 NOTE — ED Notes (Signed)
Patient verbalizes understanding of discharge instructions. Opportunity for questioning and answers were provided. Armband removed by staff, pt discharged from ED.  
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
## 2019-08-28 NOTE — ED Notes (Signed)
On assessment, patient reports that his mother wants him to say he has suicide thoughts but adamantly denies thoughts of SI and/or HI -he says he has been on heroin for 4 years, he has tried to detox before but did not work. -needs at this time is medicine for tooth ache  EDP at bedside

## 2019-08-28 NOTE — ED Triage Notes (Signed)
Pt presents to ED POV. Pt reports he wants to detox from heroin. Pt reports his last use was 6m. Pt denies and detox s/s. COWS - 0.

## 2019-08-28 NOTE — ED Notes (Signed)
Patient's mother called screaming on the phone, saying that she sent her son and daughter in law to the ED for detox and they called her to say that "they are being discharged with resources." -She states if they "go back to doing drugs, its your fault." -Explained to Mother that I do not know about the other patient she is talking about and I am yet to meet Manuel Richards, she continues to scream at this RN, saying "I know you will do the same to him, you better keep him and detox him."

## 2020-01-01 ENCOUNTER — Encounter: Payer: Self-pay | Admitting: *Deleted

## 2020-01-01 ENCOUNTER — Other Ambulatory Visit: Payer: Self-pay

## 2020-01-01 ENCOUNTER — Emergency Department: Payer: Self-pay

## 2020-01-01 DIAGNOSIS — J40 Bronchitis, not specified as acute or chronic: Secondary | ICD-10-CM | POA: Insufficient documentation

## 2020-01-01 DIAGNOSIS — F17210 Nicotine dependence, cigarettes, uncomplicated: Secondary | ICD-10-CM | POA: Insufficient documentation

## 2020-01-01 NOTE — ED Notes (Signed)
Pt ambulatory to Xray.

## 2020-01-01 NOTE — ED Triage Notes (Signed)
Pt to ED from home c/o central chest pain and SOB not resolving for 3-4 days.  States green productive cough.  Denies n/v/d, denies fevers.  Chest rise even and unlabored, skin WNL, in NAD at this time.

## 2020-01-01 NOTE — ED Notes (Signed)
Pt refusing blood work at this time. Pt states he doesn't want to be stuck again; states he understands the risks of not getting blood drawn. Misty Stanley, RN notified.

## 2020-01-02 ENCOUNTER — Emergency Department
Admission: EM | Admit: 2020-01-02 | Discharge: 2020-01-02 | Disposition: A | Discharge status: Home / Self Care | Payer: Self-pay | Attending: Emergency Medicine | Admitting: Emergency Medicine

## 2020-01-02 DIAGNOSIS — J40 Bronchitis, not specified as acute or chronic: Secondary | ICD-10-CM

## 2020-01-02 MED ORDER — ALBUTEROL SULFATE HFA 108 (90 BASE) MCG/ACT IN AERS: 2.0000 | INHALATION_SPRAY | Freq: Four times a day (QID) | RESPIRATORY_TRACT | 2 refills | Status: DC | PRN

## 2020-01-02 MED ORDER — IPRATROPIUM-ALBUTEROL 0.5-2.5 (3) MG/3ML IN SOLN
3.0000 mL | Freq: Once | RESPIRATORY_TRACT | Status: AC
  Administered 2020-01-02: 3 mL via RESPIRATORY_TRACT
  Filled 2020-01-02: qty 3

## 2020-01-02 MED ORDER — AZITHROMYCIN 250 MG PO TABS: ORAL_TABLET | ORAL | 0 refills | Status: DC

## 2020-01-02 MED ORDER — AZITHROMYCIN 500 MG PO TABS
500.0000 mg | ORAL_TABLET | Freq: Once | ORAL | Status: AC
  Administered 2020-01-02: 500 mg via ORAL
  Filled 2020-01-02: qty 1

## 2020-01-02 MED ORDER — PREDNISONE 20 MG PO TABS
60.0000 mg | ORAL_TABLET | Freq: Once | ORAL | Status: AC
  Administered 2020-01-02: 60 mg via ORAL
  Filled 2020-01-02: qty 3

## 2020-01-02 MED ORDER — PREDNISONE 20 MG PO TABS: 60.0000 mg | ORAL_TABLET | Freq: Every day | ORAL | 0 refills | Status: AC

## 2020-01-02 NOTE — ED Provider Notes (Signed)
[facility] Emergency Department Provider Note  ____________________________________________  Time seen: Approximately 1:36 AM  I have reviewed the triage vital signs and the nursing notes.   HISTORY  Chief Complaint Shortness of Breath and Chest Pain   HPI Manuel Richards is a 29 y.o. male with a history of heroin abuse, depression, hepatitis C, ADHD who presents for evaluation of cough and shortness of breath.  Patient reports 3 to 4 days of cough productive of green sputum.  Shortness of breath which is mild and present with ambulation.  Has had my intermittent wheezing.  Has had mild sharp central chest pain that is worse when coughing.  Last heroin use was 2 days ago and patient reports that he snorted it.   Patient reports not using IV drugs for several months and therefore does not want any blood work done due to concern of possible relapsing due to needle usage in ED.  Denies any personal or family history of blood clots, recent travel immobilization, hemoptysis, or exogenous hormones.  Patient denies any fever, any known exposures to Covid, not vaccinated.  No vomiting or diarrhea, no sore throat.  No history of asthma.  Patient is a smoker.  Past Medical History:  Diagnosis Date  . ADHD (attention deficit hyperactivity disorder)   . Anxiety   . Drug use     Patient Active Problem List   Diagnosis Date Noted  . MDD (major depressive disorder), recurrent, severe, with psychosis (HCC) 03/08/2018  . Major depressive disorder, recurrent episode with mixed features (HCC) 11/19/2017  . Sedative, hypnotic or anxiolytic use disorder, severe, dependence (HCC) 08/12/2016  . Hepatitis C 08/12/2016  . Cocaine use disorder, moderate, dependence (HCC) 08/11/2016  . Substance-induced psychotic disorder with hallucinations (HCC) 12/19/2014  . Opioid use disorder, moderate, dependence (HCC) 12/19/2014  . Cannabis use disorder, moderate, dependence (HCC)  12/19/2014  . Tobacco use disorder 12/19/2014    History reviewed. No pertinent surgical history.  Prior to Admission medications   Medication Sig Start Date End Date Taking? Authorizing Provider  albuterol (VENTOLIN HFA) 108 (90 Base) MCG/ACT inhaler Inhale 2 puffs into the lungs every 6 (six) hours as needed for wheezing or shortness of breath. 01/02/20   Nita Sickle, MD  azithromycin [facility]) 250 MG tablet Take 1 a day for 4 days 01/02/20   Don Perking, [place], MD  oxyCODONE-acetaminophen (PERCOCET) 5-325 MG tablet Take 1 tablet by mouth every 4 (four) hours as needed for severe pain. 08/22/19 08/21/20  Willy Eddy, MD  potassium chloride (KLOR-CON) 10 MEQ tablet Take 4 tablets (40 mEq total) by mouth daily for 4 days. 08/28/19 09/01/19  Khatri, Hina, PA-C  predniSONE (DELTASONE) 20 MG tablet Take 3 tablets (60 mg total) by mouth daily for 4 days. 01/02/20 01/06/20  Nita Sickle, MD  QUEtiapine (SEROQUEL XR) 200 MG 24 hr tablet Take 1 tablet (200 mg total) by mouth at bedtime. 03/09/18   Pucilowska, Ellin Goodie, MD    Allergies Penicillins  Family History  Problem Relation Age of Onset  . Bipolar disorder Mother   . Schizophrenia Maternal Grandmother   . Schizophrenia Maternal Uncle     Social History Social History   Tobacco Use  . Smoking status: Current Every Day Smoker    Packs/day: 0.50    Types: Cigarettes  . Smokeless tobacco: Never Used  Vaping Use  . Vaping Use: Never used  Substance Use Topics  . Alcohol use: Yes  . Drug use: Yes  Types: Cocaine, Marijuana, Benzodiazepines, Opium    Review of Systems  Constitutional: Negative for fever. Eyes: Negative for visual changes. ENT: Negative for sore throat. Neck: No neck pain  Cardiovascular: + chest pain. Respiratory: + shortness of breath, cough, wheezing Gastrointestinal: Negative for abdominal pain, vomiting or diarrhea. Genitourinary: Negative for dysuria. Musculoskeletal: Negative for  back pain. Skin: Negative for rash. Neurological: Negative for headaches, weakness or numbness. Psych: No SI or HI  ____________________________________________   PHYSICAL EXAM:  VITAL SIGNS: Vitals:   01/01/20 2158 01/02/20 0115  BP: 134/82 120/68  Pulse: (!) 102 72  Resp: 18 18  Temp: 98 F (36.7 C)   SpO2: 99% 97%    Constitutional: Alert and oriented. Well appearing and in no apparent distress. HEENT:      Head: Normocephalic and atraumatic.         Eyes: Conjunctivae are normal. Sclera is non-icteric.       Mouth/Throat: Mucous membranes are moist.       Neck: Supple with no signs of meningismus. Cardiovascular: Regular rate and rhythm. No murmurs, gallops, or rubs. 2+ symmetrical distal pulses are present in all extremities. No JVD. Respiratory: Normal respiratory effort.  Normal sats, decreased air movement bilaterally with no crackles or wheezing Gastrointestinal: Soft, non tender. Musculoskeletal:  No edema, cyanosis, or erythema of extremities. Neurologic: Normal speech and language. Face is symmetric. Moving all extremities. No gross focal neurologic deficits are appreciated. Skin: Skin is warm, dry and intact. No rash noted. Psychiatric: Mood and affect are normal. Speech and behavior are normal.  ____________________________________________   LABS (all labs ordered are listed, but only abnormal results are displayed)  Labs Reviewed  BASIC METABOLIC PANEL  CBC  TROPONIN I (HIGH SENSITIVITY)  TROPONIN I (HIGH SENSITIVITY)   ____________________________________________  EKG  ED ECG REPORT I, Nita Sickle, the attending physician, personally viewed and interpreted this ECG.  Normal sinus rhythm, rate of 97, normal intervals, normal axis, no ST elevations.  Unchanged from prior from 2019. ____________________________________________  RADIOLOGY  I have personally reviewed the images performed during this visit and I agree with the Radiologist's  read.   Interpretation by Radiologist:  DG Chest 2 View  Result Date: 01/01/2020 CLINICAL DATA:  Chest pain, central chest pain and shortness of breath not resolving for 3-4 days, productive cough EXAM: CHEST - 2 VIEW COMPARISON:  Radiograph 03/03/2016 FINDINGS: Mild airways thickening. No consolidation, features of edema, pneumothorax, or effusion. The cardiomediastinal contours are unremarkable. No acute osseous or soft tissue abnormality. IMPRESSION: Mild airways thickening could reflect a bronchitis or reactive airways disease. Electronically Signed   By: Kreg Shropshire M.D.   On: 01/01/2020 21:46     ____________________________________________   PROCEDURES  Procedure(s) performed: None Procedures Critical Care performed:  None ____________________________________________   INITIAL IMPRESSION / ASSESSMENT AND PLAN / ED COURSE   29 y.o. male with a history of heroin abuse, depression, hepatitis C, ADHD who presents for evaluation of productive cough, shortness of breath, wheezing, and CP.  Ddx bronchitis, pna, viral illness, ptx, pe, pericarditis, myocarditis.  Patient is well-appearing with normal work of breathing, normal sats, normal vital signs, slightly decreased air movement bilaterally but no wheezing or crackles.  Patient is refusing any blood work due to concerns of relapsing to IV drug use.  Explained to the patient that I am unable to rule out PE, myocarditis, or sepsis without blood work.  Patient understands the risks and continues to refuse blood work.  Chest x-ray visualized  by me with no infiltrate, confirmed by radiology who reads as possible bronchitis.  EKG reviewed with no acute ischemic changes.  Will treat with DuoNeb, prednisone and a Z-Pak.  Old medical records reviewed.  _________________________ 2:36 AM on 01/02/2020 -----------------------------------------  Patient reassessed after 1 DuoNeb and feels markedly improved.  Reports shortness of breath and  chest pain have resolved.  Moving good air with no wheezing.  Remains with normal work of breathing normal sats.  Will discharge home with albuterol, Z-Pak, and prednisone.  Discussed my standard return precautions    _____________________________________________ Please note:  Patient was evaluated in Emergency Department today for the symptoms described in the history of present illness. Patient was evaluated in the context of the global COVID-19 pandemic, which necessitated consideration that the patient might be at risk for infection with the SARS-CoV-2 virus that causes COVID-19. Institutional protocols and algorithms that pertain to the evaluation of patients at risk for COVID-19 are in a state of rapid change based on information released by regulatory bodies including the CDC and federal and state organizations. These policies and algorithms were followed during the patient's care in the ED.  Some ED evaluations and interventions may be delayed as a result of limited staffing during the pandemic.   [place] Controlled Substance Database was reviewed by me. ____________________________________________   FINAL CLINICAL IMPRESSION(S) / ED DIAGNOSES   Final diagnoses:  Bronchitis      NEW MEDICATIONS STARTED DURING THIS VISIT:  ED Discharge Orders         Ordered    azithromycin (ZITHROMAX) 250 MG tablet  Status:  Discontinued        01/02/20 0143    predniSONE (DELTASONE) 20 MG tablet  Daily        01/02/20 0143    albuterol (VENTOLIN HFA) 108 (90 Base) MCG/ACT inhaler  Every 6 hours PRN        01/02/20 0143    azithromycin (ZITHROMAX) 250 MG tablet        01/02/20 0147           Note:  This document was prepared using Dragon voice recognition software and may include unintentional dictation errors.    Nita Sickle, MD 01/02/20 [phone]

## 2020-01-02 NOTE — ED Notes (Signed)
Assumed care of pt upon being roomed. Pt in recovery x3 months and refusing blood work due to needle usage. States relapse "a few days ago" with "xanx or fentanyl" PO. +Productive cough with green mucus. Central chest pressure x3-4 days with cough

## 2020-01-20 ENCOUNTER — Other Ambulatory Visit: Payer: Self-pay

## 2020-01-20 ENCOUNTER — Emergency Department
Admission: EM | Admit: 2020-01-20 | Discharge: 2020-01-20 | Disposition: A | Discharge status: Home / Self Care | Payer: Self-pay | Attending: Emergency Medicine | Admitting: Emergency Medicine

## 2020-01-20 DIAGNOSIS — T401X1A Poisoning by heroin, accidental (unintentional), initial encounter: Secondary | ICD-10-CM | POA: Insufficient documentation

## 2020-01-20 DIAGNOSIS — F17210 Nicotine dependence, cigarettes, uncomplicated: Secondary | ICD-10-CM | POA: Insufficient documentation

## 2020-01-20 NOTE — ED Notes (Signed)
Pt speaking on phone in no acute distress.

## 2020-01-20 NOTE — ED Triage Notes (Signed)
Pt states he overdosed on heroin. Pt received 4mg  of intranasal narcan. Pt is currently alert and oriented x4 and denies current complaints.

## 2020-01-20 NOTE — ED Notes (Signed)
Pt eating sandwich. Pt attempting to call for ride for discharge.

## 2020-01-20 NOTE — ED Notes (Signed)
Pt declines d/c vs. Pt ambulatory to lobby in no acute distress to wait for taxi. resps unlabored.

## 2020-01-20 NOTE — ED Notes (Signed)
Pt in hallway bed, talking on phone in no acute distress.

## 2020-01-20 NOTE — ED Notes (Addendum)
Pt states his wife is waiting for him at home. Pt states she does not have a car and can not come and get him. Pt has removed all monitoring equipment.

## 2020-01-20 NOTE — ED Provider Notes (Signed)
[facility] Emergency Department Provider Note ____________________________________________   First MD Initiated Contact with Patient 01/20/20 1955     (approximate)  I have reviewed the triage vital signs and the nursing notes.   HISTORY  Chief Complaint Drug Overdose  HPI Manuel Richards is a 29 y.o. male with history of anxiety and heroin addiction presents to the emergency department for treatment and evaluation after heroin overdose. He was given 4mg  of intranasal Narcan. He is now awake, alert, and oriented. He states that he "feels stupid" because he was supposed to go to an inpatient rehab facility tonight. He would still like to go. He denies SI or HI and states that it was accidental.    Past Medical History:  Diagnosis Date  . ADHD (attention deficit hyperactivity disorder)   . Anxiety   . Drug use     Patient Active Problem List   Diagnosis Date Noted  . MDD (major depressive disorder), recurrent, severe, with psychosis (HCC) 03/08/2018  . Major depressive disorder, recurrent episode with mixed features (HCC) 11/19/2017  . Sedative, hypnotic or anxiolytic use disorder, severe, dependence (HCC) 08/12/2016  . Hepatitis C 08/12/2016  . Cocaine use disorder, moderate, dependence (HCC) 08/11/2016  . Substance-induced psychotic disorder with hallucinations (HCC) 12/19/2014  . Opioid use disorder, moderate, dependence (HCC) 12/19/2014  . Cannabis use disorder, moderate, dependence (HCC) 12/19/2014  . Tobacco use disorder 12/19/2014    No past surgical history on file.  Prior to Admission medications   Medication Sig Start Date End Date Taking? Authorizing Provider  albuterol (VENTOLIN HFA) 108 (90 Base) MCG/ACT inhaler Inhale 2 puffs into the lungs every 6 (six) hours as needed for wheezing or shortness of breath. 01/02/20   01/04/20, MD  azithromycin [facility]) 250 MG tablet Take 1 a day for 4 days 01/02/20   01/04/20,  Don Perking, MD  oxyCODONE-acetaminophen (PERCOCET) 5-325 MG tablet Take 1 tablet by mouth every 4 (four) hours as needed for severe pain. 08/22/19 08/21/20  10/22/20, MD  potassium chloride (KLOR-CON) 10 MEQ tablet Take 4 tablets (40 mEq total) by mouth daily for 4 days. 08/28/19 09/01/19  Khatri, Hina, PA-C  QUEtiapine (SEROQUEL XR) 200 MG 24 hr tablet Take 1 tablet (200 mg total) by mouth at bedtime. 03/09/18   Pucilowska, 03/11/18, MD    Allergies Penicillins  Family History  Problem Relation Age of Onset  . Bipolar disorder Mother   . Schizophrenia Maternal Grandmother   . Schizophrenia Maternal Uncle     Social History Social History   Tobacco Use  . Smoking status: Current Every Day Smoker    Packs/day: 0.50    Types: Cigarettes  . Smokeless tobacco: Never Used  Vaping Use  . Vaping Use: Never used  Substance Use Topics  . Alcohol use: Yes  . Drug use: Yes    Types: Cocaine, Marijuana, Benzodiazepines, Opium    Review of Systems  Constitutional: No fever/chills Eyes: No visual changes. ENT: No sore throat. Cardiovascular: Denies chest pain. Respiratory: Denies shortness of breath. Gastrointestinal: No abdominal pain.  No nausea, no vomiting.  Genitourinary: Negative for dysuria. Musculoskeletal: Negative for back pain. Skin: Negative for rash. Neurological: Negative for headaches, focal weakness or numbness. ____________________________________________   PHYSICAL EXAM:  VITAL SIGNS: ED Triage Vitals [01/20/20 1946]  Enc Vitals Group     BP (!) 172/92     Pulse Rate (!) 58     Resp 18     Temp 97.6 F (  36.4 C)     Temp Source Oral     SpO2 99 %     Weight 125 lb (56.7 kg)     Height 5\' 4"  (1.626 m)     Head Circumference      Peak Flow      Pain Score 0     Pain Loc      Pain Edu?      Excl. in GC?     Constitutional: Alert and oriented. Well appearing and in no acute distress. Eyes: Conjunctivae are normal. PERRL. EOMI. Head: Atraumatic.  Nose: No congestion/rhinnorhea. Mouth/Throat: Mucous membranes are moist.  Neck: No stridor.   Cardiovascular: Normal rate, regular rhythm. Grossly normal heart sounds.  Good peripheral circulation. Respiratory: Normal respiratory effort.  No retractions. Lungs CTAB. Gastrointestinal: Soft and nontender. No distention. No abdominal bruits. Genitourinary:  Musculoskeletal: FROM of extremities. Neurologic:  Normal speech and language. No gross focal neurologic deficits are appreciated. No gait instability. Skin:  Skin is warm, dry and intact. Psychiatric: Mood and affect are normal. Speech and behavior are normal.  ____________________________________________   LABS (all labs ordered are listed, but only abnormal results are displayed)  Labs Reviewed - No data to display ____________________________________________  EKG   ____________________________________________  RADIOLOGY  ED MD interpretation:    N/a  I, , personally viewed and evaluated these images (plain radiographs) as part of my medical decision making, as well as reviewing the written report by the radiologist.  Official radiology report(s): No results found.  ____________________________________________   PROCEDURES  Procedure(s) performed (including Critical Care):  Procedures  ____________________________________________   INITIAL IMPRESSION / ASSESSMENT AND PLAN     30 year old male presenting after heroin overdose. See HPI for further details. He is awake, alert, and oriented at this time without complaints.  ED COURSE  Patient requesting discharge so that he can go to "[facility]" tonight. Plan will be to monitor him for about a half hour, which will be about 1 1/2 hours since administration of Narcan. Will try and get him a food tray in the meantime. His mother has agreed to come get him and drive him to the rehab facility.  Mother now stating that she can't come get  him. He is looking for another way to get to the facility. Care relinquished to Dr. 37.    ___________________________________________   FINAL CLINICAL IMPRESSION(S) / ED DIAGNOSES  Final diagnoses:  Accidental overdose of heroin, initial encounter [facility])     ED Discharge Orders    None       Manuel Richards was evaluated in Emergency Department on 01/21/2020 for the symptoms described in the history of present illness. He was evaluated in the context of the global COVID-19 pandemic, which necessitated consideration that the patient might be at risk for infection with the SARS-CoV-2 virus that causes COVID-19. Institutional protocols and algorithms that pertain to the evaluation of patients at risk for COVID-19 are in a state of rapid change based on information released by regulatory bodies including the CDC and federal and state organizations. These policies and algorithms were followed during the patient's care in the ED.   Note:  This document was prepared using Dragon voice recognition software and may include unintentional dictation errors.   14/06/2019, FNP 01/21/20 1622    14/05/21, MD 01/21/20 2237

## 2020-01-20 NOTE — Discharge Instructions (Signed)
Go to the rehab facility tonight as we discussed.   If you develop symptoms of concern, go to the nearest ER.

## 2020-01-20 NOTE — ED Notes (Signed)
Pt ambulatory around department in no acute distress, out to nurse's station approx every 5 minutes for various requests. Pt will have taxi voucher by [employer] home.

## 2020-03-14 ENCOUNTER — Ambulatory Visit: Payer: Self-pay
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
## 2021-11-10 ENCOUNTER — Other Ambulatory Visit: Payer: Self-pay

## 2021-11-10 ENCOUNTER — Emergency Department: Payer: Self-pay

## 2021-11-10 ENCOUNTER — Emergency Department
Admission: EM | Admit: 2021-11-10 | Discharge: 2021-11-10 | Disposition: A | Discharge status: Home / Self Care | Payer: Self-pay | Attending: Emergency Medicine | Admitting: Emergency Medicine

## 2021-11-10 DIAGNOSIS — Z79899 Other long term (current) drug therapy: Secondary | ICD-10-CM | POA: Insufficient documentation

## 2021-11-10 DIAGNOSIS — R079 Chest pain, unspecified: Secondary | ICD-10-CM

## 2021-11-10 DIAGNOSIS — R7401 Elevation of levels of liver transaminase levels: Secondary | ICD-10-CM | POA: Insufficient documentation

## 2021-11-10 DIAGNOSIS — J209 Acute bronchitis, unspecified: Secondary | ICD-10-CM | POA: Insufficient documentation

## 2021-11-10 DIAGNOSIS — B1920 Unspecified viral hepatitis C without hepatic coma: Secondary | ICD-10-CM | POA: Insufficient documentation

## 2021-11-10 LAB — COMPREHENSIVE METABOLIC PANEL
ALT: 224 U/L — ABNORMAL HIGH (ref 0–44)
AST: 138 U/L — ABNORMAL HIGH (ref 15–41)
Albumin: 4.1 g/dL (ref 3.5–5.0)
Alkaline Phosphatase: 81 U/L (ref 38–126)
Anion gap: 6 (ref 5–15)
BUN: 15 mg/dL (ref 6–20)
CO2: 32 mmol/L (ref 22–32)
Calcium: 9.4 mg/dL (ref 8.9–10.3)
Chloride: 102 mmol/L (ref 98–111)
Creatinine, Ser: 0.65 mg/dL (ref 0.61–1.24)
GFR, Estimated: >60 mL/min (ref >60)
Glucose, Bld: 109 mg/dL — ABNORMAL HIGH (ref 70–99)
Potassium: 4.3 mmol/L (ref 3.5–5.1)
Sodium: 140 mmol/L (ref 135–145)
Total Bilirubin: 0.6 mg/dL (ref 0.3–1.2)
Total Protein: 7.3 g/dL (ref 6.5–8.1)

## 2021-11-10 LAB — URINALYSIS, ROUTINE W REFLEX MICROSCOPIC
Bilirubin Urine: NEGATIVE
Glucose, UA: NEGATIVE mg/dL
Hgb urine dipstick: NEGATIVE
Ketones, ur: NEGATIVE mg/dL
Leukocytes,Ua: NEGATIVE
Nitrite: NEGATIVE
Protein, ur: NEGATIVE mg/dL
Specific Gravity, Urine: 1.019 (ref 1.005–1.030)
pH: 6 (ref 5.0–8.0)

## 2021-11-10 LAB — URINE DRUG SCREEN, QUALITATIVE (ARMC ONLY)
Amphetamines, Ur Screen: NOT DETECTED
Barbiturates, Ur Screen: NOT DETECTED
Benzodiazepine, Ur Scrn: NOT DETECTED
Cannabinoid 50 Ng, Ur ~~LOC~~: POSITIVE — AB
Cocaine Metabolite,Ur ~~LOC~~: NOT DETECTED
MDMA (Ecstasy)Ur Screen: NOT DETECTED
Methadone Scn, Ur: NOT DETECTED
Opiate, Ur Screen: NOT DETECTED
Phencyclidine (PCP) Ur S: NOT DETECTED
Tricyclic, Ur Screen: NOT DETECTED

## 2021-11-10 LAB — CBC WITH DIFFERENTIAL/PLATELET
Abs Immature Granulocytes: 0.02 10*3/uL (ref 0.00–0.07)
Basophils Absolute: 0 10*3/uL (ref 0.0–0.1)
Basophils Relative: 1 %
Eosinophils Absolute: 0.1 10*3/uL (ref 0.0–0.5)
Eosinophils Relative: 2 %
HCT: 41.5 % (ref 39.0–52.0)
Hemoglobin: 13.7 g/dL (ref 13.0–17.0)
Immature Granulocytes: 0 %
Lymphocytes Relative: 46 %
Lymphs Abs: 2.5 10*3/uL (ref 0.7–4.0)
MCH: 30.2 pg (ref 26.0–34.0)
MCHC: 33 g/dL (ref 30.0–36.0)
MCV: 91.6 fL (ref 80.0–100.0)
Monocytes Absolute: 0.5 10*3/uL (ref 0.1–1.0)
Monocytes Relative: 8 %
Neutro Abs: 2.4 10*3/uL (ref 1.7–7.7)
Neutrophils Relative %: 43 %
Platelets: 198 10*3/uL (ref 150–400)
RBC: 4.53 MIL/uL (ref 4.22–5.81)
RDW: 12.1 % (ref 11.5–15.5)
WBC: 5.5 10*3/uL (ref 4.0–10.5)
nRBC: 0 % (ref 0.0–0.2)

## 2021-11-10 LAB — TROPONIN I (HIGH SENSITIVITY): Troponin I (High Sensitivity): <2 ng/L (ref <18)

## 2021-11-10 MED ORDER — GUAIFENESIN ER 600 MG PO TB12
600.0000 mg | ORAL_TABLET | Freq: Two times a day (BID) | ORAL | 0 refills | Status: AC
  Filled 2021-11-10: qty 12, 6d supply, fill #0

## 2021-11-10 MED ORDER — ALBUTEROL SULFATE HFA 108 (90 BASE) MCG/ACT IN AERS
2.0000 | INHALATION_SPRAY | Freq: Four times a day (QID) | RESPIRATORY_TRACT | 0 refills | Status: DC | PRN
  Filled 2021-11-10: qty 6.7, 25d supply, fill #0

## 2021-11-10 NOTE — ED Provider Notes (Signed)
[facility] Provider Note    Event Date/Time   First MD Initiated Contact with Patient 11/10/21 1836     (approximate)   History   Chief Complaint Chest Pain (Pt states he has R chest pain that radiates to his back since this morning. Pt has no cardiac history. )   HPI  Manuel Richards is a 31 y.o. male with past medical history of polysubstance abuse, anxiety, and hepatitis C who presents to the ED complaining of chest pain.  Patient reports that he has had about 1 week of constant pain in the center of his chest that he describes as a dull ache.  He states that it will occasionally radiate towards his back, currently describes it as mild without any pain in his back.  He denies any difficulty breathing, but does state he has been dealing with a productive cough for about a week and a half.  He denies any fevers and has not noticed any pain or swelling in his legs.  He denies any history of similar symptoms.  He has not had any abdominal pain, nausea, or vomiting.     Physical Exam   Triage Vital Signs: ED Triage Vitals  Enc Vitals Group     BP 11/10/21 1627 (!) 127/90     Pulse Rate 11/10/21 1627 73     Resp 11/10/21 1627 18     Temp 11/10/21 1627 97.6 F (36.4 C)     Temp src --      SpO2 11/10/21 1627 100 %     Weight 11/10/21 1628 127 lb (57.6 kg)     Height 11/10/21 1628 5\' 5"  (1.651 m)     Head Circumference --      Peak Flow --      Pain Score 11/10/21 1627 4     Pain Loc --      Pain Edu? --      Excl. in GC? --     Most recent vital signs: Vitals:   11/10/21 1627 11/10/21 2015  BP: (!) 127/90 122/88  Pulse: 73 77  Resp: 18 18  Temp: 97.6 F (36.4 C) 97.8 F (36.6 C)  SpO2: 100% 100%    Constitutional: Alert and oriented. Eyes: Conjunctivae are normal. Head: Atraumatic. Nose: No congestion/rhinnorhea. Mouth/Throat: Mucous membranes are moist.  Cardiovascular: Normal rate, regular rhythm. Grossly normal heart sounds.   2+ radial pulses bilaterally. Respiratory: Normal respiratory effort.  No retractions. Lungs CTAB.  No chest wall tenderness to palpation. Gastrointestinal: Soft and nontender. No distention. Musculoskeletal: No lower extremity tenderness nor edema.  Neurologic:  Normal speech and language. No gross focal neurologic deficits are appreciated.    ED Results / Procedures / Treatments   Labs (all labs ordered are listed, but only abnormal results are displayed) Labs Reviewed  COMPREHENSIVE METABOLIC PANEL - Abnormal; Notable for the following components:      Result Value   Glucose, Bld 109 (*)    AST 138 (*)    ALT 224 (*)    All other components within normal limits  URINALYSIS, ROUTINE W REFLEX MICROSCOPIC - Abnormal; Notable for the following components:   Color, Urine YELLOW (*)    APPearance CLEAR (*)    All other components within normal limits  URINE DRUG SCREEN, QUALITATIVE ([facility] ONLY) - Abnormal; Notable for the following components:   Cannabinoid 50 Ng, Ur [place] POSITIVE (*)    All other components within normal limits  CBC WITH  DIFFERENTIAL/PLATELET  HEPATITIS PANEL, ACUTE  TROPONIN I (HIGH SENSITIVITY)     EKG  ED ECG REPORT I, Blake Divine, the attending physician, personally viewed and interpreted this ECG.   Date: 11/10/2021  EKG Time: 16:25  Rate: 81  Rhythm: normal sinus rhythm  Axis: Normal  Intervals:none  ST&T Change: None  RADIOLOGY Chest x-ray reviewed and interpreted by me with no infiltrate, edema, or effusion.  PROCEDURES:  Critical Care performed: No  Procedures   MEDICATIONS ORDERED IN ED: Medications - No data to display   IMPRESSION / MDM / [place] / ED COURSE  I reviewed the triage vital signs and the nursing notes.                              31 y.o. male with past medical history of polysubstance use, anxiety, and hepatitis C who presents to the ED with about 1 week of constant pain in his chest preceded by a few  days of productive cough.  Patient's presentation is most consistent with acute presentation with potential threat to life or bodily function.  Differential diagnosis includes, but is not limited to, ACS, PE, dissection, pneumonia, pneumothorax, musculoskeletal pain, bronchitis, GERD, and anxiety.  Patient nontoxic-appearing and in no acute distress, vital signs are unremarkable.  EKG shows no evidence of arrhythmia or ischemia and troponin is negative, doubt ACS given symptoms reported for 1 week.  Chest x-ray is unremarkable and symptoms seem atypical for PE or dissection, especially given mild pain currently with reassuring vital signs.  Suspect chest pain is secondary to bronchitis and his frequent cough, will treat with albuterol and Mucinex.  Remainder of labs are significant for mild transaminitis with ALT of 224 and AST of 138, bilirubin and alkaline phosphatase are within normal limits.  No right upper quadrant tenderness, nausea, or vomiting to raise suspicion for biliary pathology.  He does have a history of untreated hepatitis C which may be contributing to this finding.  Findings discussed with Dr. Haig Prophet of GI, who recommends sending off hepatitis panel with outpatient follow-up, presentation not clear enough to start on any medication for hepatitis C.  Patient was counseled to follow-up with GI and [facility]  He was counseled to return to the ED for new or worsening symptoms, patient agrees with plan.      FINAL CLINICAL IMPRESSION(S) / ED DIAGNOSES   Final diagnoses:  Chest pain, unspecified type  Acute bronchitis, unspecified organism  Hepatitis C virus infection without hepatic coma, unspecified chronicity     Rx / DC Orders   ED Discharge Orders          Ordered    albuterol (VENTOLIN HFA) 108 (90 Base) MCG/ACT inhaler  Every 6 hours PRN       Note to Pharmacy: Please supply with spacer   11/10/21 2012    guaiFENesin (MUCINEX) 600 MG  12 hr tablet  2 times daily        11/10/21 2012             Note:  This document was prepared using Dragon voice recognition software and may include unintentional dictation errors.   Blake Divine, MD 11/10/21 2017

## 2021-11-10 NOTE — ED Provider Triage Note (Signed)
Emergency Medicine Provider Triage Evaluation Note  Manuel Richards , a 31 y.o. male  was evaluated in triage.  Pt complains of nonspecific right-sided chest pain today.  Constant in nature.  Not reproducible.  No cardiac history.  Patient has been coughing x2 weeks.  No fevers or chills, sore throat.  No abdominal complaints.  Patient does have polysubstance abuse history.  Review of Systems  Positive: Right-sided chest pain, cough Negative: Fevers, chills, shortness of breath, GI complaints  Physical Exam  There were no vitals taken for this visit. Gen:   Awake, no distress   Resp:  Normal effort  MSK:   Moves extremities without difficulty  Other:    Medical Decision Making  Medically screening exam initiated at 4:28 PM.  Appropriate orders placed.  Manuel Richards was informed that the remainder of the evaluation will be completed by another provider, this initial triage assessment does not replace that evaluation, and the importance of remaining in the ED until their evaluation is complete.  Patient had labs, EKG, chest x-ray, UDS for right-sided chest pain.   Darletta Moll, PA-C 11/10/21 1628

## 2021-11-11 ENCOUNTER — Other Ambulatory Visit: Payer: Self-pay

## 2021-11-11 LAB — HEPATITIS PANEL, ACUTE
HCV Ab: REACTIVE — AB
Hep A IgM: NONREACTIVE
Hep B C IgM: NONREACTIVE
Hepatitis B Surface Ag: NONREACTIVE

## 2021-12-02 ENCOUNTER — Other Ambulatory Visit: Payer: Self-pay

## 2022-03-08 ENCOUNTER — Other Ambulatory Visit: Payer: Self-pay

## 2022-03-08 ENCOUNTER — Emergency Department: Payer: Medicaid Other

## 2022-03-08 ENCOUNTER — Encounter: Payer: Self-pay | Admitting: Emergency Medicine

## 2022-03-08 ENCOUNTER — Emergency Department
Admission: EM | Admit: 2022-03-08 | Discharge: 2022-03-08 | Disposition: A | Discharge status: Home / Self Care | Payer: Medicaid Other | Attending: Emergency Medicine | Admitting: Emergency Medicine

## 2022-03-08 DIAGNOSIS — I700 Atherosclerosis of aorta: Secondary | ICD-10-CM | POA: Insufficient documentation

## 2022-03-08 DIAGNOSIS — K5901 Slow transit constipation: Secondary | ICD-10-CM | POA: Insufficient documentation

## 2022-03-08 DIAGNOSIS — R1084 Generalized abdominal pain: Secondary | ICD-10-CM

## 2022-03-08 DIAGNOSIS — R1031 Right lower quadrant pain: Secondary | ICD-10-CM | POA: Diagnosis present

## 2022-03-08 LAB — COMPREHENSIVE METABOLIC PANEL
ALT: 169 U/L — ABNORMAL HIGH (ref 0–44)
AST: 118 U/L — ABNORMAL HIGH (ref 15–41)
Albumin: 4.5 g/dL (ref 3.5–5.0)
Alkaline Phosphatase: 78 U/L (ref 38–126)
Anion gap: 8 (ref 5–15)
BUN: 18 mg/dL (ref 6–20)
CO2: 29 mmol/L (ref 22–32)
Calcium: 9.4 mg/dL (ref 8.9–10.3)
Chloride: 100 mmol/L (ref 98–111)
Creatinine, Ser: 0.81 mg/dL (ref 0.61–1.24)
GFR, Estimated: >60 mL/min (ref >60)
Glucose, Bld: 97 mg/dL (ref 70–99)
Potassium: 3.6 mmol/L (ref 3.5–5.1)
Sodium: 137 mmol/L (ref 135–145)
Total Bilirubin: 0.5 mg/dL (ref 0.3–1.2)
Total Protein: 7.6 g/dL (ref 6.5–8.1)

## 2022-03-08 LAB — CBC
HCT: 44.8 % (ref 39.0–52.0)
Hemoglobin: 15 g/dL (ref 13.0–17.0)
MCH: 30.9 pg (ref 26.0–34.0)
MCHC: 33.5 g/dL (ref 30.0–36.0)
MCV: 92.2 fL (ref 80.0–100.0)
Platelets: 206 10*3/uL (ref 150–400)
RBC: 4.86 MIL/uL (ref 4.22–5.81)
RDW: 11.7 % (ref 11.5–15.5)
WBC: 6 10*3/uL (ref 4.0–10.5)
nRBC: 0 % (ref 0.0–0.2)

## 2022-03-08 LAB — URINALYSIS, ROUTINE W REFLEX MICROSCOPIC
Bilirubin Urine: NEGATIVE
Glucose, UA: NEGATIVE mg/dL
Hgb urine dipstick: NEGATIVE
Ketones, ur: NEGATIVE mg/dL
Leukocytes,Ua: NEGATIVE
Nitrite: NEGATIVE
Protein, ur: NEGATIVE mg/dL
Specific Gravity, Urine: 1.027 (ref 1.005–1.030)
pH: 5 (ref 5.0–8.0)

## 2022-03-08 LAB — LIPASE, BLOOD: Lipase: 35 U/L (ref 11–51)

## 2022-03-08 MED ORDER — KETOROLAC TROMETHAMINE 30 MG/ML IJ SOLN
30.0000 mg | Freq: Once | INTRAMUSCULAR | Status: AC
  Administered 2022-03-08: 30 mg via INTRAMUSCULAR
  Filled 2022-03-08: qty 1

## 2022-03-08 NOTE — ED Provider Notes (Signed)
[facility] Provider Note    Event Date/Time   First MD Initiated Contact with Patient 03/08/22 1117     (approximate)   History   Abdominal Pain   HPI  Manuel Richards is a 32 y.o. male who presents with complaints of abdominal pain.  Patient reports he has been struggling with constipation over the last 3 months, over the last 2 days he has developed pain in his right lower quadrant which is new.  He reports it is worse when moving.  No vomiting, no fevers, no dysuria     Physical Exam   Triage Vital Signs: ED Triage Vitals  Enc Vitals Group     BP 03/08/22 1110 (!) 159/95     Pulse Rate 03/08/22 1110 70     Resp 03/08/22 1110 18     Temp 03/08/22 1110 98.5 F (36.9 C)     Temp Source 03/08/22 1110 Oral     SpO2 03/08/22 1110 100 %     Weight 03/08/22 1110 49.9 kg (110 lb)     Height 03/08/22 1118 1.651 m (5\' 5" )     Head Circumference --      Peak Flow --      Pain Score 03/08/22 1110 2     Pain Loc --      Pain Edu? --      Excl. in [place]? --     Most recent vital signs: Vitals:   03/08/22 1110 03/08/22 1253  BP: (!) 159/95 (!) 144/90  Pulse: 70 72  Resp: 18 18  Temp: 98.5 F (36.9 C) 98.3 F (36.8 C)  SpO2: 100% 100%     General: Awake, no distress.  CV:  Good peripheral perfusion.  Resp:  Normal effort.  Abd:  No distention.  Mild tenderness in the right lower quadrant, no CVA tenderness Other:     ED Results / Procedures / Treatments   Labs (all labs ordered are listed, but only abnormal results are displayed) Labs Reviewed  COMPREHENSIVE METABOLIC PANEL - Abnormal; Notable for the following components:      Result Value   AST 118 (*)    ALT 169 (*)    All other components within normal limits  URINALYSIS, ROUTINE W REFLEX MICROSCOPIC - Abnormal; Notable for the following components:   Color, Urine YELLOW (*)    APPearance CLEAR (*)    All other components within normal limits  LIPASE, BLOOD  CBC      EKG     RADIOLOGY CT scan viewed interpreted by me, no ureterolithiasis noted    PROCEDURES:  Critical Care performed:   Procedures   MEDICATIONS ORDERED IN ED: Medications  ketorolac (TORADOL) 30 MG/ML injection 30 mg (30 mg Intramuscular Given 03/08/22 1128)     IMPRESSION / MDM / [place] / ED COURSE  I reviewed the triage vital signs and the nursing notes. Patient's presentation is most consistent with acute presentation with potential threat to life or bodily function.   Patient presents with right lower quadrant abdominal pain as detailed above, differential includes constipation, appendicitis, ureterolithiasis  Will treat with IM Toradol, obtain CT renal stone study, labs reviewed overall reassuring at this time, normal white blood cell count.  CT scan without acute abnormalities.  Discussed need for smoking cessation given aortic plaque noted, suspect his pain is related to his constipation, recommendations given for this.  He has medication already prescribed for him by his PCP  He  request referral for hepatitis C treatment, GI referral placed  Appropriate for discharge at this time, no indication for admission     FINAL CLINICAL IMPRESSION(S) / ED DIAGNOSES   Final diagnoses:  Generalized abdominal pain  Slow transit constipation     Rx / DC Orders   ED Discharge Orders     None        Note:  This document was prepared using Dragon voice recognition software and may include unintentional dictation errors.   Lavonia Drafts, MD 03/08/22 [phone]

## 2022-03-08 NOTE — ED Triage Notes (Signed)
Pt sts that pt has been having lower right abd pain. Pt sts that he has not had a good bowel movement in the last three months. Pt has attempted OTC meds with out any production. 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

## 2022-03-11 ENCOUNTER — Telehealth: Payer: Self-pay | Admitting: Gastroenterology

## 2022-03-11 NOTE — Telephone Encounter (Signed)
Call pt and sche when may sched opens

## 2022-03-13 NOTE — Telephone Encounter (Signed)
Error

## 2022-07-20 ENCOUNTER — Other Ambulatory Visit: Payer: Self-pay

## 2022-12-04 ENCOUNTER — Ambulatory Visit: Payer: MEDICAID | Admitting: Family Medicine

## 2022-12-04 ENCOUNTER — Encounter: Payer: Self-pay | Admitting: Family Medicine

## 2022-12-04 VITALS — BP 114/78 | HR 79 | Temp 97.9°F | Resp 16 | Ht 65.0 in | Wt 105.5 lb

## 2022-12-04 DIAGNOSIS — F17211 Nicotine dependence, cigarettes, in remission: Secondary | ICD-10-CM

## 2022-12-04 DIAGNOSIS — J069 Acute upper respiratory infection, unspecified: Secondary | ICD-10-CM

## 2022-12-04 DIAGNOSIS — I700 Atherosclerosis of aorta: Secondary | ICD-10-CM

## 2022-12-04 DIAGNOSIS — M51369 Other intervertebral disc degeneration, lumbar region without mention of lumbar back pain or lower extremity pain: Secondary | ICD-10-CM

## 2022-12-04 DIAGNOSIS — M5126 Other intervertebral disc displacement, lumbar region: Secondary | ICD-10-CM | POA: Diagnosis not present

## 2022-12-04 DIAGNOSIS — K5909 Other constipation: Secondary | ICD-10-CM | POA: Diagnosis not present

## 2022-12-04 DIAGNOSIS — Z7689 Persons encountering health services in other specified circumstances: Secondary | ICD-10-CM

## 2022-12-04 DIAGNOSIS — Z8619 Personal history of other infectious and parasitic diseases: Secondary | ICD-10-CM

## 2022-12-04 MED ORDER — DOCUSATE SODIUM 100 MG PO CAPS: ORAL_CAPSULE | ORAL | 5 refills | Status: AC

## 2022-12-04 MED ORDER — NAPROXEN 500 MG PO TABS: 500.0000 mg | ORAL_TABLET | Freq: Two times a day (BID) | ORAL | 0 refills | Status: DC

## 2022-12-04 NOTE — Assessment & Plan Note (Signed)
Confirmed with negative HCV quant x2, on 08/06/2022 and 11/06/2022

## 2022-12-04 NOTE — Assessment & Plan Note (Addendum)
Counseled on proper body mechanics. Will start patient on scheduled naproxen for anti-inflammatory benefit.  Advised him this is not a long-term solution for his pain.   Also encouraged patient to start home exercises to strengthen/support his back.

## 2022-12-04 NOTE — Progress Notes (Signed)
New patient visit   Patient: Manuel Richards   DOB: 1991/01/08   32 y.o. Male  MRN: [id number] Visit Date: 12/04/2022  Today's healthcare provider: Sherlyn Hay, DO   Chief Complaint  Patient presents with   Establish Care    6 mo ago received treatment for Hep C  Woke up 3-4 days ago with sore throat and coughing up some mucus    Subjective    Manuel Richards is a 32 y.o. male who presents today as a new patient to establish care.  HPI HPI     Establish Care    Additional comments: 6 mo ago received treatment for Hep C  Woke up 3-4 days ago with sore throat and coughing up some mucus       Last edited by Clois Comber on 12/04/2022  3:46 PM.      Sore throat for past four days  - irregular, mildly productive cough  - no sick contacts  History of severe depression, with previous suicidal ideation  - Previously on oxcarbazepine 300 mg twice daily, risperidone 1 mg twice daily and trazodone 100 mg nightly.  History of polysubstance abuse, IV heroin, cannabis, opioid, benzodiazepines  - now on Suboxone, managed by Judee Clara  - Clean for 2 years  History of chronic hepatitis C  - Managed by gastroenterology  - Finished 12-week course of Epclusa  - Course confirmed to be successful (Hepatitis C cured)  CT renal done 03/08/2022 shows mild broad-based disc bulge at L5-S1 and aortic atherosclerosis   Stool is very hard - small, balls  - trying psyllium husk - takes once daily  - miralax daily did not help  - thinks he might drink a good amount of water, but he has historically been told he's dehydrated.  - usually 2-3 days, at longest 5-6 days.  - drinks at least two 16.9 oz water bottles per day.  Not taking meds for back pain  Works 3rd shift at [employer].   Past Medical History:  Diagnosis Date   ADHD (attention deficit hyperactivity disorder)    Allergy    Anxiety    Drug use    No past surgical history on file. Family Status   Relation Name Status   Mother  (Not Specified)   MGM  (Not Specified)   Mat Uncle  (Not Specified)  No partnership data on file   Family History  Problem Relation Age of Onset   Bipolar disorder Mother    Schizophrenia Maternal Grandmother    Schizophrenia Maternal Uncle    Social History   Socioeconomic History   Marital status: Married    Spouse name: Not on file   Number of children: 2   Years of education: Not on file   Highest education level: Not on file  Occupational History   Occupation: n/a  Tobacco Use   Smoking status: Former    Types: Cigarettes    Passive exposure: Current   Smokeless tobacco: Never  Vaping Use   Vaping status: Never Used  Substance and Sexual Activity   Alcohol use: Yes   Drug use: Yes    Types: Cocaine, Marijuana, Benzodiazepines, Opium   Sexual activity: Yes    Birth control/protection: Condom  Other Topics Concern   Not on file  Social History Narrative   Not on file   Social Determinants of Health   Financial Resource Strain: High Risk (11/19/2017)   Overall Financial Resource Strain (CARDIA)  Difficulty of Paying Living Expenses: Very hard  Food Insecurity: Food Insecurity Present (11/19/2017)   Hunger Vital Sign    Worried About Running Out of Food in the Last Year: Often true    Ran Out of Food in the Last Year: Often true  Transportation Needs: Unmet Transportation Needs (11/19/2017)   PRAPARE - Administrator, Civil Service (Medical): Yes    Lack of Transportation (Non-Medical): Yes  Physical Activity: Not on file  Stress: Not on file  Social Connections: Not on file   Outpatient Medications Prior to Visit  Medication Sig   buprenorphine-naloxone (SUBOXONE) 8-2 mg SUBL SL tablet Place under the tongue.   traZODone (DESYREL) 100 MG tablet Take 100 mg by mouth at bedtime.   [DISCONTINUED] albuterol (VENTOLIN HFA) 108 (90 Base) MCG/ACT inhaler Inhale 2 puffs into the lungs every 6 (six) hours as needed for  wheezing or shortness of breath. (Patient not taking: Reported on 12/04/2022)   [DISCONTINUED] azithromycin (ZITHROMAX) 250 MG tablet Take 1 a day for 4 days (Patient not taking: Reported on 12/04/2022)   [DISCONTINUED] potassium chloride (KLOR-CON) 10 MEQ tablet Take 4 tablets (40 mEq total) by mouth daily for 4 days.   [DISCONTINUED] QUEtiapine (SEROQUEL XR) 200 MG 24 hr tablet Take 1 tablet (200 mg total) by mouth at bedtime. (Patient not taking: Reported on 12/04/2022)   No facility-administered medications prior to visit.   Allergies  Allergen Reactions   Penicillins Other (See Comments)    Reaction:  Unknown; childhood reaction      There is no immunization history on file for this patient.  Health Maintenance  Topic Date Due   DTaP/Tdap/Td (1 - Tdap) Never done   INFLUENZA VACCINE  05/17/2023 (Originally 09/17/2022)   COVID-19 Vaccine (1 - 2023-24 season) 05/17/2023 (Originally 10/18/2022)   Hepatitis C Screening  Completed   HIV Screening  Completed   HPV VACCINES  Aged Out    Patient Care Team: Rianna Lukes, Monico Blitz, DO as PCP - General (Family Medicine)  Review of Systems  Constitutional:  Negative for chills and fever.  HENT:  Positive for sore throat. Negative for congestion, ear pain, sinus pressure and sinus pain.   Eyes:  Negative for visual disturbance.  Respiratory:  Positive for cough. Negative for shortness of breath and wheezing.   Cardiovascular:  Negative for chest pain, palpitations and leg swelling.  Gastrointestinal:  Positive for constipation (chronic). Negative for diarrhea, nausea and vomiting.  Neurological:  Negative for weakness and headaches.        Objective    BP 114/78 (BP Location: Left Arm, Patient Position: Sitting, Cuff Size: Normal)   Pulse 79   Temp 97.9 F (36.6 C)   Resp 16   Ht 5\' 5"  (1.651 m)   Wt 105 lb 8 oz (47.9 kg)   SpO2 100%   BMI 17.56 kg/m     Physical Exam Vitals and nursing note reviewed.  Constitutional:       General: He is not in acute distress.    Appearance: Normal appearance.  HENT:     Head: Normocephalic and atraumatic.  Eyes:     General: No scleral icterus.    Conjunctiva/sclera: Conjunctivae normal.  Cardiovascular:     Rate and Rhythm: Normal rate and regular rhythm.  Pulmonary:     Effort: Pulmonary effort is normal. No respiratory distress.     Breath sounds: Normal breath sounds. No stridor. No wheezing.  Neurological:     Mental Status:  He is alert and oriented to person, place, and time. Mental status is at baseline.  Psychiatric:        Mood and Affect: Mood normal.        Behavior: Behavior normal.     Depression Screen    12/04/2022    3:45 PM  PHQ 2/9 Scores  PHQ - 2 Score 0   No results found for any visits on 12/04/22.  Assessment & Plan     Bulging lumbar disc Assessment & Plan: Counseled on proper body mechanics. Will start patient on scheduled naproxen for anti-inflammatory benefit.  Advised him this is not a long-term solution for his pain.   Also encouraged patient to start home exercises to strengthen/support his back.  Orders: -     Naproxen; Take 1 tablet (500 mg total) by mouth 2 (two) times daily with a meal.  Dispense: 14 tablet; Refill: 0  Establishing care with new doctor, encounter for  History of hepatitis C Assessment & Plan: Confirmed with negative HCV quant x2, on 08/06/2022 and 11/06/2022   Chronic constipation Assessment & Plan: Start colace 1 cap in AM and 2 cap in PM. Consider linzess in the future if ineffective.  Orders: -     Docusate Sodium; Take one capsule in the morning, take two capsules at night, each with a full glass of water.  Dispense: 60 capsule; Refill: 5  Cigarette nicotine dependence in remission  Aortic atherosclerosis (HCC) Assessment & Plan: Noted.  No acute concerns.   Upper respiratory infection, viral Assessment & Plan: Counseled patient on supportive treatment, including getting plenty of rest  and hydration. Counseled regarding signs and symptoms of viral and bacterial respiratory infections. Advised to call or return for additional evaluation if he develops any sign of bacterial infection, or if current symptoms last longer than 10 days.      Patient preferred to defer blood work.   Return in about 4 weeks (around 01/01/2023) for Back, constipation.     I discussed the assessment and treatment plan with the patient  The patient was provided an opportunity to ask questions and all were answered. The patient agreed with the plan and demonstrated an understanding of the instructions.   The patient was advised to call back or seek an in-person evaluation if the symptoms worsen or if the condition fails to improve as anticipated.    Sherlyn Hay, DO  [facility] [phone] (phone) [phone] (fax)  [facility]

## 2022-12-04 NOTE — Assessment & Plan Note (Addendum)
Start colace 1 cap in AM and 2 cap in PM. Consider linzess in the future if ineffective.

## 2022-12-14 ENCOUNTER — Encounter: Payer: Self-pay | Admitting: Family Medicine

## 2022-12-14 DIAGNOSIS — J069 Acute upper respiratory infection, unspecified: Secondary | ICD-10-CM | POA: Insufficient documentation

## 2022-12-14 NOTE — Assessment & Plan Note (Signed)
Noted.  No acute concerns.

## 2022-12-14 NOTE — Assessment & Plan Note (Signed)
Counseled patient on supportive treatment, including getting plenty of rest and hydration. Counseled regarding signs and symptoms of viral and bacterial respiratory infections. Advised to call or return for additional evaluation if he develops any sign of bacterial infection, or if current symptoms last longer than 10 days.

## 2023-01-04 ENCOUNTER — Ambulatory Visit: Payer: MEDICAID | Admitting: Family Medicine

## 2023-01-04 NOTE — Progress Notes (Deleted)
      Established patient visit   Patient: Manuel Richards   DOB: 1991-02-02   32 y.o. Male  MRN: [id number] Visit Date: 01/04/2023  Today's healthcare provider: Sherlyn Hay, DO   No chief complaint on file.  Subjective    HPI .hpisecconsentabridge Follow-up for constipation Back pain -started naproxen at previous visit and counseled patient on body mechanics; encouraged him to start home exercises    ***  {History (Optional):23778}  Medications: Outpatient Medications Prior to Visit  Medication Sig   buprenorphine-naloxone (SUBOXONE) 8-2 mg SUBL SL tablet Place under the tongue.   docusate sodium (COLACE) 100 MG capsule Take one capsule in the morning, take two capsules at night, each with a full glass of water.   naproxen (NAPROSYN) 500 MG tablet Take 1 tablet (500 mg total) by mouth 2 (two) times daily with a meal.   traZODone (DESYREL) 100 MG tablet Take 100 mg by mouth at bedtime.   No facility-administered medications prior to visit.    Review of Systems  ***  {Insert previous labs (optional):23779} {See past labs  Heme  Chem  Endocrine  Serology  Results Review (optional):1}   Objective    There were no vitals taken for this visit. {Insert last BP/Wt (optional):23777}{See vitals history (optional):1}   Physical Exam  .pesec  No results found for any visits on 01/04/23.  Assessment & Plan    There are no diagnoses linked to this encounter. Marland Kitchenapsecabridge  ***  No follow-ups on file.      I discussed the assessment and treatment plan with the patient  The patient was provided an opportunity to ask questions and all were answered. The patient agreed with the plan and demonstrated an understanding of the instructions.   The patient was advised to call back or seek an in-person evaluation if the symptoms worsen or if the condition fails to improve as anticipated.    Sherlyn Hay, DO  [facility] [phone] (phone) [phone] (fax)  [facility]

## 2023-02-24 ENCOUNTER — Ambulatory Visit: Payer: MEDICAID | Admitting: Family Medicine

## 2023-02-24 VITALS — BP 122/69 | HR 68 | Resp 18 | Ht 65.0 in | Wt 109.3 lb

## 2023-02-24 DIAGNOSIS — G479 Sleep disorder, unspecified: Secondary | ICD-10-CM

## 2023-02-24 DIAGNOSIS — M545 Low back pain, unspecified: Secondary | ICD-10-CM

## 2023-02-24 DIAGNOSIS — K5909 Other constipation: Secondary | ICD-10-CM | POA: Diagnosis not present

## 2023-02-24 DIAGNOSIS — G8929 Other chronic pain: Secondary | ICD-10-CM

## 2023-02-24 DIAGNOSIS — M51379 Other intervertebral disc degeneration, lumbosacral region without mention of lumbar back pain or lower extremity pain: Secondary | ICD-10-CM

## 2023-02-24 DIAGNOSIS — F17211 Nicotine dependence, cigarettes, in remission: Secondary | ICD-10-CM

## 2023-02-24 MED ORDER — TRAZODONE HCL 50 MG PO TABS: 50.0000 mg | ORAL_TABLET | Freq: Every evening | ORAL | 3 refills | Status: DC | PRN

## 2023-02-24 NOTE — Progress Notes (Signed)
 Established patient visit   Patient: Manuel Richards   DOB: [birth year]   32 y.o. Male  MRN: [id number] Visit Date: 02/24/2023  Today's healthcare provider: LAURAINE LOISE BUOY, DO   Chief Complaint  Patient presents with   Constipation    improving   Back Pain   Subjective    HPI The patient, with a history of chronic constipation and back pain, presents for a follow-up visit. He reports an improvement in constipation frequency, with bowel movements occurring approximately every other day, up from previous reports. However, the patient notes that the stool consistency remains hard, albeit more substantial and frequent. He continues to take Colace as prescribed, one in the morning and two at night.  The patient also reports persistent back pain, described as a dull ache located in the lower back region. The pain is constant, with occasional flare-ups, and does not radiate down the legs. The patient has tried a five-day course of naproxen  without any noticeable change in pain levels. He has also been performing exercises, including sit-ups, to strengthen his back, which has resulted in a slight improvement in symptoms.  The patient has been taking trazodone  100mg  for sleep, which he reports makes him feel tired throughout the following day. He expresses a desire to try a different medication to avoid this side effect, but is open to reducing the dose of trazodone  as an initial step.  The patient has a history of smoking but has recently quit. He reports noticing a difference in his breathing and cough since quitting. However, he also notes that living with a spouse who smokes presents a challenge to maintaining abstinence.  The patient works a physically demanding job at [employer], which he believes may contribute to his back pain. He has been making efforts to use proper lifting techniques and has been performing exercises to strengthen his back. Despite these efforts, the back pain  persists. He denies presence of any numbness/tingling.  The patient's diet primarily consists of home-cooked meals and fast food from [employer] due to work. He has not made any significant dietary changes recently. He reports an increase in water intake to approximately three bottles per day. He also has fiber pills at home, which he has not been taking regularly.  The patient has a history of a mild broad-based bulging disc at L5 to S1, which was identified on a previous renal CT scan. He expresses interest in pursuing physical therapy and chiropractic care for his back pain.      Medications: Outpatient Medications Prior to Visit  Medication Sig   buprenorphine-naloxone (SUBOXONE) 8-2 mg SUBL SL tablet Place under the tongue.   [DISCONTINUED] traZODone  (DESYREL ) 100 MG tablet Take 100 mg by mouth at bedtime.   docusate sodium  (COLACE) 100 MG capsule Take one capsule in the morning, take two capsules at night, each with a full glass of water. (Patient not taking: Reported on 02/24/2023)   [DISCONTINUED] naproxen  (NAPROSYN ) 500 MG tablet Take 1 tablet (500 mg total) by mouth 2 (two) times daily with a meal.   No facility-administered medications prior to visit.        Objective    BP 122/69   Pulse 68   Resp 18   Ht 5' 5 (1.651 m)   Wt 109 lb 4.8 oz (49.6 kg)   SpO2 100%   BMI 18.19 kg/m     Physical Exam Vitals and nursing note reviewed.  Constitutional:  General: He is not in acute distress.    Appearance: Normal appearance.  HENT:     Head: Normocephalic and atraumatic.  Eyes:     General: No scleral icterus.    Conjunctiva/sclera: Conjunctivae normal.  Cardiovascular:     Rate and Rhythm: Normal rate.  Pulmonary:     Effort: Pulmonary effort is normal.  Neurological:     Mental Status: He is alert and oriented to person, place, and time. Mental status is at baseline.  Psychiatric:        Mood and Affect: Mood normal.        Behavior: Behavior normal.       No results found for any visits on 02/24/23.  Assessment & Plan    Chronic constipation Assessment & Plan: Constipation improving with Colace (docusate sodium ). Bowel movements are more frequent but still hard. Patient is physically active and has increased water intake. Discussed Miralax, but patient prefers to avoid daily use due to concerns about dependency. - Continue Colace, 1 in the morning and 2 at night - Increase fiber intake, consider fiber pills or Metamucil - Increase water intake - Avoid taking Colace with calcium-containing products   Chronic right-sided low back pain without sciatica Assessment & Plan: Chronic low back pain with bulging disc at L5-S1. Pain is dull and localized without radiation. Previous naproxen  trial was ineffective. Discussed physical therapy and chiropractic care. Patient prefers to avoid surgery due to uncertain outcomes and potential risks. - Refer to physical therapy at [facility] - Refer to [facility] in [place] - Order lumbar spine and pelvis x-rays  - Monitor response to physical therapy and chiropractic care - Patient to keep notes on back pain symptoms and triggers.  Orders: -     Ambulatory referral to Physical Therapy -     Ambulatory referral to Chiropractic -     DG Lumbar Spine 2-3 Views; Future -     DG Pelvis 1-2 Views; Future  Degenerative disc disease at L5-S1 level Assessment & Plan: Addressed as noted above.  Orders: -     Ambulatory referral to Physical Therapy -     Ambulatory referral to Chiropractic -     DG Lumbar Spine 2-3 Views; Future -     DG Pelvis 1-2 Views; Future  Sleep disturbance Assessment & Plan: Insomnia managed with trazodone  100 mg, but causing daytime drowsiness. Discussed reducing dose to 50-75 mg to mitigate side effects. Limited alternative options due to addiction potential of medications like Ambien, which also has a high risk of causing vivid dreams.  Patient prefers to avoid controlled substances. - Reduce trazodone  to 50-75 mg - Send prescription for trazodone  to [facility] on [address] in [place]  Orders: -     traZODone  HCl; Take 1-1.5 tablets (50-75 mg total) by mouth at bedtime as needed for sleep.  Dispense: 45 tablet; Refill: 3  Cigarette nicotine  dependence in remission Assessment & Plan: Patient has quit smoking but has had intermittent relapses. Discussed the importance of maintaining smoking cessation for overall health. - Encourage continued smoking cessation    Return in about 2 months (around 04/26/2023) for CPE, back.      I discussed the assessment and treatment plan with the patient  The patient was provided an opportunity to ask questions and all were answered. The patient agreed with the plan and demonstrated an understanding of the instructions.   The patient was advised to call back or seek an in-person evaluation  if the symptoms worsen or if the condition fails to improve as anticipated.    LAURAINE LOISE BUOY, DO  [facility] [phone] (phone) [phone] (fax)  [facility]

## 2023-03-02 ENCOUNTER — Encounter: Payer: Self-pay | Admitting: Family Medicine

## 2023-03-02 DIAGNOSIS — M545 Low back pain, unspecified: Secondary | ICD-10-CM | POA: Insufficient documentation

## 2023-03-02 DIAGNOSIS — M51379 Other intervertebral disc degeneration, lumbosacral region without mention of lumbar back pain or lower extremity pain: Secondary | ICD-10-CM | POA: Insufficient documentation

## 2023-03-02 DIAGNOSIS — G479 Sleep disorder, unspecified: Secondary | ICD-10-CM | POA: Insufficient documentation

## 2023-03-02 NOTE — Assessment & Plan Note (Addendum)
 Constipation improving with Colace (docusate sodium ). Bowel movements are more frequent but still hard. Patient is physically active and has increased water intake. Discussed Miralax, but patient prefers to avoid daily use due to concerns about dependency. - Continue Colace, 1 in the morning and 2 at night - Increase fiber intake, consider fiber pills or Metamucil - Increase water intake - Avoid taking Colace with calcium-containing products

## 2023-03-02 NOTE — Assessment & Plan Note (Signed)
 Insomnia managed with trazodone  100 mg, but causing daytime drowsiness. Discussed reducing dose to 50-75 mg to mitigate side effects. Limited alternative options due to addiction potential of medications like Ambien, which also has a high risk of causing vivid dreams. Patient prefers to avoid controlled substances. - Reduce trazodone  to 50-75 mg - Send prescription for trazodone  to [facility] on [address] in [place]

## 2023-03-02 NOTE — Assessment & Plan Note (Signed)
 Patient has quit smoking but has had intermittent relapses. Discussed the importance of maintaining smoking cessation for overall health. - Encourage continued smoking cessation

## 2023-03-02 NOTE — Assessment & Plan Note (Signed)
 Addressed as noted above.

## 2023-03-02 NOTE — Assessment & Plan Note (Addendum)
 Chronic low back pain with bulging disc at L5-S1. Pain is dull and localized without radiation. Previous naproxen  trial was ineffective. Discussed physical therapy and chiropractic care. Patient prefers to avoid surgery due to uncertain outcomes and potential risks. - Refer to physical therapy at [facility] - Refer to [facility] in [place] - Order lumbar spine and pelvis x-rays  - Monitor response to physical therapy and chiropractic care - Patient to keep notes on back pain symptoms and triggers.

## 2023-04-26 ENCOUNTER — Ambulatory Visit (INDEPENDENT_AMBULATORY_CARE_PROVIDER_SITE_OTHER): Payer: MEDICAID | Admitting: Family Medicine

## 2023-04-26 VITALS — BP 119/72 | HR 59 | Temp 97.5°F | Ht 65.0 in | Wt 108.0 lb

## 2023-04-26 DIAGNOSIS — G479 Sleep disorder, unspecified: Secondary | ICD-10-CM

## 2023-04-26 DIAGNOSIS — F339 Major depressive disorder, recurrent, unspecified: Secondary | ICD-10-CM

## 2023-04-26 MED ORDER — TRAZODONE HCL 100 MG PO TABS: 100.0000 mg | ORAL_TABLET | Freq: Every day | ORAL | 1 refills | Status: DC

## 2023-04-26 NOTE — Progress Notes (Signed)
 Established patient visit   Patient: Manuel Richards   DOB: 13-Dec-1990   33 y.o. Male  MRN: [id number] Visit Date: 04/26/2023  Today's healthcare provider: Sherlyn Hay, DO   Chief Complaint  Patient presents with   Sleep Disturbance    When the patient was last seen his Trazodone was changed to 50 mg 1.5 q hs instead of 100 mg.  Patient states he would like to go back to 100 mg nightly   Subjective    HPI The patient presents with ongoing pain management issues and medication concerns.  He experiences ongoing pain management issues and has difficulty arranging physical therapy appointments due to his early closing times. As a result, he has sought chiropractic care, which he finds beneficial, although he still has some days of pain. He has arranged a cash payment plan with the chiropractor after exhausting Medicaid-covered options.  He has concerns about his medication regimen, specifically that the current dosage of 50 mg is insufficient, leading him to take additional doses. The dosage was previously reduced due to fatigue, but he feels tired regardless of the dosage. He prefers to return to the previous higher dosage as it was more effective.  He uses [facility] for his prescriptions and requests to remove other pharmacy options from his records to avoid confusion.   Dr .Pat Kocher - chiropractor     Medications: Outpatient Medications Prior to Visit  Medication Sig   buprenorphine-naloxone (SUBOXONE) 8-2 mg SUBL SL tablet Place under the tongue.   docusate sodium (COLACE) 100 MG capsule Take one capsule in the morning, take two capsules at night, each with a full glass of water.   [DISCONTINUED] traZODone (DESYREL) 50 MG tablet Take 1-1.5 tablets (50-75 mg total) by mouth at bedtime as needed for sleep.   No facility-administered medications prior to visit.        Objective    BP 119/72 (BP Location: Right Arm, Patient Position: Sitting, Cuff Size: Normal)    Pulse (!) 59   Temp (!) 97.5 F (36.4 C) (Oral)   Ht 5\' 5"  (1.651 m)   Wt 108 lb (49 kg)   SpO2 100%   BMI 17.97 kg/m     Physical Exam Vitals and nursing note reviewed.  Constitutional:      General: He is not in acute distress.    Appearance: Normal appearance.  HENT:     Head: Normocephalic and atraumatic.  Eyes:     General: No scleral icterus.    Conjunctiva/sclera: Conjunctivae normal.  Cardiovascular:     Rate and Rhythm: Normal rate.  Pulmonary:     Effort: Pulmonary effort is normal.  Neurological:     Mental Status: He is alert and oriented to person, place, and time. Mental status is at baseline.  Psychiatric:        Mood and Affect: Mood normal.        Behavior: Behavior normal.      No results found for any visits on 04/26/23.  Assessment & Plan    Major depressive disorder, recurrent episode with mixed features (HCC) Assessment & Plan: Previously reduced medication dosage due to fatigue but found it insufficient. Prefers to return to the previous higher dosage for better symptom control despite fatigue. Acknowledges need for a pill organizer. - Increase trazodone - Obtain a pill organizer  Orders: -     traZODone HCl; Take 1 tablet (100 mg total) by mouth at bedtime.  Dispense: 90 tablet; Refill:  1  Sleep disturbance Assessment & Plan: Addressed as noted above.  Orders: -     traZODone HCl; Take 1 tablet (100 mg total) by mouth at bedtime.  Dispense: 90 tablet; Refill: 1  Chronic Pain Ongoing pain managed with chiropractic care, providing relief despite intermittent pain. Arranged a Nurse, children's with the chiropractor after exhausting Medicaid-covered options. - Continue chiropractic care - Arrange physical therapy when feasible  General Health Maintenance Declined tetanus (TDAP) vaccine due to concern about injection pain. Reassured that the tetanus vaccine is not typically painful and discussed pain perception differences between tetanus  and MMR vaccines. - Administer tetanus vaccine at next visit if he agrees Patient declined CPE today due to time constraints.   Return for CPE.      I discussed the assessment and treatment plan with the patient  The patient was provided an opportunity to ask questions and all were answered. The patient agreed with the plan and demonstrated an understanding of the instructions.   The patient was advised to call back or seek an in-person evaluation if the symptoms worsen or if the condition fails to improve as anticipated.    Sherlyn Hay, DO  [facility] [phone] (phone) [phone] (fax)  [facility]

## 2023-05-04 ENCOUNTER — Encounter: Payer: Self-pay | Admitting: Family Medicine

## 2023-05-04 NOTE — Assessment & Plan Note (Signed)
 Previously reduced medication dosage due to fatigue but found it insufficient. Prefers to return to the previous higher dosage for better symptom control despite fatigue. Acknowledges need for a pill organizer. - Increase trazodone - Obtain a pill organizer

## 2023-05-04 NOTE — Assessment & Plan Note (Signed)
 Addressed as noted above.

## 2023-06-28 ENCOUNTER — Ambulatory Visit: Payer: MEDICAID | Admitting: Family Medicine

## 2023-08-02 ENCOUNTER — Encounter: Payer: Self-pay | Admitting: Family Medicine

## 2023-08-02 ENCOUNTER — Ambulatory Visit: Payer: MEDICAID | Admitting: Family Medicine

## 2023-08-02 VITALS — BP 113/65 | HR 80 | Resp 14 | Ht 65.0 in | Wt 106.0 lb

## 2023-08-02 DIAGNOSIS — F3342 Major depressive disorder, recurrent, in full remission: Secondary | ICD-10-CM

## 2023-08-02 DIAGNOSIS — G479 Sleep disorder, unspecified: Secondary | ICD-10-CM

## 2023-08-02 MED ORDER — TRAZODONE HCL 100 MG PO TABS: 100.0000 mg | ORAL_TABLET | Freq: Every day | ORAL | 3 refills | Status: AC

## 2023-08-02 NOTE — Progress Notes (Signed)
 Established patient visit   Patient: Manuel Richards   DOB: 1990-08-05   32 y.o. Male  MRN: [id number] Visit Date: 08/02/2023  Today's healthcare provider: Carlean Charter, DO   Chief Complaint  Patient presents with   Follow-up   Subjective    HPI Patient is a 33 year old male with history of sleep disturbance and polysubstance abuse (currently in remission) who presents for follow-up regarding sleep disturbance.  At the last visit, we increased his trazodone  back to 100 mg nightly.  He is doing well on this dose and denies concerns regarding side effects, except for dry mouth.  He does note he would like to be checked for a heart concern that was mentioned to him previously.  He will be requesting records from his previous provider for better information regarding his specific concern.      Medications: Outpatient Medications Prior to Visit  Medication Sig   buprenorphine-naloxone (SUBOXONE) 8-2 mg SUBL SL tablet Place under the tongue.   docusate sodium  (COLACE) 100 MG capsule Take one capsule in the morning, take two capsules at night, each with a full glass of water.   [DISCONTINUED] traZODone  (DESYREL ) 100 MG tablet Take 1 tablet (100 mg total) by mouth at bedtime.   No facility-administered medications prior to visit.    Review of Systems  Respiratory:  Negative for chest tightness, shortness of breath and wheezing.   Cardiovascular:  Negative for chest pain and palpitations.  Gastrointestinal:  Negative for abdominal pain, nausea and vomiting.        Objective    BP 113/65 (BP Location: Right Arm, Patient Position: Sitting, Cuff Size: Normal)   Pulse 80   Resp 14   Ht 5' 5 (1.651 m)   Wt 106 lb (48.1 kg)   SpO2 100%   BMI 17.64 kg/m     Physical Exam Constitutional:      Appearance: Normal appearance.  HENT:     Head: Normocephalic and atraumatic.   Eyes:     General: No scleral icterus.    Extraocular Movements: Extraocular movements  intact.     Conjunctiva/sclera: Conjunctivae normal.    Cardiovascular:     Rate and Rhythm: Normal rate and regular rhythm.     Pulses: Normal pulses.     Heart sounds: Normal heart sounds.  Pulmonary:     Effort: Pulmonary effort is normal. No respiratory distress.     Breath sounds: Normal breath sounds.  Abdominal:     General: Bowel sounds are normal. There is no distension.     Palpations: Abdomen is soft. There is no mass.     Tenderness: There is no abdominal tenderness. There is no guarding.   Musculoskeletal:     Right lower leg: No edema.     Left lower leg: No edema.   Skin:    General: Skin is warm and dry.   Neurological:     Mental Status: He is alert and oriented to person, place, and time. Mental status is at baseline.   Psychiatric:        Mood and Affect: Mood normal.        Behavior: Behavior normal.      No results found for any visits on 08/02/23.  Assessment & Plan    Sleep disturbance -     traZODone  HCl; Take 1 tablet (100 mg total) by mouth at bedtime.  Dispense: 90 tablet; Refill: 3  Recurrent major depression in full remission (HCC)  Will refill patient's trazodone  100 mg today.  Patient will be obtaining records regarding his health concern and will have it sent over and/or bring it over for us  to discuss on his next visit.  I did discuss with him that, the concern at that time may have been related to his previous cocaine use.  Patient is asymptomatic at this time.  Will revisit on follow-up concerns once I have the information from his previous provider. Of note, patient's previous depression is currently in full remission.  He notes that he has not had depressive symptoms since stopping substance abuse over 3 years ago.   Return in about 3 months (around 11/02/2023) for CPE.      I discussed the assessment and treatment plan with the patient  The patient was provided an opportunity to ask questions and all were answered. The patient  agreed with the plan and demonstrated an understanding of the instructions.   The patient was advised to call back or seek an in-person evaluation if the symptoms worsen or if the condition fails to improve as anticipated.    Carlean Charter, DO  [facility] [phone] (phone) [phone] (fax)  [facility]

## 2023-08-16 ENCOUNTER — Other Ambulatory Visit: Payer: Self-pay

## 2023-08-16 ENCOUNTER — Telehealth: Payer: Self-pay | Admitting: Family Medicine

## 2023-08-16 DIAGNOSIS — F17200 Nicotine dependence, unspecified, uncomplicated: Secondary | ICD-10-CM

## 2023-08-16 NOTE — Telephone Encounter (Signed)
 [facility] faxed refill request for the following medications:  Chantix 1 mg cont month box   Please advise.

## 2023-08-17 ENCOUNTER — Telehealth: Payer: Self-pay | Admitting: Family Medicine

## 2023-08-17 NOTE — Telephone Encounter (Signed)
 Copied from CRM [id number]. Topic: Clinical - Medication Refill >> Aug 17, 2023  3:38 PM Elle L wrote: Medication: The patient states Dr. Donzella prescribed him Chantix (varenicline) 1 mg twice a day but I did not see this reflected in his chart.   Has the patient contacted their pharmacy? Yes, the Pharmacy advised him to reach out to his provider.  This is the patient's preferred pharmacy:  [facility] [address] [facility] [place], [place] - [address] AT [facility] [address] [place] [address] Phone: [phone] Fax: [phone]  Is this the correct pharmacy for this prescription? Yes  Has the prescription been filled recently? Yes 04/26/23 per the patient.   Is the patient out of the medication? No, a couple left.   Has the patient been seen for an appointment in the last year OR does the patient have an upcoming appointment? Yes  Can we respond through MyChart? Yes  Agent: Please be advised that Rx refills may take up to 3 business days. We ask that you follow-up with your pharmacy.

## 2023-08-22 MED ORDER — VARENICLINE TARTRATE 0.5 MG PO TABS: ORAL_TABLET | ORAL | 0 refills | Status: AC

## 2023-08-22 MED ORDER — VARENICLINE TARTRATE 1 MG PO TABS: 1.0000 mg | ORAL_TABLET | Freq: Two times a day (BID) | ORAL | 1 refills | Status: AC

## 2023-11-02 ENCOUNTER — Encounter: Payer: MEDICAID | Admitting: Family Medicine

## 2023-12-02 ENCOUNTER — Ambulatory Visit: Payer: MEDICAID | Attending: Family Medicine | Admitting: Physical Therapy

## 2023-12-02 NOTE — Therapy (Deleted)
 OUTPATIENT PHYSICAL THERAPY EVALUATION   Patient Name: Manuel Richards MRN: [id number] DOB:08-08-1990, 33 y.o., male Today's Date: 12/02/2023  END OF SESSION:   Past Medical History:  Diagnosis Date   ADHD (attention deficit hyperactivity disorder)    Allergy    Anxiety    Drug use    No past surgical history on file. Patient Active Problem List   Diagnosis Date Noted   Chronic right-sided low back pain without sciatica 03/02/2023   Sleep disturbance 03/02/2023   Degenerative disc disease at L5-S1 level 03/02/2023   Upper respiratory infection, viral 12/14/2022   Bulging lumbar disc 12/04/2022   Chronic constipation 12/04/2022   Aortic atherosclerosis 03/08/2022   MDD (major depressive disorder), recurrent, severe, with psychosis (HCC) 03/08/2018   Recurrent major depression in full remission 11/19/2017   Sedative, hypnotic or anxiolytic use disorder, severe, dependence (HCC) 08/12/2016   Cocaine use disorder, moderate, in early remission, dependence (HCC) 08/11/2016   Substance-induced psychotic disorder with hallucinations (HCC) 12/19/2014   Opioid use disorder, moderate, in early remission, on maintenance therapy (HCC) 12/19/2014   Cannabis use disorder, moderate, dependence (HCC) 12/19/2014   Nicotine  dependence in remission 12/19/2014    PCP: Donzella Lauraine SAILOR, DO  REFERRING PROVIDER: Donzella Lauraine SAILOR, DO  REFERRING DIAG: chronic right-sided low back pain without sciatica, degenerative disc disease at L5-S1 level  Rationale for Evaluation and Treatment: Rehabilitation  THERAPY DIAG:  No diagnosis found.  ONSET DATE: ***  SUBJECTIVE:                                                                                                                                                                                           SUBJECTIVE STATEMENT: Patient is here for a physical therapy evaluation for chronic low back pain.   Per DO note on 02/24/2023:   The patient  also reports persistent back pain, described as a dull ache located in the lower back region. The pain is constant, with occasional flare-ups, and does not radiate down the legs. The patient has tried a five-day course of naproxen  without any noticeable change in pain levels. He has also been performing exercises, including sit-ups, to strengthen his back, which has resulted in a slight improvement in symptoms.    The patient works a physically demanding job at [employer], which he believes may contribute to his back pain. He has been making efforts to use proper lifting techniques and has been performing exercises to strengthen his back. Despite these efforts, the back pain persists. He denies presence of any numbness/tingling.   The patient has a history of a mild broad-based bulging disc at  L5 to S1, which was identified on a previous renal CT scan. He expresses interest in pursuing physical therapy and chiropractic care for his back pain  What are your expectations for today? *** Manner of onset (traumatic, sudden, insidious): ***. When did it start? *** Related signs and symptoms: *** Previous episodes: *** Occupation: *** Recreational Activities and hobbies: *** Functional limitations: ***  PAIN: Are you having pain? Yes NPRS: Current: ***/10,  Best: ***/10, Worst: ***/10. Pain location: *** Pain description: *** . Numbness/tingling: *** Aggravating factors: *** Relieving factors: ***  24 hour clock: *** Irritability of the pain or symptoms:  Time to onset (T1): ***,  Time to quit (T2): *** Time to feel better (T3): ***   PERTINENT HISTORY:  ***  PAIN:  Are you having pain? {OPRCPAIN:27236}  PRECAUTIONS: {Therapy precautions:24002}  WEIGHT BEARING RESTRICTIONS: {Yes ***/No:24003}  FALLS:  Has patient fallen in last 6 months? {fallsyesno:27318}  LIVING ENVIRONMENT: Lives with: {OPRC lives with:25569::lives with their family} Lives in: {Lives in:25570} Stairs:  {opstairs:27293} Has following equipment at home: {Assistive devices:23999}  PLOF: {PLOF:24004}  PATIENT GOALS: ***  NEXT MD VISIT: ***  OBJECTIVE  DIAGNOSTIC FINDINGS:  *** None available in chart.   SELF-REPORTED FUNCTION {PROS:32229}  STANDING Observation Posture Seated:  Standing: Movement patterns and painful movements Sit <> stand:  Bed mobility:  Other:  AROM Lumbar AROM Flexion: *** Extension: *** Side Flexion: R: *** L: *** Rotation: R: *** L: *** Side glide:  R: *** L: ***  Traction Alleviation Test *** Standing Lower Myotome Screen Single leg stance heel raise 1x10 with B UE hand held support:  R:  L:  Walking Regionalization (as needed) Flexion:  Sidebending:  R:  L:   SITTING Neurological Testing Neural Tension Testing Slump Flexion based  R: *** L: *** Extension based  R: L:  Reflexes if high load sensitivity Patellar Reflex (L3-L4):  R: *** L: ***  Hamstring Reflex (L5) R: *** L: ***  Achilles Reflex (S1) R: *** L: ***  SUPINE Neurological Testing Neural Tension Testing Straight Leg Raise:  From supine R: *** L: *** With pre-flexed hip/knee R: *** L: *** Bowstring test:  R: *** L: *** Reflex Testing Patellar Reflex (L3-L4):  R: *** L: ***  Place bolster under knees and grasp patellar tendon, induce reflex at patellar tendon  Alternative: Support distal thighs with arm under knees, induce reflex at patellar tendon  Hamstring Reflex (L5) R: *** L: ***  Place bolster under knees and place hip in slight ER, grasp medial HS tendon and compress, then add stretch by dragging distally, strike fingers directly over tendon.  Alternative: Support distal thigh with one knee under the patient's ipsilateral knee, position hip in slight ER, grasp medial HS tendon and compress, then add stretch by dragging distally, strike fingers directly over tendon.  Achilles Reflex (S1) R: *** L: ***  Place bolster under knees   Hold by lateral (5th), MTP joint of foot, lift enough to free heel from table  ? Stand to see area of strike (next to foot, not behind)  ? Strike lateral edge of foot just below fingers for symmetrical strike  Alternative: Preposition into 90/90 hip/knee flexion  ? Rest leg on outside of forearm  ? Hold by lateral (5th), MTP joint of foot  ? Stand to see area of strike (next to foot, not behind)  ? Strike lateral edge of foot just below fingers for symmetrical strike  Adductor R: *** L: ***  Knee bent, externally rotated and abducted  ? Tendon is fairly high on adductor  ? Push in with thumb and strike thumb with hammer  ? Can resist adduction to make tendon pop to find sweet spot  Myotome Testing Cleanest Lumbar Primary and Secondary Myotomes for testing  L3  Quad   L4  Anterior Tibialis  Test quadriceps   L5  EHL  Test hamstrings   S1  Ankle Eversion  Test hip abductors   S2  Gastrocnemius   Ankle Eversion (S1) R: *** L: ***   ? Stand on the same side of the patient's testing LE ? Patient slides to the edge of the table ? Caudal arm cups the patient's foot (fingers on plantar surface of foot, thumb on dorsal foot), shape hand to foot to increase contact area ? Cranial arm cradles along patient's medial lower leg to stabilize with break testing ? Tuck caudal arm's elbow into hip, square legs with shoulders, toes pointing out ? Ask the patient to move outside of foot out toward you ? "123, Hold" ? Use lunge stance (push off the back heel) to perform the break test in direction of inversion Anterior Tibialis (L4-L5) R: *** L: ***   ? Stand on the opposite side of the patient's testing LE ? Patient slides to the edge of the table ? Cranial arm cradles along patient's lateral lower leg to stabilize with break testing ? Caudal arm cups the patient's foot (fingers on plantar surface of foot, thumb on dorsal foot), shape hand to foot to increase contact area ? Tuck the caudal  arm's elbow into side, square legs with shoulders, toes pointing out ? Ask the patient to move ankle up and inward ? "123, Hold" ? Use lunge stance (push off the back heel) to perform break test in direction of PF and eversion Extensor Hallucis Longus (L5) R: *** L: ***   ? Can test both simultaneously ? Stand at the foot of the table ? Ask the patient to pull great toes up towards shin ? "123, hold" ? Perform brake test in the direction of toe flexion by use thumbs to pull down into flexion with 1st finger curled and stabilizing on ball of foot ? If unclean, test unilaterally and use thenar to break one at a time. Additional Myotome Testing Quadriceps (L3) R: *** L: ***   ? If anterior tibialis is weak (L4), assess level above for involvement, if tolerated ? Especially important if the patient reports intermittent buckling of the LE and demonstrates weakness with anterior tibialis ? NOT a break test ? Safety test for falls ? Seated, popliteal space against table for fulcrum, patient leaning back onto hands ? "123, hold" ? Stabilize at hip and press down on ankle by dropping body weight Hamstring (L5) R: *** L: ***   ? Can test if weakness noted with EHL ? Supine, stand on the same side as the testing LE ? Bring the LE (passively) into hip flexion, and max knee flexion ? Hold the patient's knee and cup the patient's ankle ? Ask the patient to don't let you pull the foot up ? Pull into knee extension ? looking for increase in strength like biceps ? NOTE: Caution with cramping ? Hip Abductors (L5, S1) R: *** L: ***   ? Can test if weakness noted with ankle eversion ? Side Lying with hip in neutral rotation, slight extension ? Resist hip abduction ? Stand in front to see patients face  SIDELYING Palpation Muscle irritability and guarding QL, multifidi, erector spinae, glute medius (cranial and posterior to greater trochanter)  PRONE Prone Iliopsoas Length Assessment  (manual)  R: *** (stiffness and end feel) L: *** ? Patient prone, both legs on table,  ? Pillow under belly, above ASIS  ? Stand on the opposite side of table, wide stance, leaning on table  ? Stabilize with ulnar border of cranial hand at ischial tuberosity  ? Caudal hand grasps under patient's distal thigh  ? Compress and drag ischium inferiorly ? lift thigh into extension  ? Assess for end-feel and compare to opposite side  To find the ischial tuberosity, just go below gluteal fold and press in medially (more medial than you think)  ? If you don't stabilize well at ischium, you'll get lumbar extension.  ? A normal muscular end feel should be elastic  ? Common to find a dysfunctional, quicker, firmer stop in psoas. This results in pulling on the spine.  Anterior capsule differentiation with sidebending as needed  Prone Iliopsoas Length (with table)  R: *** L: *** For big heavy legs  ? Patient prone, pillow above ASIS, gluteal fold at the crease of the table  ? Opposite LE off the side of the table with hip/knee flexed, foot under hip (use a wedge or your foot to stabilize the patient's foot on the ground)  ? If the hip rolls up off the table, then it's stiff.  ? If hip is down still, Lift the end of the table until hip comes up (watch the pelvis and spine for extension)  ? Use ulnar border of cranial hand reinforced by caudal hand to direct anterior/inferior force at proximal femur  ? Assess for end-feel and compare to opposite side    NOTE: Less perceivable stretch sensation of RF than psoas is common  ? Psoas can be a source of pain.  ? Usually described at lower abdominal or groin pain when walking, running, or doing anything that uses psoas.  ? You can palpate psoas, no one will like it, but if it is the source of pain, it will be terrible.    TREATMENT                                                                                                                               PATIENT EDUCATION:  Education details: *** Person educated: {Person educated:25204} Education method: {Education Method:25205} Education comprehension: {Education Comprehension:25206}  HOME EXERCISE PROGRAM: ***  ASSESSMENT:  CLINICAL IMPRESSION: Patient is a 33 y.o. male referred to outpatient physical therapy with a medical diagnosis of chronic right-sided low back pain without sciatica, degenerative disc disease at L5-S1 level who presents with signs and symptoms consistent with ***. Patient presents with significant *** impairments that are limiting ability to complete *** without difficulty. Patient will benefit from skilled physical therapy intervention to address current body structure impairments and activity limitations to improve function and work  towards goals set in current POC in order to return to prior level of function or maximal functional improvement.   Mechanical sensitivities: ***   OBJECTIVE IMPAIRMENTS: {opptimpairments:25111}.   ACTIVITY LIMITATIONS: {activitylimitations:27494}  PARTICIPATION LIMITATIONS: {participationrestrictions:25113}  PERSONAL FACTORS: {Personal factors:25162} are also affecting patient's functional outcome.   REHAB POTENTIAL: {rehabpotential:25112}  CLINICAL DECISION MAKING: {clinical decision making:25114}  EVALUATION COMPLEXITY: {Evaluation complexity:25115}   GOALS: Goals reviewed with patient? {yes/no:20286}  SHORT TERM GOALS: Target date: 12/16/2023  Patient will be independent with initial home exercise program for self-management of symptoms. Baseline: {HEPbaseline4:27310} (12/02/23); Goal status: INITIAL  LONG TERM GOALS: Target date: 02/24/2024  Patient will be independent with a long-term home exercise program for self-management of symptoms.  Baseline: {HEPbaseline4:27310} (12/02/23); Goal status: INITIAL  2.  Patient will demonstrate improved {SarasLTGPRO:32233} to demonstrate improvement in overall  condition and self-reported functional ability.  Baseline: {Sarasgoalbaseline:32234} (12/02/23); Goal status: INITIAL  3.  *** Baseline: {Sarasgoalbaseline:32234} (12/02/23); Goal status: INITIAL  4.  *** Baseline: {Sarasgoalbaseline:32234} (12/02/23); Goal status: INITIAL  5.  Patient will demonstrate improvement in Patient Specific Functional Scale (PSFS) of equal or greater than 8/10 points to reflect clinically significant improvement in patient's most valued functional activities. Baseline: {Sarasgoalbaseline:32234} (12/02/23); Goal status: INITIAL  6.  Patient will report NPRS equal or less than 3/10 during functional activities during the last 2 weeks to improve their abilitly to complete community, work and/or recreational activities with less limitation. Baseline: ***/10 (12/02/23); Goal status: INITIAL   PLAN:  PT FREQUENCY: {rehab frequency:25116}  PT DURATION: {rehab duration:25117}  PLANNED INTERVENTIONS: {rehab planned interventions:25118::97110-Therapeutic exercises,97530- Therapeutic [phone]- Neuromuscular re-education,97535- Self Rjmz,02859- Manual therapy,Patient/Family education}.  PLAN FOR NEXT SESSION: ***  Camie SAUNDERS. Juli, PT, DPT, Cert. MDT 12/02/23, 12:53 PM  [facility] [address] [place], [place] [address] P: [phone] I F: [phone]

## 2023-12-06 ENCOUNTER — Ambulatory Visit: Payer: MEDICAID | Admitting: Physical Therapy

## 2023-12-08 ENCOUNTER — Ambulatory Visit: Payer: MEDICAID | Admitting: Physical Therapy

## 2023-12-13 ENCOUNTER — Ambulatory Visit: Payer: MEDICAID

## 2023-12-14 ENCOUNTER — Ambulatory Visit: Payer: MEDICAID | Admitting: Physical Therapy

## 2023-12-15 ENCOUNTER — Ambulatory Visit: Payer: MEDICAID

## 2023-12-20 ENCOUNTER — Ambulatory Visit: Payer: MEDICAID | Admitting: Physical Therapy

## 2023-12-21 ENCOUNTER — Ambulatory Visit: Payer: MEDICAID

## 2023-12-22 ENCOUNTER — Ambulatory Visit: Payer: MEDICAID | Admitting: Physical Therapy

## 2023-12-27 ENCOUNTER — Ambulatory Visit: Payer: MEDICAID | Admitting: Physical Therapy

## 2023-12-27 ENCOUNTER — Ambulatory Visit: Payer: MEDICAID

## 2023-12-29 ENCOUNTER — Ambulatory Visit: Payer: MEDICAID

## 2023-12-29 ENCOUNTER — Ambulatory Visit: Payer: MEDICAID | Admitting: Physical Therapy

## 2024-01-03 ENCOUNTER — Ambulatory Visit: Payer: MEDICAID | Admitting: Physical Therapy

## 2024-01-03 ENCOUNTER — Ambulatory Visit: Payer: MEDICAID

## 2024-01-05 ENCOUNTER — Ambulatory Visit: Payer: MEDICAID | Admitting: Physical Therapy

## 2024-01-05 ENCOUNTER — Ambulatory Visit: Payer: MEDICAID

## 2024-01-10 ENCOUNTER — Ambulatory Visit: Payer: MEDICAID | Admitting: Physical Therapy

## 2024-01-10 ENCOUNTER — Ambulatory Visit: Payer: MEDICAID

## 2024-01-12 ENCOUNTER — Ambulatory Visit: Payer: MEDICAID

## 2024-01-12 ENCOUNTER — Ambulatory Visit: Payer: MEDICAID | Admitting: Physical Therapy

## 2024-01-18 ENCOUNTER — Ambulatory Visit: Payer: MEDICAID | Admitting: Physical Therapy

## 2024-01-18 ENCOUNTER — Ambulatory Visit: Payer: MEDICAID

## 2024-04-26 ENCOUNTER — Encounter: Payer: MEDICAID | Admitting: Family Medicine
